# Patient Record
Sex: Female | Born: 1950
Health system: Southern US, Community
[De-identification: ages and names within clinical notes are randomized; demographics above are authoritative.]

## PROBLEM LIST (undated history)

## (undated) DIAGNOSIS — G43909 Migraine, unspecified, not intractable, without status migrainosus: Secondary | ICD-10-CM

## (undated) DIAGNOSIS — M51369 Other intervertebral disc degeneration, lumbar region without mention of lumbar back pain or lower extremity pain: Secondary | ICD-10-CM

## (undated) DIAGNOSIS — M5136 Other intervertebral disc degeneration, lumbar region: Secondary | ICD-10-CM

## (undated) DIAGNOSIS — K219 Gastro-esophageal reflux disease without esophagitis: Secondary | ICD-10-CM

## (undated) DIAGNOSIS — E7800 Pure hypercholesterolemia, unspecified: Secondary | ICD-10-CM

## (undated) DIAGNOSIS — Z98811 Dental restoration status: Secondary | ICD-10-CM

## (undated) DIAGNOSIS — M1990 Unspecified osteoarthritis, unspecified site: Secondary | ICD-10-CM

## (undated) DIAGNOSIS — C50511 Malignant neoplasm of lower-outer quadrant of right female breast: Principal | ICD-10-CM

## (undated) DIAGNOSIS — R29898 Other symptoms and signs involving the musculoskeletal system: Secondary | ICD-10-CM

## (undated) DIAGNOSIS — I480 Paroxysmal atrial fibrillation: Secondary | ICD-10-CM

## (undated) DIAGNOSIS — IMO0002 Reserved for concepts with insufficient information to code with codable children: Secondary | ICD-10-CM

## (undated) DIAGNOSIS — IMO0001 Reserved for inherently not codable concepts without codable children: Secondary | ICD-10-CM

## (undated) DIAGNOSIS — G4730 Sleep apnea, unspecified: Secondary | ICD-10-CM

## (undated) HISTORY — DX: Reserved for inherently not codable concepts without codable children: IMO0001

## (undated) HISTORY — DX: Malignant neoplasm of lower-outer quadrant of right female breast: C50.511

## (undated) HISTORY — PX: DILATION AND CURETTAGE OF UTERUS: SHX78

## (undated) HISTORY — DX: Reserved for concepts with insufficient information to code with codable children: IMO0002

---

## 1997-11-17 ENCOUNTER — Other Ambulatory Visit: Admission: RE | Admit: 1997-11-17 | Discharge: 1997-11-17 | Payer: Self-pay | Admitting: Gynecology

## 1998-12-08 ENCOUNTER — Other Ambulatory Visit: Admission: RE | Admit: 1998-12-08 | Discharge: 1998-12-08 | Payer: Self-pay | Admitting: Gynecology

## 1999-05-30 ENCOUNTER — Other Ambulatory Visit: Admission: RE | Admit: 1999-05-30 | Discharge: 1999-05-30 | Payer: Self-pay | Admitting: Gynecology

## 2000-06-13 ENCOUNTER — Other Ambulatory Visit: Admission: RE | Admit: 2000-06-13 | Discharge: 2000-06-13 | Payer: Self-pay | Admitting: Gynecology

## 2000-07-05 ENCOUNTER — Other Ambulatory Visit: Admission: RE | Admit: 2000-07-05 | Discharge: 2000-07-05 | Payer: Self-pay | Admitting: Gynecology

## 2001-06-17 ENCOUNTER — Other Ambulatory Visit: Admission: RE | Admit: 2001-06-17 | Discharge: 2001-06-17 | Payer: Self-pay | Admitting: Gynecology

## 2001-07-03 ENCOUNTER — Encounter: Admission: RE | Admit: 2001-07-03 | Discharge: 2001-10-01 | Payer: Self-pay | Admitting: Gynecology

## 2002-08-06 ENCOUNTER — Other Ambulatory Visit: Admission: RE | Admit: 2002-08-06 | Discharge: 2002-08-06 | Payer: Self-pay | Admitting: *Deleted

## 2003-04-06 ENCOUNTER — Ambulatory Visit (HOSPITAL_COMMUNITY): Admission: RE | Admit: 2003-04-06 | Discharge: 2003-04-06 | Payer: Self-pay | Admitting: Orthopedic Surgery

## 2003-04-06 ENCOUNTER — Ambulatory Visit (HOSPITAL_BASED_OUTPATIENT_CLINIC_OR_DEPARTMENT_OTHER): Admission: RE | Admit: 2003-04-06 | Discharge: 2003-04-06 | Payer: Self-pay | Admitting: Orthopedic Surgery

## 2003-04-06 HISTORY — PX: KNEE ARTHROSCOPY: SHX127

## 2003-04-21 ENCOUNTER — Ambulatory Visit (HOSPITAL_COMMUNITY): Admission: RE | Admit: 2003-04-21 | Discharge: 2003-04-21 | Payer: Self-pay | Admitting: Orthopedic Surgery

## 2003-05-13 ENCOUNTER — Encounter (INDEPENDENT_AMBULATORY_CARE_PROVIDER_SITE_OTHER): Payer: Self-pay | Admitting: Specialist

## 2003-05-13 ENCOUNTER — Ambulatory Visit (HOSPITAL_COMMUNITY): Admission: RE | Admit: 2003-05-13 | Discharge: 2003-05-13 | Payer: Self-pay | Admitting: Orthopedic Surgery

## 2003-05-13 ENCOUNTER — Ambulatory Visit (HOSPITAL_BASED_OUTPATIENT_CLINIC_OR_DEPARTMENT_OTHER): Admission: RE | Admit: 2003-05-13 | Discharge: 2003-05-13 | Payer: Self-pay | Admitting: Orthopedic Surgery

## 2003-05-13 HISTORY — PX: CARPAL TUNNEL RELEASE: SHX101

## 2003-09-03 ENCOUNTER — Other Ambulatory Visit: Admission: RE | Admit: 2003-09-03 | Discharge: 2003-09-03 | Payer: Self-pay | Admitting: Gynecology

## 2003-11-30 ENCOUNTER — Ambulatory Visit (HOSPITAL_COMMUNITY): Admission: RE | Admit: 2003-11-30 | Discharge: 2003-11-30 | Payer: Self-pay | Admitting: Orthopedic Surgery

## 2003-11-30 ENCOUNTER — Ambulatory Visit (HOSPITAL_BASED_OUTPATIENT_CLINIC_OR_DEPARTMENT_OTHER): Admission: RE | Admit: 2003-11-30 | Discharge: 2003-11-30 | Payer: Self-pay | Admitting: Orthopedic Surgery

## 2003-11-30 HISTORY — PX: KNEE ARTHROSCOPY: SHX127

## 2004-07-08 ENCOUNTER — Ambulatory Visit (HOSPITAL_COMMUNITY): Admission: RE | Admit: 2004-07-08 | Discharge: 2004-07-08 | Payer: Self-pay | Admitting: Gastroenterology

## 2004-09-08 ENCOUNTER — Other Ambulatory Visit: Admission: RE | Admit: 2004-09-08 | Discharge: 2004-09-08 | Payer: Self-pay | Admitting: Gynecology

## 2005-09-11 ENCOUNTER — Other Ambulatory Visit: Admission: RE | Admit: 2005-09-11 | Discharge: 2005-09-11 | Payer: Self-pay | Admitting: Gynecology

## 2006-09-14 ENCOUNTER — Other Ambulatory Visit: Admission: RE | Admit: 2006-09-14 | Discharge: 2006-09-14 | Payer: Self-pay | Admitting: Gynecology

## 2007-10-28 ENCOUNTER — Other Ambulatory Visit: Admission: RE | Admit: 2007-10-28 | Discharge: 2007-10-28 | Payer: Self-pay | Admitting: Gynecology

## 2008-10-26 ENCOUNTER — Other Ambulatory Visit: Admission: RE | Admit: 2008-10-26 | Discharge: 2008-10-26 | Payer: Self-pay | Admitting: Family Medicine

## 2010-09-30 NOTE — Op Note (Signed)
NAME:  Brandi Rodgers, Brandi Rodgers                      ACCOUNT NO.:  [id number]   MEDICAL RECORD NO.:  [id number]                   PATIENT TYPE:  AMB   LOCATION:  DSC                                  FACILITY:  [facility]   PHYSICIAN:  John L. Rendall III, M.D.           DATE OF BIRTH:  [birth year]   DATE OF PROCEDURE:  05/13/2003  DATE OF DISCHARGE:                                 OPERATIVE REPORT   PREOPERATIVE DIAGNOSIS:  Severe right carpal tunnel syndrome.   POSTOPERATIVE DIAGNOSIS:  Severe right carpal tunnel syndrome plus chronic  thickened tenosynovium.   SURGICAL PROCEDURE:  Carpal tunnel release with flexor tenosynovectomy.   SURGEON:  John L. Rendall, M.D.   ANESTHESIA:  Bier block at forearm level.   FINDINGS:  Flattened, erythematous median nerve with thick tenosynovium  around flexor tendons, filling the carpal tunnel.   PROCEDURE:  Under Bier block anesthesia, the right hand and wrist are  prepared with Duraprep.  After 10 minutes of block, a thenar crease incision  is made, curving ulnarwards at the wrist flexion crease and proximally about  5 mm towards the palmaris longus.  Dissection is done with 3.5-power  magnification.  Dissection is carried through skin and subcutaneous tissue  to the flexor retinaculum.  This is split longitudinally, parallel to the  palmaris longus.  A Freer elevator is inserted.  With this in place beneath  the volar carpal ligament, dissection is carried from proximally distally,  splitting subcutaneous tissue and volar carpal ligament.  Once the volar  carpal ligament is split, the median nerve is immediately identified.  It is  flattened and erythematous, encased in a thick tenosynovium.  It is gently  dissected and retracted to the radial side.  A tenosynovectomy is then  carried out to debulk the carpal tunnel space.  Once this is done, the depth  of the carpal tunnel is manually palpated to make sure there are no  obstructing lesions such as a  ganglion.  Significant blockage is felt at the  wrist flexion crease by the proximal flexor retinaculum.  This is then split  subcutaneously an additional 1 inch, with care taken to protect the median  nerve.  Once this is completed, multiple small vessels are cauterized.  The  wrist flexion crease is reapproximated with 3-0 Vicryl.  Skin is then closed  with mattress sutures of 4-0 nylon.  A sterile dressing and volar plaster  splint are approximated after first injecting with Marcaine 0.5% with  morphine.  The patient tolerated the procedure well and returned to recovery  in good condition.                                               John L. Dorothyann Gibbs, M.D.    Renato Gails  D:  05/13/2003  T:  05/13/2003  Job:  [id number]

## 2010-09-30 NOTE — Op Note (Signed)
NAME:  Brandi Rodgers, Brandi Rodgers                      ACCOUNT NO.:  [id number]   MEDICAL RECORD NO.:  [id number]                   PATIENT TYPE:  AMB   LOCATION:  DSC                                  FACILITY:  [facility]   PHYSICIAN:  John L. Rendall III, M.D.           DATE OF BIRTH:  1950/06/15   DATE OF PROCEDURE:  04/06/2003  DATE OF DISCHARGE:                                 OPERATIVE REPORT   PREOPERATIVE DIAGNOSIS:  Torn right medial meniscus.   POSTOPERATIVE DIAGNOSIS:  Complex tear lateral  meniscus and peeling hilum  cartilage from medial femoral condyle.   PROCEDURE:  Arthroscopic medial chondroplasty and lateral  meniscectomy.   SURGEON:  John L. Rendall, M.D.   ANESTHESIA:  MAC.   INDICATIONS FOR PROCEDURE:  Chronic medial right knee pain, crepitation,  grinding, swelling, worse with weightbearing. Justification for outpatient  setting, inpatient not required.   DESCRIPTION OF PROCEDURE:  Under MAC anesthesia the right knee was prepared  with Betadine and draped in a sterile field to the leg holder. Standard  arthroscopic portals were made with findings of over  1 x 1.5 cm patch of peeling hilum cartilage in the central weightbearing  area of the medial femoral condyle.   Using a curved Merlin shaver, this was resected and smoothed back to stable  meniscal cartilage. The meniscal rim underneath was intact. In the lateral  compartment, however, there was a complex posterior  horn tear of the  lateral  meniscus. Using a combination of basket forceps and shaver through  both medial and lateral portals, this was resected back to stable meniscal  rim. The debris was removed with a shaver and irrigation system.   Once this was completed, the knee was copiously irrigated with saline and  infiltrated with Marcaine with morphine with epinephrine and a sterile  compression bandage was applied. The patient returned to recovery in good  condition.                John L. Dorothyann Gibbs, M.D.    Renato Gails  D:  04/06/2003  T:  04/06/2003  Job:  [id number]

## 2010-09-30 NOTE — Op Note (Signed)
NAME:  Brandi Rodgers, Brandi Rodgers                      ACCOUNT NO.:  [id number]   MEDICAL RECORD NO.:  [id number]                   PATIENT TYPE:  AMB   LOCATION:  DSC                                  FACILITY:  [facility]   PHYSICIAN:  John L. Rendall III, M.D.           DATE OF BIRTH:  [birth year]   DATE OF PROCEDURE:  11/30/2003  DATE OF DISCHARGE:                                 OPERATIVE REPORT   INDICATIONS FOR PROCEDURE:  Left knee pain following a twisting injury to  the knee with a feeling of bone grinding off and on for now two months.  She  has medial and lateral joint line pain, crepitance deep in the knee.   PREOPERATIVE DIAGNOSES:  Internal derangement, left knee, with probable  complex tear of both menisci.   OPERATION:  Partial medial and lateral meniscectomy with patellofemoral  chondroplasty.   POSTOPERATIVE DIAGNOSES:  Degenerative tear medial and lateral menisci,  posterior horn, with patellofemoral arthritis.   SURGEON:  John L. Rendall, M.D.   ANESTHESIA:  MAC.   DESCRIPTION OF PROCEDURE:  Under MAC anesthesia, the left knee is prepared  with Betadine and draped as a sterile field with the leg holder.  Standard  arthroscopic portals are made, and the findings of a posterior horn flap  tear of the medial meniscus was found, posterior horn degenerative tear of  the medial meniscus, feeling hyaline cartilage on the medial femoral  condyle, and feeling hyaline cartilage in the femoral trochlea and inferior  and lateral portion of the patella.  Starting laterally, a straight basket  is used and a lateral meniscectomy is carried out in the posterior horn.  An  intra-articular shaver is used to remove debris and to clean out the  compartment.  On the medial compartment, peeling hyaline cartilage is taken  down with the curved Merlin shaver, and the posterior horn is resected back  to stable meniscal rim.  Long fibrillating crabgrass in the base of the  femoral trochlea is  debrided with a especially-curved Merlin shaver.  The  undersurface of the patella is also shaved through both medial and lateral  portals.  Once this is all completed, the punctures are closed with #4-0  nylon.  The knee is infiltrated with Marcaine with morphine with  epinephrine.  A sterile compression bandage is applied.  The patient returned to the recovery room in good condition.                                               John L. Dorothyann Gibbs, M.D.    Renato Gails  D:  11/30/2003  T:  11/30/2003  Job:  [id number]

## 2011-03-17 ENCOUNTER — Encounter: Payer: Self-pay | Admitting: Gynecology

## 2012-01-12 ENCOUNTER — Other Ambulatory Visit: Payer: Self-pay | Admitting: Family Medicine

## 2012-01-12 ENCOUNTER — Other Ambulatory Visit (HOSPITAL_COMMUNITY)
Admission: RE | Admit: 2012-01-12 | Discharge: 2012-01-12 | Disposition: A | Discharge status: Home / Self Care | Payer: BC Managed Care – PPO | Source: Ambulatory Visit | Attending: Family Medicine | Admitting: Family Medicine

## 2012-01-12 DIAGNOSIS — Z124 Encounter for screening for malignant neoplasm of cervix: Secondary | ICD-10-CM | POA: Insufficient documentation

## 2012-01-12 DIAGNOSIS — Z1151 Encounter for screening for human papillomavirus (HPV): Secondary | ICD-10-CM | POA: Insufficient documentation

## 2012-06-11 ENCOUNTER — Other Ambulatory Visit: Payer: Self-pay | Admitting: Orthopaedic Surgery

## 2012-06-11 DIAGNOSIS — M545 Low back pain, unspecified: Secondary | ICD-10-CM

## 2012-06-18 ENCOUNTER — Ambulatory Visit
Admission: RE | Admit: 2012-06-18 | Discharge: 2012-06-18 | Disposition: A | Discharge status: Home / Self Care | Payer: BC Managed Care – PPO | Source: Ambulatory Visit | Attending: Orthopaedic Surgery | Admitting: Orthopaedic Surgery

## 2012-06-18 DIAGNOSIS — M545 Low back pain, unspecified: Secondary | ICD-10-CM

## 2012-10-21 ENCOUNTER — Other Ambulatory Visit: Payer: Self-pay | Admitting: Dermatology

## 2014-01-29 ENCOUNTER — Emergency Department (HOSPITAL_COMMUNITY)
Admission: EM | Admit: 2014-01-29 | Discharge: 2014-01-29 | Disposition: A | Discharge status: Home / Self Care | Payer: BC Managed Care – PPO | Attending: Emergency Medicine | Admitting: Emergency Medicine

## 2014-01-29 ENCOUNTER — Emergency Department (HOSPITAL_COMMUNITY): Payer: BC Managed Care – PPO

## 2014-01-29 ENCOUNTER — Encounter (HOSPITAL_COMMUNITY): Payer: Self-pay | Admitting: Emergency Medicine

## 2014-01-29 DIAGNOSIS — Z8679 Personal history of other diseases of the circulatory system: Secondary | ICD-10-CM | POA: Diagnosis not present

## 2014-01-29 DIAGNOSIS — M7989 Other specified soft tissue disorders: Secondary | ICD-10-CM | POA: Insufficient documentation

## 2014-01-29 DIAGNOSIS — R002 Palpitations: Secondary | ICD-10-CM

## 2014-01-29 DIAGNOSIS — Z9889 Other specified postprocedural states: Secondary | ICD-10-CM | POA: Diagnosis not present

## 2014-01-29 LAB — CBC
HCT: 39.5 % (ref 36.0–46.0)
HEMOGLOBIN: 13.4 g/dL (ref 12.0–15.0)
MCH: 29.3 pg (ref 26.0–34.0)
MCHC: 33.9 g/dL (ref 30.0–36.0)
MCV: 86.4 fL (ref 78.0–100.0)
Platelets: 208 10*3/uL (ref 150–400)
RBC: 4.57 MIL/uL (ref 3.87–5.11)
RDW: 14 % (ref 11.5–15.5)
WBC: 8.4 10*3/uL (ref 4.0–10.5)

## 2014-01-29 LAB — BASIC METABOLIC PANEL
ANION GAP: 10 (ref 5–15)
BUN: 12 mg/dL (ref 6–23)
CALCIUM: 10 mg/dL (ref 8.4–10.5)
CHLORIDE: 107 meq/L (ref 96–112)
CO2: 30 meq/L (ref 19–32)
Creatinine, Ser: 0.7 mg/dL (ref 0.50–1.10)
GFR calc Af Amer: >90 mL/min (ref >90)
GFR calc non Af Amer: >90 mL/min (ref >90)
GLUCOSE: 113 mg/dL — AB (ref 70–99)
Potassium: 4.4 mEq/L (ref 3.7–5.3)
SODIUM: 147 meq/L (ref 137–147)

## 2014-01-29 LAB — TROPONIN I

## 2014-01-29 NOTE — ED Notes (Signed)
Pt. Given a box lunch and diet coke

## 2014-01-29 NOTE — ED Provider Notes (Signed)
63 year old female has been treated for vertigo and was in seeing her doctor today when she noted that her heart was beating rapidly. Physician did an ECG which showed atrial fibrillation and she was referred to the ED. Rapid heartbeat stopped before she left the office. She states that these episodes will happen a couple of times a year and her last a few minutes at a time. She has been evaluated in the past with workup being negative. Also, she recently returned from a 4 hour car trip to the beach. She has chronic edema in her legs and the left leg is always somewhat discolored. On exam, lungs are clear and heart has regular rate and rhythm. She has 1-2+ pitting edema to her legs but the left leg is mildly erythematous and warm. There is perhaps a 1 cm difference in calf circumference with side being larger than the right. She will be sent for venous Doppler to rule out DVT. No treatment is needed for her atrial fibrillation since that spontaneously converted. Recommended that she have a followup appointment with her cardiologist.  I saw and evaluated the patient, reviewed the resident's note and I agree with the findings and plan.   EKG Interpretation   Date/Time:  Thursday January 29 2014 13:07:51 EDT Ventricular Rate:  60 PR Interval:  152 QRS Duration: 82 QT Interval:  360 QTC Calculation: 360 R Axis:   -4 Text Interpretation:  Normal sinus rhythm Minimal voltage criteria for  LVH, may be normal variant Borderline ECG When compared with ECG of  04/02/2003, No significant change was found Confirmed by [facility]  MD, Keyle Doby  ([id number]) on 01/29/2014 5:33:14 PM        Delora Fuel, MD 01/60/10 [id number]

## 2014-01-29 NOTE — ED Provider Notes (Signed)
CSN: [id number]     Arrival date & time 01/29/14  1249 History   First MD Initiated Contact with Patient 01/29/14 1550     Chief Complaint  Patient presents with  . Palpitations    Patient is a 63 y.o. female presenting with palpitations. The history is provided by the patient.  Palpitations Palpitations quality:  Fast Onset quality:  Sudden Timing:  Intermittent Progression:  Resolved Chronicity:  Recurrent Context: caffeine (drank alot mroe sweet tea recently which has caused her to do this previously)   Context: not anxiety, not appetite suppressants, not illicit drugs, not nicotine and not stimulant use   Associated symptoms: lower extremity edema (had long car ride recently got back yesterday, and elft leg is swollen which is not new but did get worse since trip. No calf pain.)   Associated symptoms: no chest pain, no chest pressure, no cough, no diaphoresis, no hemoptysis, no nausea, no near-syncope, no numbness, no shortness of breath, no syncope and no vomiting   Risk factors: no hx of DVT and no hx of PE    Hx of a fib  Saw PCP for vertigo is better, turns to left and room spins which started 4 days ago.  Meclizine prescribed today but has not taken.  Told PCP her heart was racing at 0800.  EKG was a fib 101 rate.  Stopped at [facility].  Has history of very similar episodes.  Had testing for this 9 years ago, unknown results. Had ECHO presumed normal per patient, unsure?    Past Medical History  Diagnosis Date  . Paroxysmal a-fib    Past Surgical History  Procedure Laterality Date  . Knee surgery    . Hand surgery     No family history on file. History  Substance Use Topics  . Smoking status: Never Smoker   . Smokeless tobacco: Not on file  . Alcohol Use: Yes     Comment: occ   OB History   Grav Para Term Preterm Abortions TAB SAB Ect Mult Living                 Review of Systems  Constitutional: Negative for fever, diaphoresis, activity change and appetite change.   Respiratory: Negative for cough, hemoptysis, chest tightness and shortness of breath.   Cardiovascular: Positive for palpitations and leg swelling. Negative for chest pain, syncope and near-syncope.  Gastrointestinal: Negative for nausea, vomiting, abdominal pain and diarrhea.  Genitourinary: Negative for dysuria.  Skin: Negative for color change and wound.  Neurological: Negative for syncope, speech difficulty, weakness, light-headedness, numbness and headaches.  All other systems reviewed and are negative.   Allergies  Review of patient's allergies indicates no known allergies.  Home Medications   Prior to Admission medications   Not on File   BP 144/73  Pulse 61  Temp(Src) 98.3 F (36.8 C) (Oral)  Resp 16  Ht 5\' 8"  (1.727 m)  Wt 289 lb (131.09 kg)  BMI 43.95 kg/m2  SpO2 96% Physical Exam  Nursing note and vitals reviewed. Constitutional: She is oriented to person, place, and time. She appears well-developed and well-nourished. No distress.  HENT:  Head: Normocephalic and atraumatic.  Nose: Nose normal.  Eyes: Conjunctivae are normal. Pupils are equal, round, and reactive to light.  Neck: Normal range of motion. Neck supple. No JVD present. No tracheal deviation present.  Cardiovascular: Normal rate, regular rhythm, normal heart sounds and intact distal pulses.   No murmur heard. Pulmonary/Chest: Effort normal and breath  sounds normal. No respiratory distress. She has no wheezes. She has no rales. She exhibits no tenderness.  Abdominal: Soft. Bowel sounds are normal. She exhibits no distension and no mass. There is no tenderness.  Musculoskeletal: Normal range of motion. She exhibits no edema.  Bil lower extremity edema with equal 1+ pitting edema No calf tenderness, warmth, erythema or palpable cords  Neurological: She is alert and oriented to person, place, and time.  Skin: Skin is warm and dry. No rash noted. She is not diaphoretic.  Psychiatric: She has a normal  mood and affect.    ED Course  Procedures (including critical care time) Labs Review Labs Reviewed  BASIC METABOLIC PANEL - Abnormal; Notable for the following:    Glucose, Bld 113 (*)    All other components within normal limits  CBC  TROPONIN I    Imaging Review Dg Chest 2 View  01/29/2014   CLINICAL DATA:  Palpitations  EXAM: CHEST  2 VIEW  COMPARISON:  None.  FINDINGS: Cardiac contours upper limits of normal. Tortuosity of the thoracic aorta. Bilateral perihilar interstitial pulmonary opacities. Right hilar fullness. No large consolidative pulmonary opacity. No pleural effusion or pneumothorax. Regional skeleton is unremarkable.  IMPRESSION: Bilateral perihilar interstitial opacities may represent mild interstitial pulmonary edema.  Right hilar fullness, potentially secondary to engorgement of the right pulmonary vasculature however underlying mass or adenopathy are not excluded. Consider short-term followup radiograph upon resolution of the acute symptomatology. If this persists, chest CT would be warranted.   Electronically Signed   By: Lovey Newcomer M.D.   On: 01/29/2014 17:38     EKG Interpretation   Date/Time:  Thursday January 29 2014 13:07:51 EDT Ventricular Rate:  60 PR Interval:  152 QRS Duration: 82 QT Interval:  360 QTC Calculation: 360 R Axis:   -4 Text Interpretation:  Normal sinus rhythm Minimal voltage criteria for  LVH, may be normal variant Borderline ECG When compared with ECG of  04/02/2003, No significant change was found Confirmed by [facility]  MD, DAVID  ([id number]) on 01/29/2014 5:33:14 PM      MDM   Final diagnoses:  Palpitations   Palpitations without EKG changes. Rate controlled, NSR, no ischemia.  No electrolyte derangement.  A fib may be caused by recent diet change.  No other risk factors elicited on history. Have low suspicion for DVT in LE as pt says lower extremity edema is chronic and not new. No calf tenderness or warmth. No PE signs, no  tachypnea, no tachycardia, no hypoxia, no hemoptysis.   VSS. Well appearing. Currently asymptomatic. Pt will see PCP tomorrow.     Tammy Sours, MD 01/30/14 [phone]

## 2014-01-29 NOTE — Discharge Instructions (Signed)
Atrial Fibrillation  Atrial fibrillation is a condition that causes your heart to beat irregularly. It may also cause your heart to beat faster than normal. Atrial fibrillation can prevent your heart from pumping blood normally. It increases your risk of stroke and heart problems.  HOME CARE  · Take medications as told by your doctor.  · Only take medications that your doctor says are safe. Some medications can make the condition worse or happen again.  · If blood thinners were prescribed by your doctor, take them exactly as told. Too much can cause bleeding. Too little and you will not have the needed protection against stroke and other problems.  · Perform blood tests at home if told by your doctor.  · Perform blood tests exactly as told by your doctor.  · Do not drink alcohol.  · Do not drink beverages with caffeine such as coffee, soda, and some teas.  · Maintain a healthy weight.  · Do not use diet pills unless your doctor says they are safe. They may make heart problems worse.  · Follow diet instructions as told by your doctor.  · Exercise regularly as told by your doctor.  · Keep all follow-up appointments.  GET HELP IF:  · You notice a change in the speed, rhythm, or strength of your heartbeat.  · You suddenly begin peeing (urinating) more often.  · You get tired more easily when moving or exercising.  GET HELP RIGHT AWAY IF:   · You have chest or belly (abdominal) pain.  · You feel sick to your stomach (nauseous).  · You are short of breath.  · You suddenly have swollen feet and ankles.  · You feel dizzy.  · You face, arms, or legs feel numb or weak.  · There is a change in your vision or speech.  MAKE SURE YOU:   · Understand these instructions.  · Will watch your condition.  · Will get help right away if you are not doing well or get worse.  Document Released: 02/08/2008 Document Revised: 09/15/2013 Document Reviewed: 06/11/2012  [employer]® Patient Information ©2015 [employer]. This information is not  intended to replace advice given to you by your health care provider. Make sure you discuss any questions you have with your health care provider.

## 2014-02-26 ENCOUNTER — Other Ambulatory Visit: Payer: Self-pay | Admitting: Orthopedic Surgery

## 2014-02-26 NOTE — Progress Notes (Signed)
Preoperative surgical orders have been place into the Epic hospital system for Brandi Rodgers on 02/26/2014, 1:38 PM  by Mickel Crow for surgery on 03/23/2014.  Preop Total Knee orders including Experal, IV Tylenol, and IV Decadron as long as there are no contraindications to the above medications. [name], PA-C

## 2014-03-06 ENCOUNTER — Ambulatory Visit (INDEPENDENT_AMBULATORY_CARE_PROVIDER_SITE_OTHER): Payer: BC Managed Care – PPO | Admitting: Cardiology

## 2014-03-06 VITALS — BP 148/92 | HR 59 | Ht 68.0 in | Wt 291.0 lb

## 2014-03-06 DIAGNOSIS — I10 Essential (primary) hypertension: Secondary | ICD-10-CM | POA: Insufficient documentation

## 2014-03-06 DIAGNOSIS — M8949 Other hypertrophic osteoarthropathy, multiple sites: Secondary | ICD-10-CM

## 2014-03-06 DIAGNOSIS — M1990 Unspecified osteoarthritis, unspecified site: Secondary | ICD-10-CM | POA: Insufficient documentation

## 2014-03-06 DIAGNOSIS — M150 Primary generalized (osteo)arthritis: Secondary | ICD-10-CM

## 2014-03-06 DIAGNOSIS — G43909 Migraine, unspecified, not intractable, without status migrainosus: Secondary | ICD-10-CM | POA: Insufficient documentation

## 2014-03-06 DIAGNOSIS — I480 Paroxysmal atrial fibrillation: Secondary | ICD-10-CM

## 2014-03-06 DIAGNOSIS — E7800 Pure hypercholesterolemia, unspecified: Secondary | ICD-10-CM | POA: Insufficient documentation

## 2014-03-06 DIAGNOSIS — G43009 Migraine without aura, not intractable, without status migrainosus: Secondary | ICD-10-CM

## 2014-03-06 DIAGNOSIS — M159 Polyosteoarthritis, unspecified: Secondary | ICD-10-CM

## 2014-03-06 MED ORDER — APIXABAN 5 MG PO TABS: 5.0000 mg | ORAL_TABLET | Freq: Two times a day (BID) | ORAL | Status: DC

## 2014-03-06 NOTE — Patient Instructions (Addendum)
START ELIQUIS 5 MG TWICE A DAY  Your physician has requested that you have an echocardiogram. Echocardiography is a painless test that uses sound waves to create images of your heart. It provides your doctor with information about the size and shape of your heart and how well your heart's chambers and valves are working. This procedure takes approximately one hour. There are no restrictions for this procedure.  AVOID CAFFEINE  Your physician recommends that you schedule a follow-up appointment in: 2 MONTH OV/EKG   Atrial Fibrillation Atrial fibrillation is a type of irregular heart rhythm (arrhythmia). During atrial fibrillation, the upper chambers of the heart (atria) quiver continuously in a chaotic pattern. This causes an irregular and often rapid heart rate.  Atrial fibrillation is the result of the heart becoming overloaded with disorganized signals that tell it to beat. These signals are normally released one at a time by a part of the right atrium called the sinoatrial node. They then travel from the atria to the lower chambers of the heart (ventricles), causing the atria and ventricles to contract and pump blood as they pass. In atrial fibrillation, parts of the atria outside of the sinoatrial node also release these signals. This results in two problems. First, the atria receive so many signals that they do not have time to fully contract. Second, the ventricles, which can only receive one signal at a time, beat irregularly and out of rhythm with the atria.  There are three types of atrial fibrillation:   Paroxysmal. Paroxysmal atrial fibrillation starts suddenly and stops on its own within a week.  Persistent. Persistent atrial fibrillation lasts for more than a week. It may stop on its own or with treatment.  Permanent. Permanent atrial fibrillation does not go away. Episodes of atrial fibrillation may lead to permanent atrial fibrillation. Atrial fibrillation can prevent your heart from  pumping blood normally. It increases your risk of stroke and can lead to heart failure.  CAUSES   Heart conditions, including a heart attack, heart failure, coronary artery disease, and heart valve conditions.   Inflammation of the sac that surrounds the heart (pericarditis).  Blockage of an artery in the lungs (pulmonary embolism).  Pneumonia or other infections.  Chronic lung disease.  Thyroid problems, especially if the thyroid is overactive (hyperthyroidism).  Caffeine, excessive alcohol use, and use of some illegal drugs.   Use of some medicines, including certain decongestants and diet pills.  Heart surgery.   Birth defects.  Sometimes, no cause can be found. When this happens, the atrial fibrillation is called lone atrial fibrillation. The risk of complications from atrial fibrillation increases if you have lone atrial fibrillation and you are age 63 years or older. RISK FACTORS  Heart failure.  Coronary artery disease.  Diabetes mellitus.   High blood pressure (hypertension).   Obesity.   Other arrhythmias.   Increased age. SIGNS AND SYMPTOMS   A feeling that your heart is beating rapidly or irregularly.   A feeling of discomfort or pain in your chest.   Shortness of breath.   Sudden light-headedness or weakness.   Getting tired easily when exercising.   Urinating more often than normal (mainly when atrial fibrillation first begins).  In paroxysmal atrial fibrillation, symptoms may start and suddenly stop. DIAGNOSIS  Your health care provider may be able to detect atrial fibrillation when taking your pulse. Your health care provider may have you take a test called an ambulatory electrocardiogram (ECG). An ECG records your heartbeat patterns over  a 24-hour period. You may also have other tests, such as:  Transthoracic echocardiogram (TTE). During echocardiography, sound waves are used to evaluate how blood flows through your  heart.  Transesophageal echocardiogram (TEE).  Stress test. There is more than one type of stress test. If a stress test is needed, ask your health care provider about which type is best for you.  Chest X-ray exam.  Blood tests.  Computed tomography (CT). TREATMENT  Treatment may include:  Treating any underlying conditions. For example, if you have an overactive thyroid, treating the condition may correct atrial fibrillation.  Taking medicine. Medicines may be given to control a rapid heart rate or to prevent blood clots, heart failure, or a stroke.  Having a procedure to correct the rhythm of the heart:  Electrical cardioversion. During electrical cardioversion, a controlled, low-energy shock is delivered to the heart through your skin. If you have chest pain, very low blood pressure, or sudden heart failure, this procedure may need to be done as an emergency.  Catheter ablation. During this procedure, heart tissues that send the signals that cause atrial fibrillation are destroyed.  Surgical ablation. During this surgery, thin lines of heart tissue that carry the abnormal signals are destroyed. This procedure can either be an open-heart surgery or a minimally invasive surgery. With the minimally invasive surgery, small cuts are made to access the heart instead of a large opening.  Pulmonary venous isolation. During this surgery, tissue around the veins that carry blood from the lungs (pulmonary veins) is destroyed. This tissue is thought to carry the abnormal signals. HOME CARE INSTRUCTIONS   Take medicines only as directed by your health care provider. Some medicines can make atrial fibrillation worse or recur.  If blood thinners were prescribed by your health care provider, take them exactly as directed. Too much blood-thinning medicine can cause bleeding. If you take too little, you will not have the needed protection against stroke and other problems.  Perform blood tests at  home if directed by your health care provider. Perform blood tests exactly as directed.  Quit smoking if you smoke.  Do not drink alcohol.  Do not drink caffeinated beverages such as coffee, soda, and some teas. You may drink decaffeinated coffee, soda, or tea.   Maintain a healthy weight.Do not use diet pills unless your health care provider approves. They may make heart problems worse.   Follow diet instructions as directed by your health care provider.  Exercise regularly as directed by your health care provider.  Keep all follow-up visits as directed by your health care provider. This is important. PREVENTION  The following substances can cause atrial fibrillation to recur:   Caffeinated beverages.  Alcohol.  Certain medicines, especially those used for breathing problems.  Certain herbs and herbal medicines, such as those containing ephedra or ginseng.  Illegal drugs, such as cocaine and amphetamines. Sometimes medicines are given to prevent atrial fibrillation from recurring. Proper treatment of any underlying condition is also important in helping prevent recurrence.  SEEK MEDICAL CARE IF:  You notice a change in the rate, rhythm, or strength of your heartbeat.  You suddenly begin urinating more frequently.  You tire more easily when exerting yourself or exercising. SEEK IMMEDIATE MEDICAL CARE IF:   You have chest pain, abdominal pain, sweating, or weakness.  You feel nauseous.  You have shortness of breath.  You suddenly have swollen feet and ankles.  You feel dizzy.  Your face or limbs feel numb or weak.  You have a change in your vision or speech. MAKE SURE YOU:   Understand these instructions.  Will watch your condition.  Will get help right away if you are not doing well or get worse. Document Released: 05/01/2005 Document Revised: 09/15/2013 Document Reviewed: 06/11/2012 [facility] Patient Information 2015 [place], [place]. This information is  not intended to replace advice given to you by your health care provider. Make sure you discuss any questions you have with your health care provider.

## 2014-03-06 NOTE — Progress Notes (Signed)
Brandi Rodgers Date of Birth:  1950-05-31 [name], [name]  [address] [phone]        Fax   [phone]   History of Present Illness: This pleasant 63 year old woman is seen by me for the first time today.  She is seen at the request of Dr. Jonathon Jordan for evaluation of paroxysmal atrial fibrillation.  The patient has a history of intermittent episodes of rapid irregular heart rate dating back 10 years.  The episodes occur infrequently.  On 01/29/14 she was seen at her PCP office and had documented atrial fibrillation with a ventricular rate of 111.  I reviewed  the electrocardiogram and concur with the interpretation.  That was her last episode of atrial fibrillation.  Normally she has about 1 episode or less per month.  Each episode typically lasts several hours.  She normally will take an aspirin and will rest. Her most recent episode of atrial fibrillation on 01/29/14 occurred after a trip to the beach with some friends.  She drank a lot of caffeinated drinks and chocolate prior to the onset of the atrial fibrillation on medication. The patient does not have any history of ischemic heart disease.  She does not have any history of exertional chest pain.  She has had a history of recent mild peripheral edema and her dermatologist recently gave her some Lasix to have on hand. The patient has a history of migraine headaches and is followed at the headache clinic.  She is on gabapentin for prevention of headaches. The patient has a history of osteoarthritis of the knees and is scheduled for a left total knee replacement by Dr. Wynelle Link on March 23, 2014. Her family history reveals that both parents are deceased.  Her father died at 12 of COPD and her mother died at 26 of COPD but also had atrial fibrillation.  Current Outpatient Prescriptions  Medication Sig Dispense Refill  . acetaminophen-codeine (TYLENOL #3) 300-30 MG per tablet         . atenolol (TENORMIN) 50 MG tablet Take 1 tablet by mouth daily.      [name] BIOTIN 5000 PO Take 1 tablet by mouth daily.      . Calcium-Vitamin D-Vitamin K (VIACTIV PO) Take 1 tablet by mouth daily.      . furosemide (LASIX) 20 MG tablet Take 20 mg by mouth as needed.      . gabapentin (NEURONTIN) 600 MG tablet Take 600 mg by mouth 3 (three) times daily.      . Multiple Vitamin (MULTIVITAMIN) tablet Take 1 tablet by mouth daily.      . Polyvinyl Alcohol-Povidone (REFRESH OP) Apply 1 drop to eye 2 (two) times daily as needed.      . rizatriptan (MAXALT-MLT) 10 MG disintegrating tablet Take 10 mg by mouth as needed for migraine. May repeat in 2 hours if needed      . apixaban (ELIQUIS) 5 MG TABS tablet Take 1 tablet (5 mg total) by mouth 2 (two) times daily.  60 tablet  5   No current facility-administered medications for this visit.    No Known Allergies  Patient Active Problem List   Diagnosis Date Noted  . Paroxysmal atrial fibrillation 03/06/2014  . Essential hypertension 03/06/2014  . Hypercholesterolemia 03/06/2014  . Osteoarthritis 03/06/2014  . Migraine headache 03/06/2014    History  Smoking status  . Never Smoker   Smokeless tobacco  . Not on file  History  Alcohol Use  . Yes    Comment: occ      Review of Systems: Constitutional: no fever chills diaphoresis or fatigue or change in weight.  Head and neck: no hearing loss, no epistaxis, no photophobia or visual disturbance. Respiratory: No cough, shortness of breath or wheezing. Cardiovascular: No chest pain peripheral edema, positive for intermittent palpitations Gastrointestinal: No abdominal distention, no abdominal pain, no change in bowel habits hematochezia or melena. Genitourinary: No dysuria, no frequency, no urgency, no nocturia. Musculoskeletal:No arthralgias, no back pain, no gait disturbance or myalgias. Neurological: No dizziness, no headaches, no numbness, no seizures, no syncope, no weakness, no  tremors. Hematologic: No lymphadenopathy, no easy bruising. Psychiatric: No confusion, no hallucinations, no sleep disturbance.    Physical Exam: Filed Vitals:   03/06/14 0947  BP: 148/92  Pulse: 59  The patient appears to be in no distress.  Head and neck exam reveals that the pupils are equal and reactive.  The extraocular movements are full.  There is no scleral icterus.  Mouth and pharynx are benign.  No lymphadenopathy.  No carotid bruits.  The jugular venous pressure is normal.  Thyroid is not enlarged or tender.  Chest is clear to percussion and auscultation.  No rales or rhonchi.  Expansion of the chest is symmetrical.  Heart reveals no abnormal lift or heave.  First and second heart sounds are normal.  There is no murmur gallop rub or click.  The pulse is regular  The abdomen is soft and nontender.  Bowel sounds are normoactive.  There is no hepatosplenomegaly or mass.  There are no abdominal bruits.  Extremities reveal no phlebitis.  There is mild bilateral peripheral edema. Pedal pulses are good.  There is no cyanosis or clubbing.  Neurologic exam is normal strength and no lateralizing weakness.  No sensory deficits.  Integument reveals no rash  EKG today shows sinus bradycardia at 59 per minute and is within normal limits otherwise.  Assessment / Plan: 1. paroxysmal atrial fibrillation documented by EKG on 01/29/14 at PCP office. Chadssvasc score of at least 3. 2. osteoarthritis of the knee, scheduled for left total knee replacement November 01/2014 3. Hyperlipidemia 4. Obesity 5. essential hypertension 6. peripheral edema  Disposition: We will start her on Apixaban 5 mg twice a day.  She has normal renal function.  We will obtain a two-dimensional echocardiogram.  The patient was advised to avoid caffeine which seems to be a trigger for her atrial fibrillation.  She will continue atenolol and as needed Lasix. We will await the results of her echocardiogram but at this  point I do not see any contraindication to proceeding with plans for left total knee replacement. I have asked her to let me check her back in about 2 months for followup office visit and EKG. Many thanks for the opportunity to see this pleasant woman with you.

## 2014-03-10 ENCOUNTER — Telehealth: Payer: Self-pay | Admitting: *Deleted

## 2014-03-10 ENCOUNTER — Ambulatory Visit
Admission: RE | Admit: 2014-03-10 | Discharge: 2014-03-10 | Disposition: A | Discharge status: Home / Self Care | Payer: BC Managed Care – PPO | Source: Ambulatory Visit | Attending: Family Medicine | Admitting: Family Medicine

## 2014-03-10 ENCOUNTER — Other Ambulatory Visit: Payer: Self-pay | Admitting: Family Medicine

## 2014-03-10 ENCOUNTER — Ambulatory Visit (HOSPITAL_COMMUNITY): Payer: BC Managed Care – PPO | Attending: Cardiology

## 2014-03-10 DIAGNOSIS — I480 Paroxysmal atrial fibrillation: Secondary | ICD-10-CM | POA: Diagnosis not present

## 2014-03-10 DIAGNOSIS — I10 Essential (primary) hypertension: Secondary | ICD-10-CM | POA: Diagnosis not present

## 2014-03-10 DIAGNOSIS — R9389 Abnormal findings on diagnostic imaging of other specified body structures: Secondary | ICD-10-CM

## 2014-03-10 DIAGNOSIS — E785 Hyperlipidemia, unspecified: Secondary | ICD-10-CM | POA: Diagnosis not present

## 2014-03-10 NOTE — Telephone Encounter (Signed)
Advised of results  [place] for surgery on 03/23/14 and last dose of Eliquis on evening of 03/20/14, per  Dr. Mare Ferrari  Advised patient

## 2014-03-10 NOTE — Progress Notes (Signed)
2D Echo completed. 03/10/2014

## 2014-03-10 NOTE — Telephone Encounter (Signed)
Message copied by Earvin Hansen on Tue Mar 10, 2014  5:58 PM ------      Message from: Darlin Coco      Created: Tue Mar 10, 2014 12:35 PM       Please report.  The echocardiogram was normal. ------

## 2014-03-11 ENCOUNTER — Telehealth: Payer: Self-pay | Admitting: Cardiology

## 2014-03-11 NOTE — Telephone Encounter (Signed)
Received request from Nurse fax box, documents faxed for surgical clearance. To: [employer] Fax number: [phone] Attention: [phone]/km

## 2014-03-12 ENCOUNTER — Encounter (HOSPITAL_COMMUNITY): Payer: Self-pay | Admitting: Pharmacy Technician

## 2014-03-13 ENCOUNTER — Other Ambulatory Visit (HOSPITAL_COMMUNITY): Payer: Self-pay | Admitting: *Deleted

## 2014-03-13 NOTE — Patient Instructions (Addendum)
[place]  03/13/2014   Your procedure is scheduled on: Monday NOVEMBER 9TH, 2015  Report to [facility]  Entrance and follow signs to  [place] at 600 AM.  Call this number if you have problems the morning of surgery [phone]   Remember: BRING MOUTHGUARD  Do not eat food or drink liquids :After Midnight.     Take these medicines the morning of surgery with A SIP OF WATER: Gabapentin: Refresh eye drops if needed (bring with you day of surgery);Atenolol (Tenormin)                               You may not have any metal on your body including hair pins and piercings  Do not wear jewelry, make-up, lotions, powders, or deodorant.  May shave up to 2 days prior to surgery.  Do not bring valuables to the hospital. [place].  Contacts, dentures or bridgework may not be worn into surgery.  Leave suitcase in the car. After surgery it may be brought to your room.  For patients admitted to the hospital, checkout time is 11:00 AM the day of discharge.    Special Instructions: MRSA Instructions;Blood Transfusion Facts Sheet;Incentive Spirometer ________________________________________________________________________  [facility] - Preparing for Surgery Before surgery, you can play an important role.  Because skin is not sterile, your skin needs to be as free of germs as possible.  You can reduce the number of germs on your skin by washing with CHG (chlorahexidine gluconate) soap before surgery.  CHG is an antiseptic cleaner which kills germs and bonds with the skin to continue killing germs even after washing. Please DO NOT use if you have an allergy to CHG or antibacterial soaps.  If your skin becomes reddened/irritated stop using the CHG and inform your nurse when you arrive at Short Stay. Do not shave (including legs and underarms) for at least 48 hours prior to the first CHG shower.  You may shave your face/neck. Please follow  these instructions carefully:  1.  Shower with CHG Soap the night before surgery and the  morning of Surgery.  2.  If you choose to wash your hair, wash your hair first as usual with your  normal  shampoo.  3.  After you shampoo, rinse your hair and body thoroughly to remove the  shampoo.                           4.  Use CHG as you would any other liquid soap.  You can apply chg directly  to the skin and wash                       Gently with a scrungie or clean washcloth.  5.  Apply the CHG Soap to your body ONLY FROM THE NECK DOWN.   Do not use on face/ open                           Wound or open sores. Avoid contact with eyes, ears mouth and genitals (private parts).                       Wash face,  Genitals (private parts) with your normal soap.  6.  Wash thoroughly, paying special attention to the area where your surgery  will be performed.  7.  Thoroughly rinse your body with warm water from the neck down.  8.  DO NOT shower/wash with your normal soap after using and rinsing off  the CHG Soap.                9.  Pat yourself dry with a clean towel.            10.  Wear clean pajamas.            11.  Place clean sheets on your bed the night of your first shower and do not  sleep with pets. Day of Surgery : Do not apply any lotions/deodorants the morning of surgery.  Please wear clean clothes to the hospital/surgery center.  FAILURE TO FOLLOW THESE INSTRUCTIONS MAY RESULT IN THE CANCELLATION OF YOUR SURGERY PATIENT SIGNATURE_________________________________  NURSE SIGNATURE__________________________________  ________________________________________________________________________   Adam Phenix  An incentive spirometer is a tool that can help keep your lungs clear and active. This tool measures how well you are filling your lungs with each breath. Taking long deep breaths may help reverse or decrease the chance of developing breathing (pulmonary) problems  (especially infection) following:  A long period of time when you are unable to move or be active. BEFORE THE PROCEDURE   If the spirometer includes an indicator to show your best effort, your nurse or respiratory therapist will set it to a desired goal.  If possible, sit up straight or lean slightly forward. Try not to slouch.  Hold the incentive spirometer in an upright position. INSTRUCTIONS FOR USE  1. Sit on the edge of your bed if possible, or sit up as far as you can in bed or on a chair. 2. Hold the incentive spirometer in an upright position. 3. Breathe out normally. 4. Place the mouthpiece in your mouth and seal your lips tightly around it. 5. Breathe in slowly and as deeply as possible, raising the piston or the ball toward the top of the column. 6. Hold your breath for 3-5 seconds or for as long as possible. Allow the piston or ball to fall to the bottom of the column. 7. Remove the mouthpiece from your mouth and breathe out normally. 8. Rest for a few seconds and repeat Steps 1 through 7 at least 10 times every 1-2 hours when you are awake. Take your time and take a few normal breaths between deep breaths. 9. The spirometer may include an indicator to show your best effort. Use the indicator as a goal to work toward during each repetition. 10. After each set of 10 deep breaths, practice coughing to be sure your lungs are clear. If you have an incision (the cut made at the time of surgery), support your incision when coughing by placing a pillow or rolled up towels firmly against it. Once you are able to get out of bed, walk around indoors and cough well. You may stop using the incentive spirometer when instructed by your caregiver.  RISKS AND COMPLICATIONS  Take your time so you do not get dizzy or light-headed.  If you are in pain, you may need to take or ask for pain medication before doing incentive spirometry. It is harder to take a deep breath if you are having  pain. AFTER USE  Rest and breathe slowly and easily.  It can be helpful to keep track of a log of  your progress. Your caregiver can provide you with a simple table to help with this. If you are using the spirometer at home, follow these instructions: [place] IF:   You are having difficultly using the spirometer.  You have trouble using the spirometer as often as instructed.  Your pain medication is not giving enough relief while using the spirometer.  You develop fever of 100.5 F (38.1 C) or higher. SEEK IMMEDIATE MEDICAL CARE IF:   You cough up bloody sputum that had not been present before.  You develop fever of 102 F (38.9 C) or greater.  You develop worsening pain at or near the incision site. MAKE SURE YOU:   Understand these instructions.  Will watch your condition.  Will get help right away if you are not doing well or get worse. Document Released: 09/11/2006 Document Revised: 07/24/2011 Document Reviewed: 11/12/2006 ExitCare Patient Information 2014 Exit[employer], [place].   ________________________________________________________________________  WHAT IS A BLOOD TRANSFUSION? Blood Transfusion Information  A transfusion is the replacement of blood or some of its parts. Blood is made up of multiple cells which provide different functions.  Red blood cells carry oxygen and are used for blood loss replacement.  White blood cells fight against infection.  Platelets control bleeding.  Plasma helps clot blood.  Other blood products are available for specialized needs, such as hemophilia or other clotting disorders. BEFORE THE TRANSFUSION  Who gives blood for transfusions?   Healthy volunteers who are fully evaluated to make sure their blood is safe. This is blood bank blood. Transfusion therapy is the safest it has ever been in the practice of medicine. Before blood is taken from a donor, a complete history is taken to make sure that person has no history  of diseases nor engages in risky social behavior (examples are intravenous drug use or sexual activity with multiple partners). The donor's travel history is screened to minimize risk of transmitting infections, such as malaria. The donated blood is tested for signs of infectious diseases, such as HIV and hepatitis. The blood is then tested to be sure it is compatible with you in order to minimize the chance of a transfusion reaction. If you or a relative donates blood, this is often done in anticipation of surgery and is not appropriate for emergency situations. It takes many days to process the donated blood. RISKS AND COMPLICATIONS Although transfusion therapy is very safe and saves many lives, the main dangers of transfusion include:   Getting an infectious disease.  Developing a transfusion reaction. This is an allergic reaction to something in the blood you were given. Every precaution is taken to prevent this. The decision to have a blood transfusion has been considered carefully by your caregiver before blood is given. Blood is not given unless the benefits outweigh the risks. AFTER THE TRANSFUSION  Right after receiving a blood transfusion, you will usually feel much better and more energetic. This is especially true if your red blood cells have gotten low (anemic). The transfusion raises the level of the red blood cells which carry oxygen, and this usually causes an energy increase.  The nurse administering the transfusion will monitor you carefully for complications. HOME CARE INSTRUCTIONS  No special instructions are needed after a transfusion. You may find your energy is better. Speak with your caregiver about any limitations on activity for underlying diseases you may have. SEEK MEDICAL CARE IF:   Your condition is not improving after your transfusion.  You develop redness or  irritation at the intravenous (IV) site. SEEK IMMEDIATE MEDICAL CARE IF:  Any of the following symptoms  occur over the next 12 hours:  Shaking chills.  You have a temperature by mouth above 102 F (38.9 C), not controlled by medicine.  Chest, back, or muscle pain.  People around you feel you are not acting correctly or are confused.  Shortness of breath or difficulty breathing.  Dizziness and fainting.  You get a rash or develop hives.  You have a decrease in urine output.  Your urine turns a dark color or changes to pink, red, or brown. Any of the following symptoms occur over the next 10 days:  You have a temperature by mouth above 102 F (38.9 C), not controlled by medicine.  Shortness of breath.  Weakness after normal activity.  The white part of the eye turns yellow (jaundice).  You have a decrease in the amount of urine or are urinating less often.  Your urine turns a dark color or changes to pink, red, or brown. Document Released: 04/28/2000 Document Revised: 07/24/2011 Document Reviewed: 12/16/2007 [facility] Patient Information 2014 [place], [place].  _______________________________________________________________________

## 2014-03-16 ENCOUNTER — Encounter (HOSPITAL_COMMUNITY)
Admission: RE | Admit: 2014-03-16 | Discharge: 2014-03-16 | Disposition: A | Discharge status: Home / Self Care | Payer: BC Managed Care – PPO | Source: Ambulatory Visit | Attending: Orthopedic Surgery | Admitting: Orthopedic Surgery

## 2014-03-16 ENCOUNTER — Encounter (HOSPITAL_COMMUNITY): Payer: Self-pay

## 2014-03-16 ENCOUNTER — Encounter (INDEPENDENT_AMBULATORY_CARE_PROVIDER_SITE_OTHER): Payer: Self-pay

## 2014-03-16 DIAGNOSIS — Z01812 Encounter for preprocedural laboratory examination: Secondary | ICD-10-CM | POA: Diagnosis not present

## 2014-03-16 HISTORY — DX: Other intervertebral disc degeneration, lumbar region without mention of lumbar back pain or lower extremity pain: M51.369

## 2014-03-16 HISTORY — DX: Pure hypercholesterolemia, unspecified: E78.00

## 2014-03-16 HISTORY — DX: Gastro-esophageal reflux disease without esophagitis: K21.9

## 2014-03-16 HISTORY — DX: Other intervertebral disc degeneration, lumbar region: M51.36

## 2014-03-16 HISTORY — DX: Sleep apnea, unspecified: G47.30

## 2014-03-16 HISTORY — DX: Unspecified osteoarthritis, unspecified site: M19.90

## 2014-03-16 LAB — CBC
HEMATOCRIT: 40.8 % (ref 36.0–46.0)
Hemoglobin: 13.8 g/dL (ref 12.0–15.0)
MCH: 29.4 pg (ref 26.0–34.0)
MCHC: 33.8 g/dL (ref 30.0–36.0)
MCV: 86.8 fL (ref 78.0–100.0)
PLATELETS: 238 10*3/uL (ref 150–400)
RBC: 4.7 MIL/uL (ref 3.87–5.11)
RDW: 13.5 % (ref 11.5–15.5)
WBC: 7.4 10*3/uL (ref 4.0–10.5)

## 2014-03-16 LAB — URINALYSIS, ROUTINE W REFLEX MICROSCOPIC
BILIRUBIN URINE: NEGATIVE
Glucose, UA: NEGATIVE mg/dL
HGB URINE DIPSTICK: NEGATIVE
KETONES UR: NEGATIVE mg/dL
Nitrite: NEGATIVE
PROTEIN: NEGATIVE mg/dL
Specific Gravity, Urine: 1.008 (ref 1.005–1.030)
UROBILINOGEN UA: 0.2 mg/dL (ref 0.0–1.0)
pH: 6.5 (ref 5.0–8.0)

## 2014-03-16 LAB — URINE MICROSCOPIC-ADD ON

## 2014-03-16 LAB — COMPREHENSIVE METABOLIC PANEL
ALBUMIN: 3.8 g/dL (ref 3.5–5.2)
ALT: 26 U/L (ref 0–35)
ANION GAP: 11 (ref 5–15)
AST: 26 U/L (ref 0–37)
Alkaline Phosphatase: 95 U/L (ref 39–117)
BUN: 9 mg/dL (ref 6–23)
CHLORIDE: 104 meq/L (ref 96–112)
CO2: 30 mEq/L (ref 19–32)
Calcium: 9.6 mg/dL (ref 8.4–10.5)
Creatinine, Ser: 0.71 mg/dL (ref 0.50–1.10)
GFR calc Af Amer: >90 mL/min (ref >90)
GFR calc non Af Amer: 90 mL/min — ABNORMAL LOW (ref >90)
Glucose, Bld: 92 mg/dL (ref 70–99)
Potassium: 4.4 mEq/L (ref 3.7–5.3)
Sodium: 145 mEq/L (ref 137–147)
TOTAL PROTEIN: 7.4 g/dL (ref 6.0–8.3)
Total Bilirubin: 0.6 mg/dL (ref 0.3–1.2)

## 2014-03-16 LAB — PROTIME-INR
INR: 1.16 (ref 0.00–1.49)
PROTHROMBIN TIME: 15 s (ref 11.6–15.2)

## 2014-03-16 LAB — APTT: aPTT: 37 seconds (ref 24–37)

## 2014-03-16 LAB — SURGICAL PCR SCREEN
MRSA, PCR: NEGATIVE
Staphylococcus aureus: NEGATIVE

## 2014-03-16 LAB — ABO/RH: ABO/RH(D): O POS

## 2014-03-16 NOTE — Progress Notes (Signed)
CXR 03/10/2014 in epic ECHO 03/10/2014 in epic  EKG in epic 03/06/2014

## 2014-03-16 NOTE — Progress Notes (Signed)
Spoke with patient, pt aware to bring mouthguard am of surgery

## 2014-03-16 NOTE — Progress Notes (Signed)
Dr Stephanie Acre note per chart

## 2014-03-16 NOTE — Progress Notes (Signed)
Pt has sleep apnea uses mouth guard  Clearance note Dr Mare Ferrari in epic 03/06/2014

## 2014-03-17 ENCOUNTER — Ambulatory Visit: Payer: Self-pay | Admitting: Orthopedic Surgery

## 2014-03-17 NOTE — Progress Notes (Signed)
Please use the NEW UPDATED Preoperative surgical consent which has been place into the Epic hospital system for Brandi Rodgers on 03/17/2014, 5:23 PM  by Mickel Crow for surgery on 03-23-2014. The original consent was for the Left TKA and the new consent has added a cortisone injection into the right knee at the same time. [name], PA-C

## 2014-03-22 ENCOUNTER — Ambulatory Visit: Payer: Self-pay | Admitting: Orthopedic Surgery

## 2014-03-22 ENCOUNTER — Encounter (HOSPITAL_COMMUNITY): Payer: Self-pay | Admitting: Anesthesiology

## 2014-03-22 NOTE — Anesthesia Preprocedure Evaluation (Addendum)
Anesthesia Evaluation  Patient identified by MRN, date of birth, ID band Patient awake    Reviewed: Allergy & Precautions, H&P , NPO status , Patient's Chart, lab work & pertinent test results  History of Anesthesia Complications (+) Family history of anesthesia reaction  Airway Mallampati: II  TM Distance: >3 FB Neck ROM: Full    Dental no notable dental hx.    Pulmonary sleep apnea , pneumonia -,  breath sounds clear to auscultation  Pulmonary exam normal       Cardiovascular hypertension, Pt. on medications and Pt. on home beta blockers + dysrhythmias Atrial Fibrillation Rhythm:Regular Rate:Normal     Neuro/Psych  Headaches, negative psych ROS   GI/Hepatic Neg liver ROS, GERD-  Medicated,  Endo/Other  Morbid obesity  Renal/GU negative Renal ROS  negative genitourinary   Musculoskeletal  (+) Arthritis -,   Abdominal (+) + obese,   Peds negative pediatric ROS (+)  Hematology negative hematology ROS (+)   Anesthesia Other Findings   Reproductive/Obstetrics negative OB ROS                             Anesthesia Physical Anesthesia Plan  ASA: III  Anesthesia Plan: General   Post-op Pain Management:    Induction: Intravenous  Airway Management Planned: Oral ETT  Additional Equipment:   Intra-op Plan:   Post-operative Plan: Extubation in OR  Informed Consent: I have reviewed the patients History and Physical, chart, labs and discussed the procedure including the risks, benefits and alternatives for the proposed anesthesia with the patient or authorized representative who has indicated his/her understanding and acceptance.   Dental advisory given  Plan Discussed with: CRNA  Anesthesia Plan Comments: (Discussed spinal and general. Off Eliquis since 03-20-14 (about 60 hours). GFR borderline. Plan general. Patient consents.)       Anesthesia Quick Evaluation

## 2014-03-22 NOTE — H&P (Signed)
Brandi Rodgers. Margulies DOB: 1951-02-02 Married / Language: English / Race: White Female Date of Admission:  03-23-2014 Chief Complaint:  Left Knee Pain History of Present Illness  The patient is a 63 year old female who comes in today for a preoperative History and Physical. The patient is scheduled for a left total knee arthroplasty to be performed by Dr. Dione Plover. Aluisio, MD at [facility] on 03-23-2014. The patient is a 63 year old female who presents  for follow up of their knee. The patient is being followed for their left knee pain and osteoarthritis. They are now several months out from Euflexxa series. Symptoms reported today include: pain, swelling, aching, stiffness, locking, catching, grinding and instability. The patient feels that they are doing poorly and report their pain level to be moderate. The following medication has been used for pain control: antiinflammatory medication (Aleve, prn). The patient has not gotten any relief of their symptoms with viscosupplementation. She reports that the Euflexxa may have helped slightly, but was not near as effective as previous series. Unfortunately, the knee is getting progressively worse. She does not have a tremendous amount of benefit from the Euflexxa. She is having increased functional issues. She is ready to proceed with surgery at this time. They have been treated conservatively in the past for the above stated problem and despite conservative measures, they continue to have progressive pain and severe functional limitations and dysfunction. They have failed non-operative management including home exercise, medications, and injections. It is felt that they would benefit from undergoing total joint replacement. Risks and benefits of the procedure have been discussed with the patient and they elect to proceed with surgery. There are no active contraindications to surgery such as ongoing infection or rapidly progressive neurological  disease.  Problem List/Past Medical  Degenerative lumbar disc (M51.36) Primary osteoarthritis of left knee (M17.12) Lumbar facet arthropathy (M47.816) Chronic low back pain (M54.5) Osteoarthritis of left knee (M17.9) Sleep Apnea Mouth Guard Skin Cancer Chronic Cystitis Migraine Headache Gastroesophageal Reflux Disease Shingles Bronchitis Past History Pneumonia Past History Hypercholesterolemia Varicose veins Urinary Tract Infection Past History Measles Mumps Menopause Atrial Fibrillation Paroxsymal  Allergies  No Known Drug Allergies  Family History Osteoarthritis Maternal Grandmother, Mother. Hypertension Mother. Cancer Mother. Diabetes Mellitus Maternal Grandmother. Heart Disease Maternal Grandmother, Mother. Congestive Heart Failure Mother. Chronic Obstructive Lung Disease Father, Mother. Atrial Fibrillation Mother, Maternal Grandmother.  Social History Number of flights of stairs before winded 2-3 Not under pain contract Tobacco use Never smoker. 07/31/2013 Tobacco / smoke exposure 07/31/2013: no No history of drug/alcohol rehab Exercise Exercises rarely; does other Current work status retired Marital status married Living situation live with spouse Current drinker 07/31/2013: Currently drinks wine only occasionally per week Children 1  Medication History Biotin (10MG  Tablet, Oral) Active. Atenolol (50MG  Tablet, Oral) Active. Gabapentin (600MG  Tablet, Oral) Active. Rizatriptan Benzoate (10MG  Tablet Disperse, Oral) Active. Aleve (220MG  Capsule, 1 (one) Oral) Active. (prn) Viactiv (500-100-40 Tablet Chewable, Oral) Active. Multiple Vitamins/Womens (1 (one) Oral) Active.   Past Surgical History Carpal Tunnel Repair right Dilation and Curettage of Uterus - Multiple Arthroscopy of Knee bilateral Leg Circulation Surgery bilateral   Review of Systems  General Not Present- Chills, Fatigue, Fever, Memory  Loss, Night Sweats, Weight Gain and Weight Loss. Skin Not Present- Eczema, Hives, Itching, Lesions and Rash. HEENT Present- Headache (History of Migraines). Not Present- Dentures, Double Vision, Hearing Loss, Tinnitus and Visual Loss. Respiratory Not Present- Allergies, Chronic Cough, Coughing up blood, Shortness of breath at  rest and Shortness of breath with exertion. Cardiovascular Present- Palpitations (Undergoing workup at this time (October 2015)) and Swelling. Not Present- Chest Pain, Difficulty Breathing Lying Down, Murmur and Racing/skipping heartbeats. Gastrointestinal Present- Difficulty Swallowing (intermittent depending upon type of food) and Heartburn. Not Present- Abdominal Pain, Bloody Stool, Constipation, Diarrhea, Jaundice, Loss of appetitie, Nausea and Vomiting. Female Genitourinary Present- Urinary frequency and Urinating at Night. Not Present- Blood in Urine, Discharge, Flank Pain, Incontinence, Painful Urination, Urgency, Urinary Retention and Weak urinary stream. Musculoskeletal Present- Back Pain, Joint Pain, Joint Swelling, Morning Stiffness, Muscle Pain, Muscle Weakness and Spasms. Neurological Not Present- Blackout spells, Difficulty with balance, Dizziness, Paralysis, Tremor and Weakness. Psychiatric Not Present- Insomnia  Vitals Pulse: 54 (Regular)  Resp.: 12 (Unlabored)  BP: 148/88 (Sitting, Right Arm, Standard)   Physical Exam General Mental Status -Alert, cooperative and good historian. General Appearance-pleasant, Not in acute distress. Orientation-Oriented X3. Build & Nutrition-Well nourished and Well developed.  Head and Neck Head-normocephalic, atraumatic . Neck Global Assessment - supple, no bruit auscultated on the right, no bruit auscultated on the left.  Eye Pupil - Bilateral-Regular and Round. Motion - Bilateral-EOMI.  Chest and Lung Exam Auscultation Breath sounds - clear at anterior chest wall and clear at posterior chest  wall. Adventitious sounds - No Adventitious sounds.  Cardiovascular Auscultation Rhythm - Regular and Bradycardic. Heart Sounds - S1 WNL and S2 WNL. Murmurs & Other Heart Sounds - Auscultation of the heart reveals - No Murmurs.  Abdomen Inspection Contour - Generalized mild distention. Palpation/Percussion Tenderness - Abdomen is non-tender to palpation. Rigidity (guarding) - Abdomen is soft. Auscultation Auscultation of the abdomen reveals - Bowel sounds normal.  Female Genitourinary Note: Not done, not pertinent to present illness   Musculoskeletal Note: On exam, she is alert and oriented in no apparent distress. Her hips show normal motion with no discomfort. Her right knee exam is normal. Left knee no effusion. Range of motion is about 5 to 125. Moderate crepitus on range of motion. Tenderness medial greater than lateral with no instability.  RADIOGRAPHS: X-rays. She is bone on bone in the medial and patellofemoral compartments of that knee.   Assessment & Plan  Osteoarthritis of left knee (M17.9) Note:Plan is for a Left Total Knee Replacement by Dr. Wynelle Link.  Plan is to go to SNF versus Home.  PCP - Dr. Jonathon Jordan - At time of the H&P, the patient was referred over for cardiac evaluation. Cardiology - Dr. Mare Ferrari - Patient has been seen preoperatively and an ECHO was pending at the time of this H&P.  The patient does not have any contraindications and will receive TXA (tranexamic acid) prior to surgery.  Signed electronically by Joelene Millin, III PA-C

## 2014-03-23 ENCOUNTER — Inpatient Hospital Stay (HOSPITAL_COMMUNITY): Payer: BC Managed Care – PPO | Admitting: Anesthesiology

## 2014-03-23 ENCOUNTER — Encounter (HOSPITAL_COMMUNITY): Payer: Self-pay | Admitting: *Deleted

## 2014-03-23 ENCOUNTER — Inpatient Hospital Stay (HOSPITAL_COMMUNITY)
Admission: RE | Admit: 2014-03-23 | Discharge: 2014-03-25 | DRG: 470 | Disposition: A | Discharge status: Home Health | Payer: BC Managed Care – PPO | Source: Ambulatory Visit | Attending: Orthopedic Surgery | Admitting: Orthopedic Surgery

## 2014-03-23 ENCOUNTER — Encounter (HOSPITAL_COMMUNITY)
Admission: RE | Disposition: A | Discharge status: Home Health | Payer: Self-pay | Source: Ambulatory Visit | Attending: Orthopedic Surgery

## 2014-03-23 DIAGNOSIS — M170 Bilateral primary osteoarthritis of knee: Principal | ICD-10-CM | POA: Diagnosis present

## 2014-03-23 DIAGNOSIS — G4730 Sleep apnea, unspecified: Secondary | ICD-10-CM | POA: Diagnosis present

## 2014-03-23 DIAGNOSIS — Z01812 Encounter for preprocedural laboratory examination: Secondary | ICD-10-CM | POA: Diagnosis not present

## 2014-03-23 DIAGNOSIS — M1710 Unilateral primary osteoarthritis, unspecified knee: Secondary | ICD-10-CM | POA: Diagnosis present

## 2014-03-23 DIAGNOSIS — M25562 Pain in left knee: Secondary | ICD-10-CM | POA: Diagnosis present

## 2014-03-23 DIAGNOSIS — I10 Essential (primary) hypertension: Secondary | ICD-10-CM | POA: Diagnosis present

## 2014-03-23 DIAGNOSIS — K219 Gastro-esophageal reflux disease without esophagitis: Secondary | ICD-10-CM | POA: Diagnosis present

## 2014-03-23 DIAGNOSIS — M1712 Unilateral primary osteoarthritis, left knee: Secondary | ICD-10-CM

## 2014-03-23 DIAGNOSIS — Z6841 Body Mass Index (BMI) 40.0 and over, adult: Secondary | ICD-10-CM

## 2014-03-23 DIAGNOSIS — M179 Osteoarthritis of knee, unspecified: Secondary | ICD-10-CM | POA: Diagnosis present

## 2014-03-23 HISTORY — PX: TOTAL KNEE ARTHROPLASTY: SHX125

## 2014-03-23 LAB — TYPE AND SCREEN
ABO/RH(D): O POS
ABO/RH(D): O POS
ANTIBODY SCREEN: NEGATIVE
Antibody Screen: NEGATIVE

## 2014-03-23 SURGERY — ARTHROPLASTY, KNEE, TOTAL
Anesthesia: General | Site: Knee | Laterality: Left

## 2014-03-23 MED ORDER — SODIUM CHLORIDE 0.9 % IJ SOLN
INTRAMUSCULAR | Status: AC
  Filled 2014-03-23: qty 50

## 2014-03-23 MED ORDER — GLYCOPYRROLATE 0.2 MG/ML IJ SOLN
INTRAMUSCULAR | Status: DC | PRN
  Administered 2014-03-23: .4 mg via INTRAVENOUS

## 2014-03-23 MED ORDER — LACTATED RINGERS IV SOLN
INTRAVENOUS | Status: DC
  Administered 2014-03-23: 1000 mL via INTRAVENOUS

## 2014-03-23 MED ORDER — KETAMINE HCL 10 MG/ML IJ SOLN
INTRAMUSCULAR | Status: DC | PRN
  Administered 2014-03-23: 30 mg via INTRAVENOUS

## 2014-03-23 MED ORDER — LIDOCAINE HCL (CARDIAC) 20 MG/ML IV SOLN
INTRAVENOUS | Status: AC
  Filled 2014-03-23: qty 5

## 2014-03-23 MED ORDER — ACETAMINOPHEN 10 MG/ML IV SOLN
1000.0000 mg | Freq: Once | INTRAVENOUS | Status: AC
  Administered 2014-03-23: 1000 mg via INTRAVENOUS
  Filled 2014-03-23: qty 100

## 2014-03-23 MED ORDER — METHYLPREDNISOLONE ACETATE 40 MG/ML IJ SUSP
INTRAMUSCULAR | Status: AC
  Filled 2014-03-23: qty 2

## 2014-03-23 MED ORDER — HYDROMORPHONE HCL 2 MG/ML IJ SOLN
INTRAMUSCULAR | Status: AC
  Filled 2014-03-23: qty 1

## 2014-03-23 MED ORDER — HYDROMORPHONE HCL 1 MG/ML IJ SOLN
INTRAMUSCULAR | Status: AC
  Filled 2014-03-23: qty 1

## 2014-03-23 MED ORDER — ACETAMINOPHEN 650 MG RE SUPP: 650.0000 mg | Freq: Four times a day (QID) | RECTAL | Status: DC | PRN

## 2014-03-23 MED ORDER — FLEET ENEMA 7-19 GM/118ML RE ENEM: 1.0000 | ENEMA | Freq: Once | RECTAL | Status: AC | PRN

## 2014-03-23 MED ORDER — BUPIVACAINE LIPOSOME 1.3 % IJ SUSP
20.0000 mL | Freq: Once | INTRAMUSCULAR | Status: DC
  Filled 2014-03-23: qty 20

## 2014-03-23 MED ORDER — METHOCARBAMOL 500 MG PO TABS
500.0000 mg | ORAL_TABLET | Freq: Four times a day (QID) | ORAL | Status: DC | PRN
  Administered 2014-03-23 – 2014-03-25 (×3): 500 mg via ORAL
  Filled 2014-03-23 (×4): qty 1

## 2014-03-23 MED ORDER — CHLORHEXIDINE GLUCONATE 4 % EX LIQD: 60.0000 mL | Freq: Once | CUTANEOUS | Status: DC

## 2014-03-23 MED ORDER — METOCLOPRAMIDE HCL 5 MG/ML IJ SOLN: 5.0000 mg | Freq: Three times a day (TID) | INTRAMUSCULAR | Status: DC | PRN

## 2014-03-23 MED ORDER — KETOROLAC TROMETHAMINE 15 MG/ML IJ SOLN
INTRAMUSCULAR | Status: AC
  Filled 2014-03-23: qty 1

## 2014-03-23 MED ORDER — HYDROMORPHONE HCL 1 MG/ML IJ SOLN
0.2500 mg | INTRAMUSCULAR | Status: DC | PRN
  Administered 2014-03-23: 0.25 mg via INTRAVENOUS

## 2014-03-23 MED ORDER — BUPIVACAINE LIPOSOME 1.3 % IJ SUSP
INTRAMUSCULAR | Status: DC | PRN
  Administered 2014-03-23: 20 mL

## 2014-03-23 MED ORDER — PROMETHAZINE HCL 25 MG/ML IJ SOLN: 6.2500 mg | INTRAMUSCULAR | Status: DC | PRN

## 2014-03-23 MED ORDER — DEXAMETHASONE SODIUM PHOSPHATE 10 MG/ML IJ SOLN
INTRAMUSCULAR | Status: AC
  Filled 2014-03-23: qty 1

## 2014-03-23 MED ORDER — MENTHOL 3 MG MT LOZG
1.0000 | LOZENGE | OROMUCOSAL | Status: DC | PRN
  Filled 2014-03-23: qty 9

## 2014-03-23 MED ORDER — FUROSEMIDE 20 MG PO TABS
20.0000 mg | ORAL_TABLET | Freq: Every day | ORAL | Status: DC | PRN
  Filled 2014-03-23: qty 1

## 2014-03-23 MED ORDER — 0.9 % SODIUM CHLORIDE (POUR BTL) OPTIME
TOPICAL | Status: DC | PRN
  Administered 2014-03-23: 1000 mL

## 2014-03-23 MED ORDER — DEXAMETHASONE SODIUM PHOSPHATE 10 MG/ML IJ SOLN
10.0000 mg | Freq: Once | INTRAMUSCULAR | Status: AC
  Administered 2014-03-24: 10 mg via INTRAVENOUS
  Filled 2014-03-23: qty 1

## 2014-03-23 MED ORDER — ONDANSETRON HCL 4 MG/2ML IJ SOLN
INTRAMUSCULAR | Status: AC
  Filled 2014-03-23: qty 2

## 2014-03-23 MED ORDER — HYDROMORPHONE HCL 1 MG/ML IJ SOLN
INTRAMUSCULAR | Status: DC | PRN
  Administered 2014-03-23: 1 mg via INTRAVENOUS

## 2014-03-23 MED ORDER — BISACODYL 10 MG RE SUPP: 10.0000 mg | Freq: Every day | RECTAL | Status: DC | PRN

## 2014-03-23 MED ORDER — PROPOFOL 10 MG/ML IV BOLUS
INTRAVENOUS | Status: AC
  Filled 2014-03-23: qty 20

## 2014-03-23 MED ORDER — FENTANYL CITRATE 0.05 MG/ML IJ SOLN
INTRAMUSCULAR | Status: AC
  Filled 2014-03-23: qty 5

## 2014-03-23 MED ORDER — CEFAZOLIN SODIUM-DEXTROSE 2-3 GM-% IV SOLR
2.0000 g | Freq: Four times a day (QID) | INTRAVENOUS | Status: AC
  Administered 2014-03-23 (×2): 2 g via INTRAVENOUS
  Filled 2014-03-23 (×2): qty 50

## 2014-03-23 MED ORDER — MIDAZOLAM HCL 5 MG/5ML IJ SOLN
INTRAMUSCULAR | Status: DC | PRN
  Administered 2014-03-23: 2 mg via INTRAVENOUS

## 2014-03-23 MED ORDER — PROPOFOL 10 MG/ML IV BOLUS
INTRAVENOUS | Status: DC | PRN
  Administered 2014-03-23: 180 mg via INTRAVENOUS

## 2014-03-23 MED ORDER — SUMATRIPTAN SUCCINATE 100 MG PO TABS
100.0000 mg | ORAL_TABLET | ORAL | Status: DC | PRN
  Filled 2014-03-23: qty 1

## 2014-03-23 MED ORDER — TRANEXAMIC ACID 100 MG/ML IV SOLN
1000.0000 mg | INTRAVENOUS | Status: AC
  Administered 2014-03-23: 1000 mg via INTRAVENOUS
  Filled 2014-03-23: qty 10

## 2014-03-23 MED ORDER — NEOSTIGMINE METHYLSULFATE 10 MG/10ML IV SOLN
INTRAVENOUS | Status: DC | PRN
  Administered 2014-03-23: 3 mg via INTRAVENOUS

## 2014-03-23 MED ORDER — PHENOL 1.4 % MT LIQD
1.0000 | OROMUCOSAL | Status: DC | PRN
  Filled 2014-03-23: qty 177

## 2014-03-23 MED ORDER — KETAMINE HCL 10 MG/ML IJ SOLN
INTRAMUSCULAR | Status: AC
  Filled 2014-03-23: qty 1

## 2014-03-23 MED ORDER — BUPIVACAINE HCL 0.25 % IJ SOLN: INTRAMUSCULAR | Status: DC | PRN

## 2014-03-23 MED ORDER — MORPHINE SULFATE 2 MG/ML IJ SOLN
1.0000 mg | INTRAMUSCULAR | Status: DC | PRN
  Administered 2014-03-23: 1 mg via INTRAVENOUS
  Filled 2014-03-23: qty 1

## 2014-03-23 MED ORDER — SODIUM CHLORIDE 0.9 % IJ SOLN
INTRAMUSCULAR | Status: DC | PRN
  Administered 2014-03-23: 30 mL via INTRAVENOUS

## 2014-03-23 MED ORDER — ACETAMINOPHEN 325 MG PO TABS: 650.0000 mg | ORAL_TABLET | Freq: Four times a day (QID) | ORAL | Status: DC | PRN

## 2014-03-23 MED ORDER — NEOSTIGMINE METHYLSULFATE 10 MG/10ML IV SOLN
INTRAVENOUS | Status: AC
  Filled 2014-03-23: qty 1

## 2014-03-23 MED ORDER — DEXTROSE-NACL 5-0.45 % IV SOLN
INTRAVENOUS | Status: DC
  Administered 2014-03-23 – 2014-03-24 (×2): via INTRAVENOUS

## 2014-03-23 MED ORDER — DEXAMETHASONE SODIUM PHOSPHATE 10 MG/ML IJ SOLN
10.0000 mg | Freq: Once | INTRAMUSCULAR | Status: AC
  Administered 2014-03-23: 10 mg via INTRAVENOUS

## 2014-03-23 MED ORDER — GABAPENTIN 300 MG PO CAPS
600.0000 mg | ORAL_CAPSULE | Freq: Three times a day (TID) | ORAL | Status: DC
  Administered 2014-03-23 – 2014-03-25 (×4): 600 mg via ORAL
  Filled 2014-03-23 (×8): qty 2

## 2014-03-23 MED ORDER — DEXTROSE 5 % IV SOLN
500.0000 mg | Freq: Four times a day (QID) | INTRAVENOUS | Status: DC | PRN
  Administered 2014-03-23: 500 mg via INTRAVENOUS
  Filled 2014-03-23 (×2): qty 5

## 2014-03-23 MED ORDER — ATENOLOL 50 MG PO TABS
50.0000 mg | ORAL_TABLET | Freq: Every morning | ORAL | Status: DC
  Administered 2014-03-24 – 2014-03-25 (×2): 50 mg via ORAL
  Filled 2014-03-23 (×2): qty 1

## 2014-03-23 MED ORDER — FENTANYL CITRATE 0.05 MG/ML IJ SOLN
INTRAMUSCULAR | Status: DC | PRN
  Administered 2014-03-23 (×5): 50 ug via INTRAVENOUS

## 2014-03-23 MED ORDER — RIVAROXABAN 10 MG PO TABS
10.0000 mg | ORAL_TABLET | Freq: Every day | ORAL | Status: DC
  Administered 2014-03-24 – 2014-03-25 (×2): 10 mg via ORAL
  Filled 2014-03-23 (×3): qty 1

## 2014-03-23 MED ORDER — METOCLOPRAMIDE HCL 10 MG PO TABS: 5.0000 mg | ORAL_TABLET | Freq: Three times a day (TID) | ORAL | Status: DC | PRN

## 2014-03-23 MED ORDER — ACETAMINOPHEN 500 MG PO TABS
1000.0000 mg | ORAL_TABLET | Freq: Four times a day (QID) | ORAL | Status: AC
Start: 2014-03-23 — End: 2014-03-24
  Administered 2014-03-23 – 2014-03-24 (×4): 1000 mg via ORAL
  Filled 2014-03-23 (×4): qty 2

## 2014-03-23 MED ORDER — LACTATED RINGERS IV SOLN
INTRAVENOUS | Status: DC | PRN
  Administered 2014-03-23 (×2): via INTRAVENOUS

## 2014-03-23 MED ORDER — ONDANSETRON HCL 4 MG PO TABS: 4.0000 mg | ORAL_TABLET | Freq: Four times a day (QID) | ORAL | Status: DC | PRN

## 2014-03-23 MED ORDER — BUPIVACAINE HCL (PF) 0.25 % IJ SOLN
INTRAMUSCULAR | Status: AC
  Filled 2014-03-23: qty 30

## 2014-03-23 MED ORDER — MIDAZOLAM HCL 2 MG/2ML IJ SOLN
INTRAMUSCULAR | Status: AC
  Filled 2014-03-23: qty 2

## 2014-03-23 MED ORDER — METHYLPREDNISOLONE ACETATE 80 MG/ML IJ SUSP
INTRAMUSCULAR | Status: DC | PRN
  Administered 2014-03-23: 2 mL via INTRA_ARTICULAR

## 2014-03-23 MED ORDER — ROCURONIUM BROMIDE 100 MG/10ML IV SOLN
INTRAVENOUS | Status: DC | PRN
  Administered 2014-03-23: 50 mg via INTRAVENOUS

## 2014-03-23 MED ORDER — HYDROMORPHONE HCL 1 MG/ML IJ SOLN: 0.2500 mg | INTRAMUSCULAR | Status: DC | PRN

## 2014-03-23 MED ORDER — TRAMADOL HCL 50 MG PO TABS
50.0000 mg | ORAL_TABLET | Freq: Four times a day (QID) | ORAL | Status: DC | PRN
  Administered 2014-03-24: 100 mg via ORAL
  Filled 2014-03-23: qty 2

## 2014-03-23 MED ORDER — OXYCODONE HCL 5 MG PO TABS
5.0000 mg | ORAL_TABLET | ORAL | Status: DC | PRN
  Administered 2014-03-23 (×3): 10 mg via ORAL
  Administered 2014-03-24: 5 mg via ORAL
  Administered 2014-03-24: 10 mg via ORAL
  Administered 2014-03-24: 5 mg via ORAL
  Administered 2014-03-24 – 2014-03-25 (×3): 10 mg via ORAL
  Filled 2014-03-23 (×4): qty 2
  Filled 2014-03-23: qty 1
  Filled 2014-03-23 (×3): qty 2
  Filled 2014-03-23: qty 1
  Filled 2014-03-23: qty 2

## 2014-03-23 MED ORDER — GLYCOPYRROLATE 0.2 MG/ML IJ SOLN
INTRAMUSCULAR | Status: AC
  Filled 2014-03-23: qty 2

## 2014-03-23 MED ORDER — ONDANSETRON HCL 4 MG/2ML IJ SOLN: 4.0000 mg | Freq: Four times a day (QID) | INTRAMUSCULAR | Status: DC | PRN

## 2014-03-23 MED ORDER — DEXTROSE 5 % IV SOLN
3.0000 g | INTRAVENOUS | Status: AC
  Administered 2014-03-23: 3 g via INTRAVENOUS
  Filled 2014-03-23: qty 3000

## 2014-03-23 MED ORDER — KETOROLAC TROMETHAMINE 15 MG/ML IJ SOLN
7.5000 mg | Freq: Four times a day (QID) | INTRAMUSCULAR | Status: AC | PRN
  Administered 2014-03-23: 7.5 mg via INTRAVENOUS

## 2014-03-23 MED ORDER — SODIUM CHLORIDE 0.9 % IR SOLN
Status: DC | PRN
  Administered 2014-03-23: 1000 mL

## 2014-03-23 MED ORDER — GABAPENTIN 600 MG PO TABS
600.0000 mg | ORAL_TABLET | Freq: Three times a day (TID) | ORAL | Status: DC
  Filled 2014-03-23 (×2): qty 1

## 2014-03-23 MED ORDER — DOCUSATE SODIUM 100 MG PO CAPS
100.0000 mg | ORAL_CAPSULE | Freq: Two times a day (BID) | ORAL | Status: DC
  Administered 2014-03-23 – 2014-03-25 (×4): 100 mg via ORAL

## 2014-03-23 MED ORDER — RIZATRIPTAN BENZOATE 10 MG PO TBDP: 10.0000 mg | ORAL_TABLET | ORAL | Status: DC | PRN

## 2014-03-23 MED ORDER — DIPHENHYDRAMINE HCL 12.5 MG/5ML PO ELIX: 12.5000 mg | ORAL_SOLUTION | ORAL | Status: DC | PRN

## 2014-03-23 MED ORDER — LIDOCAINE HCL (CARDIAC) 20 MG/ML IV SOLN
INTRAVENOUS | Status: DC | PRN
  Administered 2014-03-23: 100 mg via INTRAVENOUS

## 2014-03-23 MED ORDER — BUPIVACAINE HCL 0.25 % IJ SOLN
INTRAMUSCULAR | Status: DC | PRN
  Administered 2014-03-23: 30 mL

## 2014-03-23 MED ORDER — EPHEDRINE SULFATE 50 MG/ML IJ SOLN
INTRAMUSCULAR | Status: DC | PRN
  Administered 2014-03-23: 5 mg via INTRAVENOUS

## 2014-03-23 MED ORDER — ONDANSETRON HCL 4 MG/2ML IJ SOLN
INTRAMUSCULAR | Status: DC | PRN
  Administered 2014-03-23: 4 mg via INTRAVENOUS

## 2014-03-23 MED ORDER — POLYETHYLENE GLYCOL 3350 17 G PO PACK: 17.0000 g | PACK | Freq: Every day | ORAL | Status: DC | PRN

## 2014-03-23 MED ORDER — SODIUM CHLORIDE 0.9 % IV SOLN: INTRAVENOUS | Status: DC

## 2014-03-23 SURGICAL SUPPLY — 58 items
BAG ZIPLOCK 12X15 (MISCELLANEOUS) ×2 IMPLANT
BANDAGE ELASTIC 6 VELCRO ST LF (GAUZE/BANDAGES/DRESSINGS) ×2 IMPLANT
BANDAGE ESMARK 6X9 LF (GAUZE/BANDAGES/DRESSINGS) ×1 IMPLANT
BLADE SAG 18X100X1.27 (BLADE) ×2 IMPLANT
BLADE SAW SGTL 11.0X1.19X90.0M (BLADE) ×2 IMPLANT
BNDG ESMARK 6X9 LF (GAUZE/BANDAGES/DRESSINGS) ×2
BOWL SMART MIX CTS (DISPOSABLE) ×2 IMPLANT
CAP KNEE ATTUNE RP ×2 IMPLANT
CEMENT HV SMART SET (Cement) ×4 IMPLANT
CUFF TOURN SGL QUICK 34 (TOURNIQUET CUFF) ×1
CUFF TRNQT CYL 34X4X40X1 (TOURNIQUET CUFF) ×1 IMPLANT
DECANTER SPIKE VIAL GLASS SM (MISCELLANEOUS) ×2 IMPLANT
DRAPE EXTREMITY TIBURON (DRAPES) ×2 IMPLANT
DRAPE POUCH INSTRU U-SHP 10X18 (DRAPES) ×2 IMPLANT
DRAPE U-SHAPE 47X51 STRL (DRAPES) ×2 IMPLANT
DRSG ADAPTIC 3X8 NADH LF (GAUZE/BANDAGES/DRESSINGS) ×2 IMPLANT
DRSG PAD ABDOMINAL 8X10 ST (GAUZE/BANDAGES/DRESSINGS) ×2 IMPLANT
DURAPREP 26ML APPLICATOR (WOUND CARE) ×2 IMPLANT
ELECT REM PT RETURN 9FT ADLT (ELECTROSURGICAL) ×2
ELECTRODE REM PT RTRN 9FT ADLT (ELECTROSURGICAL) ×1 IMPLANT
EVACUATOR 1/8 PVC DRAIN (DRAIN) ×2 IMPLANT
FACESHIELD WRAPAROUND (MASK) ×10 IMPLANT
GAUZE SPONGE 4X4 12PLY STRL (GAUZE/BANDAGES/DRESSINGS) ×2 IMPLANT
GLOVE BIO SURGEON STRL SZ7.5 (GLOVE) IMPLANT
GLOVE BIO SURGEON STRL SZ8 (GLOVE) ×2 IMPLANT
GLOVE BIOGEL PI IND STRL 6.5 (GLOVE) IMPLANT
GLOVE BIOGEL PI IND STRL 8 (GLOVE) ×1 IMPLANT
GLOVE BIOGEL PI INDICATOR 6.5 (GLOVE)
GLOVE BIOGEL PI INDICATOR 8 (GLOVE) ×1
GLOVE SURG SS PI 6.5 STRL IVOR (GLOVE) IMPLANT
GOWN STRL REUS W/TWL LRG LVL3 (GOWN DISPOSABLE) ×2 IMPLANT
GOWN STRL REUS W/TWL XL LVL3 (GOWN DISPOSABLE) IMPLANT
HANDPIECE INTERPULSE COAX TIP (DISPOSABLE) ×1
IMMOBILIZER KNEE 20 (SOFTGOODS) ×4 IMPLANT
IMMOBILIZER KNEE 20 THIGH 36 (SOFTGOODS) ×1 IMPLANT
KIT BASIN OR (CUSTOM PROCEDURE TRAY) ×2 IMPLANT
MANIFOLD NEPTUNE II (INSTRUMENTS) ×2 IMPLANT
NDL SAFETY ECLIPSE 18X1.5 (NEEDLE) ×3 IMPLANT
NEEDLE HYPO 18GX1.5 SHARP (NEEDLE) ×3
NS IRRIG 1000ML POUR BTL (IV SOLUTION) ×2 IMPLANT
PACK TOTAL JOINT (CUSTOM PROCEDURE TRAY) ×2 IMPLANT
PAD ABD 8X10 STRL (GAUZE/BANDAGES/DRESSINGS) ×2 IMPLANT
PADDING CAST COTTON 6X4 STRL (CAST SUPPLIES) ×2 IMPLANT
POSITIONER SURGICAL ARM (MISCELLANEOUS) ×2 IMPLANT
SET HNDPC FAN SPRY TIP SCT (DISPOSABLE) ×1 IMPLANT
STRIP CLOSURE SKIN 1/2X4 (GAUZE/BANDAGES/DRESSINGS) ×2 IMPLANT
SUCTION FRAZIER 12FR DISP (SUCTIONS) ×2 IMPLANT
SUT MNCRL AB 4-0 PS2 18 (SUTURE) ×2 IMPLANT
SUT VIC AB 2-0 CT1 27 (SUTURE) ×3
SUT VIC AB 2-0 CT1 TAPERPNT 27 (SUTURE) ×3 IMPLANT
SUT VLOC 180 0 24IN GS25 (SUTURE) ×2 IMPLANT
SYR 20CC LL (SYRINGE) ×2 IMPLANT
SYR 50ML LL SCALE MARK (SYRINGE) ×2 IMPLANT
TOWEL OR 17X26 10 PK STRL BLUE (TOWEL DISPOSABLE) ×2 IMPLANT
TOWEL OR NON WOVEN STRL DISP B (DISPOSABLE) IMPLANT
TRAY FOLEY CATH 14FRSI W/METER (CATHETERS) ×2 IMPLANT
WATER STERILE IRR 1500ML POUR (IV SOLUTION) ×2 IMPLANT
WRAP KNEE MAXI GEL POST OP (GAUZE/BANDAGES/DRESSINGS) ×2 IMPLANT

## 2014-03-23 NOTE — Anesthesia Postprocedure Evaluation (Signed)
  Anesthesia Post-op Note  Patient: Brandi Rodgers  Procedure(s) Performed: Procedure(s) (LRB): LEFT TOTAL KNEE ARTHROPLASTY  (Left)  Patient Location: PACU  Anesthesia Type: General  Level of Consciousness: awake and alert   Airway and Oxygen Therapy: Patient Spontanous Breathing  Post-op Pain: mild  Post-op Assessment: Post-op Vital signs reviewed, Patient's Cardiovascular Status Stable, Respiratory Function Stable, Patent Airway and No signs of Nausea or vomiting  Last Vitals:  Filed Vitals:   03/23/14 1216  BP: 143/81  Pulse: 61  Temp: 36.6 C  Resp: 14    Post-op Vital Signs: stable   Complications: No apparent anesthesia complications

## 2014-03-23 NOTE — Progress Notes (Signed)
Utilization review completed.  

## 2014-03-23 NOTE — Interval H&P Note (Signed)
History and Physical Interval Note:  03/23/2014 7:14 AM  Brandi Rodgers  has presented today for surgery, with the diagnosis of LEFT KNEE OA   The various methods of treatment have been discussed with the patient and family. After consideration of risks, benefits and other options for treatment, the patient has consented to  Procedure(s): LEFT TOTAL KNEE ARTHROPLASTY (Left) as a surgical intervention .  The patient's history has been reviewed, patient examined, no change in status, stable for surgery.  I have reviewed the patient's chart and labs.  Questions were answered to the patient's satisfaction.     [name]

## 2014-03-23 NOTE — H&P (View-Only) (Signed)
Brandi Rodgers. Barno DOB: 10-Dec-1950 Married / Language: English / Race: White Female Date of Admission:  03-23-2014 Chief Complaint:  Left Knee Pain History of Present Illness  The patient is a 63 year old female who comes in today for a preoperative History and Physical. The patient is scheduled for a left total knee arthroplasty to be performed by Dr. Dione Plover. Aluisio, MD at [facility] on 03-23-2014. The patient is a 63 year old female who presents  for follow up of their knee. The patient is being followed for their left knee pain and osteoarthritis. They are now several months out from Euflexxa series. Symptoms reported today include: pain, swelling, aching, stiffness, locking, catching, grinding and instability. The patient feels that they are doing poorly and report their pain level to be moderate. The following medication has been used for pain control: antiinflammatory medication (Aleve, prn). The patient has not gotten any relief of their symptoms with viscosupplementation. She reports that the Euflexxa may have helped slightly, but was not near as effective as previous series. Unfortunately, the knee is getting progressively worse. She does not have a tremendous amount of benefit from the Euflexxa. She is having increased functional issues. She is ready to proceed with surgery at this time. They have been treated conservatively in the past for the above stated problem and despite conservative measures, they continue to have progressive pain and severe functional limitations and dysfunction. They have failed non-operative management including home exercise, medications, and injections. It is felt that they would benefit from undergoing total joint replacement. Risks and benefits of the procedure have been discussed with the patient and they elect to proceed with surgery. There are no active contraindications to surgery such as ongoing infection or rapidly progressive neurological  disease.  Problem List/Past Medical  Degenerative lumbar disc (M51.36) Primary osteoarthritis of left knee (M17.12) Lumbar facet arthropathy (M47.816) Chronic low back pain (M54.5) Osteoarthritis of left knee (M17.9) Sleep Apnea Mouth Guard Skin Cancer Chronic Cystitis Migraine Headache Gastroesophageal Reflux Disease Shingles Bronchitis Past History Pneumonia Past History Hypercholesterolemia Varicose veins Urinary Tract Infection Past History Measles Mumps Menopause Atrial Fibrillation Paroxsymal  Allergies  No Known Drug Allergies  Family History Osteoarthritis Maternal Grandmother, Mother. Hypertension Mother. Cancer Mother. Diabetes Mellitus Maternal Grandmother. Heart Disease Maternal Grandmother, Mother. Congestive Heart Failure Mother. Chronic Obstructive Lung Disease Father, Mother. Atrial Fibrillation Mother, Maternal Grandmother.  Social History Number of flights of stairs before winded 2-3 Not under pain contract Tobacco use Never smoker. 07/31/2013 Tobacco / smoke exposure 07/31/2013: no No history of drug/alcohol rehab Exercise Exercises rarely; does other Current work status retired Marital status married Living situation live with spouse Current drinker 07/31/2013: Currently drinks wine only occasionally per week Children 1  Medication History Biotin (10MG  Tablet, Oral) Active. Atenolol (50MG  Tablet, Oral) Active. Gabapentin (600MG  Tablet, Oral) Active. Rizatriptan Benzoate (10MG  Tablet Disperse, Oral) Active. Aleve (220MG  Capsule, 1 (one) Oral) Active. (prn) Viactiv (500-100-40 Tablet Chewable, Oral) Active. Multiple Vitamins/Womens (1 (one) Oral) Active.   Past Surgical History Carpal Tunnel Repair right Dilation and Curettage of Uterus - Multiple Arthroscopy of Knee bilateral Leg Circulation Surgery bilateral   Review of Systems  General Not Present- Chills, Fatigue, Fever, Memory  Loss, Night Sweats, Weight Gain and Weight Loss. Skin Not Present- Eczema, Hives, Itching, Lesions and Rash. HEENT Present- Headache (History of Migraines). Not Present- Dentures, Double Vision, Hearing Loss, Tinnitus and Visual Loss. Respiratory Not Present- Allergies, Chronic Cough, Coughing up blood, Shortness of breath at  rest and Shortness of breath with exertion. Cardiovascular Present- Palpitations (Undergoing workup at this time (October 2015)) and Swelling. Not Present- Chest Pain, Difficulty Breathing Lying Down, Murmur and Racing/skipping heartbeats. Gastrointestinal Present- Difficulty Swallowing (intermittent depending upon type of food) and Heartburn. Not Present- Abdominal Pain, Bloody Stool, Constipation, Diarrhea, Jaundice, Loss of appetitie, Nausea and Vomiting. Female Genitourinary Present- Urinary frequency and Urinating at Night. Not Present- Blood in Urine, Discharge, Flank Pain, Incontinence, Painful Urination, Urgency, Urinary Retention and Weak urinary stream. Musculoskeletal Present- Back Pain, Joint Pain, Joint Swelling, Morning Stiffness, Muscle Pain, Muscle Weakness and Spasms. Neurological Not Present- Blackout spells, Difficulty with balance, Dizziness, Paralysis, Tremor and Weakness. Psychiatric Not Present- Insomnia  Vitals Pulse: 54 (Regular)  Resp.: 12 (Unlabored)  BP: 148/88 (Sitting, Right Arm, Standard)   Physical Exam General Mental Status -Alert, cooperative and good historian. General Appearance-pleasant, Not in acute distress. Orientation-Oriented X3. Build & Nutrition-Well nourished and Well developed.  Head and Neck Head-normocephalic, atraumatic . Neck Global Assessment - supple, no bruit auscultated on the right, no bruit auscultated on the left.  Eye Pupil - Bilateral-Regular and Round. Motion - Bilateral-EOMI.  Chest and Lung Exam Auscultation Breath sounds - clear at anterior chest wall and clear at posterior chest  wall. Adventitious sounds - No Adventitious sounds.  Cardiovascular Auscultation Rhythm - Regular and Bradycardic. Heart Sounds - S1 WNL and S2 WNL. Murmurs & Other Heart Sounds - Auscultation of the heart reveals - No Murmurs.  Abdomen Inspection Contour - Generalized mild distention. Palpation/Percussion Tenderness - Abdomen is non-tender to palpation. Rigidity (guarding) - Abdomen is soft. Auscultation Auscultation of the abdomen reveals - Bowel sounds normal.  Female Genitourinary Note: Not done, not pertinent to present illness   Musculoskeletal Note: On exam, she is alert and oriented in no apparent distress. Her hips show normal motion with no discomfort. Her right knee exam is normal. Left knee no effusion. Range of motion is about 5 to 125. Moderate crepitus on range of motion. Tenderness medial greater than lateral with no instability.  RADIOGRAPHS: X-rays. She is bone on bone in the medial and patellofemoral compartments of that knee.   Assessment & Plan  Osteoarthritis of left knee (M17.9) Note:Plan is for a Left Total Knee Replacement by Dr. Wynelle Link.  Plan is to go to SNF versus Home.  PCP - Dr. Jonathon Jordan - At time of the H&P, the patient was referred over for cardiac evaluation. Cardiology - Dr. Mare Ferrari - Patient has been seen preoperatively and an ECHO was pending at the time of this H&P.  The patient does not have any contraindications and will receive TXA (tranexamic acid) prior to surgery.  Signed electronically by Joelene Millin, III PA-C

## 2014-03-23 NOTE — Evaluation (Deleted)
Physical Therapy Evaluation Patient Details Nam Charges:     PT G Codes:       Brandi Rodgers 04-05-14, 4:00 PM

## 2014-03-23 NOTE — Transfer of Care (Signed)
Immediate Anesthesia Transfer of Care Note  Patient: Brandi Rodgers  Procedure(s) Performed: Procedure(s): LEFT TOTAL KNEE ARTHROPLASTY  (Left)  Patient Location: PACU  Anesthesia Type:General  Level of Consciousness: awake, alert  and oriented  Airway & Oxygen Therapy: Patient Spontanous Breathing and Patient connected to face mask oxygen  Post-op Assessment: Report given to PACU RN  Post vital signs: Reviewed and stable  Complications: No apparent anesthesia complications

## 2014-03-23 NOTE — Op Note (Signed)
Pre-operative diagnosis- Osteoarthritis  Bilateral knee(s)  Post-operative diagnosis- Osteoarthritis Bilateral knee(s)  Procedure-  Left  Total Knee Arthroplasty (Attune system)   Right knee cortisone injection  Surgeon- Dione Plover. Makhya Arave, MD  Assistant- [name], PA-C   Anesthesia-  General  EBL-* No blood loss amount entered *   Drains Hemovac  Tourniquet time-  Total Tourniquet Time Documented: Thigh (Left) - 40 minutes Total: Thigh (Left) - 40 minutes     Complications- None  Condition-PACU - hemodynamically stable.   Brief Clinical Note  Brandi Rodgers is a 63 y.o. year old female with end stage OA of her left knee with progressively worsening pain and dysfunction. She has constant pain, with activity and at rest and significant functional deficits with difficulties even with ADLs. She has had extensive non-op management including analgesics, injections of cortisone and viscosupplements, and home exercise program, but remains in significant pain with significant dysfunction. Radiographs show bone on bone arthritis all 3 compartments. She presents now for left Total Knee Arthroplasty.    Procedure in detail---   The patient is brought into the operating room and positioned supine on the operating table. After successful administration of  General,   a tourniquet is placed high on the  Left thigh(s) and the lower extremity is prepped and draped in the usual sterile fashion. Time out is performed by the operating team and then the  Left lower extremity is wrapped in Esmarch, knee flexed and the tourniquet inflated to 300 mmHg.       A midline incision is made with a ten blade through the subcutaneous tissue to the level of the extensor mechanism. A fresh blade is used to make a medial parapatellar arthrotomy. Soft tissue over the proximal medial tibia is subperiosteally elevated to the joint line with a knife and into the semimembranosus bursa with a Cobb elevator. Soft  tissue over the proximal lateral tibia is elevated with attention being paid to avoiding the patellar tendon on the tibial tubercle. The patella is everted, knee flexed 90 degrees and the ACL and PCL are removed. Findings are bone on bone all 3 compartments with massive global osteophytes.        The drill is used to create a starting hole in the distal femur and the canal is thoroughly irrigated with sterile saline to remove the fatty contents. The 5 degree Left  valgus alignment guide is placed into the femoral canal and the distal femoral cutting block is pinned to remove 10 mm off the distal femur. Resection is made with an oscillating saw.      The tibia is subluxed forward and the menisci are removed. The extramedullary alignment guide is placed referencing proximally at the medial aspect of the tibial tubercle and distally along the second metatarsal axis and tibial crest. The block is pinned to remove 86mm off the more deficient medial  side. Resection is made with an oscillating saw. Size 6is the most appropriate size for the tibia and the proximal tibia is prepared with the modular drill and keel punch for that size.      The femoral sizing guide is placed and size 6 is most appropriate. Rotation is marked off the epicondylar axis and confirmed by creating a rectangular flexion gap at 90 degrees. The size 6 cutting block is pinned in this rotation and the anterior, posterior and chamfer cuts are made with the oscillating saw. The intercondylar block is then placed and that cut is made.  Trial size 6 tibial component, trial size 6 posterior stabilized femur and a 10  mm posterior stabilized rotating platform insert trial is placed. Full extension is achieved with excellent varus/valgus and anterior/posterior balance throughout full range of motion. The patella is everted and thickness measured to be 24  mm. Free hand resection is taken to 14 mm, a 38 template is placed, lug holes are drilled, trial  patella is placed, and it tracks normally. Osteophytes are removed off the posterior femur with the trial in place. All trials are removed and the cut bone surfaces prepared with pulsatile lavage. Cement is mixed and once ready for implantation, the size 6 tibial implant, size  6 posterior stabilized femoral component, and the size 38 patella are cemented in place and the patella is held with the clamp. The trial insert is placed and the knee held in full extension. The Exparel (20 ml mixed with 30 ml saline) and .25% Bupivicaine, are injected into the extensor mechanism, posterior capsule, medial and lateral gutters and subcutaneous tissues.  All extruded cement is removed and once the cement is hard the permanent 10 mm posterior stabilized rotating platform insert is placed into the tibial tray.      The wound is copiously irrigated with saline solution and the extensor mechanism closed over a hemovac drain with #1 V-loc suture. The tourniquet is released for a total tourniquet time of 40  minutes. Flexion against gravity is 130 degrees and the patella tracks normally. Subcutaneous tissue is closed with 2.0 vicryl and subcuticular with running 4.0 Monocryl. The incision is cleaned and dried and steri-strips and a bulky sterile dressing are applied. The limb is placed into a knee immobilizer.      I then prepped the right knee with Betadine and injected with 4 ml .25% Marcaine and 80 mg Depomedrol without problems. The patient is then awakened and transported to recovery in stable condition.      Please note that a surgical assistant was a medical necessity for this procedure in order to perform it in a safe and expeditious manner. Surgical assistant was necessary to retract the ligaments and vital neurovascular structures to prevent injury to them and also necessary for proper positioning of the limb to allow for anatomic placement of the prosthesis.   Dione Plover Terita Hejl, MD    03/23/2014, 9:25 AM

## 2014-03-24 LAB — CBC
HCT: 34.7 % — ABNORMAL LOW (ref 36.0–46.0)
Hemoglobin: 11.6 g/dL — ABNORMAL LOW (ref 12.0–15.0)
MCH: 29.1 pg (ref 26.0–34.0)
MCHC: 33.4 g/dL (ref 30.0–36.0)
MCV: 87.2 fL (ref 78.0–100.0)
Platelets: 206 10*3/uL (ref 150–400)
RBC: 3.98 MIL/uL (ref 3.87–5.11)
RDW: 13.4 % (ref 11.5–15.5)
WBC: 16.4 10*3/uL — ABNORMAL HIGH (ref 4.0–10.5)

## 2014-03-24 LAB — BASIC METABOLIC PANEL
Anion gap: 9 (ref 5–15)
BUN: 10 mg/dL (ref 6–23)
CO2: 27 meq/L (ref 19–32)
CREATININE: 0.59 mg/dL (ref 0.50–1.10)
Calcium: 9.2 mg/dL (ref 8.4–10.5)
Chloride: 99 mEq/L (ref 96–112)
GFR calc Af Amer: >90 mL/min (ref >90)
Glucose, Bld: 162 mg/dL — ABNORMAL HIGH (ref 70–99)
Potassium: 4.2 mEq/L (ref 3.7–5.3)
Sodium: 135 mEq/L — ABNORMAL LOW (ref 137–147)

## 2014-03-24 MED ORDER — TRAMADOL HCL 50 MG PO TABS: 50.0000 mg | ORAL_TABLET | Freq: Four times a day (QID) | ORAL | Status: DC | PRN

## 2014-03-24 MED ORDER — OXYCODONE HCL 5 MG PO TABS: 5.0000 mg | ORAL_TABLET | ORAL | Status: DC | PRN

## 2014-03-24 MED ORDER — METHOCARBAMOL 500 MG PO TABS: 500.0000 mg | ORAL_TABLET | Freq: Four times a day (QID) | ORAL | Status: DC | PRN

## 2014-03-24 NOTE — Discharge Summary (Signed)
Physician Discharge Summary   Patient ID: Brandi Rodgers MRN: [id number] DOB/AGE: February 18, 1951 63 y.o.  Admit date: 03/23/2014 Discharge date: 03/25/2014  Primary Diagnosis:  Osteoarthritis Bilateral knee(s) Admission Diagnoses:  Past Medical History  Diagnosis Date  . Paroxysmal a-fib   . Family history of adverse reaction to anesthesia     son has had nausea and vomiting   . Dysrhythmia     paroxsymal A Fib   . Sleep apnea     uses mouth guard  . Pneumonia     hx of   . Bronchitis     hx of   . GERD (gastroesophageal reflux disease)   . Headache     hx of migraines  . Cancer     hx of skin cancers upper arms bilat   . Chronic cystitis   . Shingles     hx of   . Hypercholesterolemia   . Varicose veins     hx of   . Urinary tract infection     hx of   . Measles     hx of   . Mumps     hx of   . Degenerative lumbar disc     hx of   . Osteoarthritis     left knee  . Chronic low back pain   . Hemorrhoids     hx of    Discharge Diagnoses:   Principal Problem:   OA (osteoarthritis) of knee  Estimated body mass index is 43.5 kg/(m^2) as calculated from the following:   Height as of this encounter: _0  (1.727 m).   Weight as of this encounter: 129.729 kg (286 lb).  Procedure:  Procedure(s) (LRB): LEFT TOTAL KNEE ARTHROPLASTY  (Left)   Consults: None  HPI: Brandi Rodgers is a 63 y.o. year old female with end stage OA of her left knee with progressively worsening pain and dysfunction. She has constant pain, with activity and at rest and significant functional deficits with difficulties even with ADLs. She has had extensive non-op management including analgesics, injections of cortisone and viscosupplements, and home exercise program, but remains in significant pain with significant dysfunction. Radiographs show bone on bone arthritis all 3 compartments. She presents now for left Total Knee Arthroplasty.  Laboratory Data: Admission on 03/23/2014,  Discharged on 03/25/2014  Component Date Value Ref Range Status  . ABO/RH(D) 03/23/2014 O POS   Final  . Antibody Screen 03/23/2014 NEG   Final  . Sample Expiration 03/23/2014 03/26/2014   Final  . WBC 03/24/2014 16.4* 4.0 - 10.5 K/uL Final  . RBC 03/24/2014 3.98  3.87 - 5.11 MIL/uL Final  . Hemoglobin 03/24/2014 11.6* 12.0 - 15.0 g/dL Final  . HCT 03/24/2014 34.7* 36.0 - 46.0 % Final  . MCV 03/24/2014 87.2  78.0 - 100.0 fL Final  . MCH 03/24/2014 29.1  26.0 - 34.0 pg Final  . MCHC 03/24/2014 33.4  30.0 - 36.0 g/dL Final  . RDW 03/24/2014 13.4  11.5 - 15.5 % Final  . Platelets 03/24/2014 206  150 - 400 K/uL Final  . Sodium 03/24/2014 135* 137 - 147 mEq/L Final  . Potassium 03/24/2014 4.2  3.7 - 5.3 mEq/L Final  . Chloride 03/24/2014 99  96 - 112 mEq/L Final  . CO2 03/24/2014 27  19 - 32 mEq/L Final  . Glucose, Bld 03/24/2014 162* 70 - 99 mg/dL Final  . BUN 03/24/2014 10  6 - 23 mg/dL Final  . Creatinine, Ser 03/24/2014 0.59  0.50 - 1.10 mg/dL Final  . Calcium 03/24/2014 9.2  8.4 - 10.5 mg/dL Final  . GFR calc non Af Amer 03/24/2014 >90  >90 mL/min Final  . GFR calc Af Amer 03/24/2014 >90  >90 mL/min Final   Comment: (NOTE) The eGFR has been calculated using the CKD EPI equation. This calculation has not been validated in all clinical situations. eGFR's persistently <90 mL/min signify possible Chronic Kidney Disease.   . Anion gap 03/24/2014 9  5 - 15 Final  . WBC 03/25/2014 18.5* 4.0 - 10.5 K/uL Final  . RBC 03/25/2014 3.53* 3.87 - 5.11 MIL/uL Final  . Hemoglobin 03/25/2014 10.3* 12.0 - 15.0 g/dL Final  . HCT 03/25/2014 31.1* 36.0 - 46.0 % Final  . MCV 03/25/2014 88.1  78.0 - 100.0 fL Final  . MCH 03/25/2014 29.2  26.0 - 34.0 pg Final  . MCHC 03/25/2014 33.1  30.0 - 36.0 g/dL Final  . RDW 03/25/2014 13.6  11.5 - 15.5 % Final  . Platelets 03/25/2014 217  150 - 400 K/uL Final  . Sodium 03/25/2014 137  137 - 147 mEq/L Final  . Potassium 03/25/2014 4.3  3.7 - 5.3 mEq/L Final  .  Chloride 03/25/2014 100  96 - 112 mEq/L Final  . CO2 03/25/2014 29  19 - 32 mEq/L Final  . Glucose, Bld 03/25/2014 127* 70 - 99 mg/dL Final  . BUN 03/25/2014 15  6 - 23 mg/dL Final  . Creatinine, Ser 03/25/2014 0.63  0.50 - 1.10 mg/dL Final  . Calcium 03/25/2014 9.1  8.4 - 10.5 mg/dL Final  . GFR calc non Af Amer 03/25/2014 >90  >90 mL/min Final  . GFR calc Af Amer 03/25/2014 >90  >90 mL/min Final   Comment: (NOTE) The eGFR has been calculated using the CKD EPI equation. This calculation has not been validated in all clinical situations. eGFR's persistently <90 mL/min signify possible Chronic Kidney Disease.   Georgiann Hahn gap 03/25/2014 8  5 - 15 Final  Hospital Outpatient Visit on 03/16/2014  Component Date Value Ref Range Status  . aPTT 03/16/2014 37  24 - 37 seconds Final   Comment:        IF BASELINE aPTT IS ELEVATED, SUGGEST PATIENT RISK ASSESSMENT BE USED TO DETERMINE APPROPRIATE ANTICOAGULANT THERAPY.   . WBC 03/16/2014 7.4  4.0 - 10.5 K/uL Final  . RBC 03/16/2014 4.70  3.87 - 5.11 MIL/uL Final  . Hemoglobin 03/16/2014 13.8  12.0 - 15.0 g/dL Final  . HCT 03/16/2014 40.8  36.0 - 46.0 % Final  . MCV 03/16/2014 86.8  78.0 - 100.0 fL Final  . MCH 03/16/2014 29.4  26.0 - 34.0 pg Final  . MCHC 03/16/2014 33.8  30.0 - 36.0 g/dL Final  . RDW 03/16/2014 13.5  11.5 - 15.5 % Final  . Platelets 03/16/2014 238  150 - 400 K/uL Final  . Sodium 03/16/2014 145  137 - 147 mEq/L Final  . Potassium 03/16/2014 4.4  3.7 - 5.3 mEq/L Final  . Chloride 03/16/2014 104  96 - 112 mEq/L Final  . CO2 03/16/2014 30  19 - 32 mEq/L Final  . Glucose, Bld 03/16/2014 92  70 - 99 mg/dL Final  . BUN 03/16/2014 9  6 - 23 mg/dL Final  . Creatinine, Ser 03/16/2014 0.71  0.50 - 1.10 mg/dL Final  . Calcium 03/16/2014 9.6  8.4 - 10.5 mg/dL Final  . Total Protein 03/16/2014 7.4  6.0 - 8.3 g/dL Final  . Albumin 03/16/2014 3.8  3.5 -  5.2 g/dL Final  . AST 03/16/2014 26  0 - 37 U/L Final  . ALT 03/16/2014 26  0 - 35  U/L Final  . Alkaline Phosphatase 03/16/2014 95  39 - 117 U/L Final  . Total Bilirubin 03/16/2014 0.6  0.3 - 1.2 mg/dL Final  . GFR calc non Af Amer 03/16/2014 90* >90 mL/min Final  . GFR calc Af Amer 03/16/2014 >90  >90 mL/min Final   Comment: (NOTE) The eGFR has been calculated using the CKD EPI equation. This calculation has not been validated in all clinical situations. eGFR's persistently <90 mL/min signify possible Chronic Kidney Disease.   . Anion gap 03/16/2014 11  5 - 15 Final  . Prothrombin Time 03/16/2014 15.0  11.6 - 15.2 seconds Final  . INR 03/16/2014 1.16  0.00 - 1.49 Final  . ABO/RH(D) 03/16/2014 O POS   Final  . Antibody Screen 03/16/2014 NEG   Final  . Sample Expiration 03/16/2014 03/22/2014   Final  . Color, Urine 03/16/2014 YELLOW  YELLOW Final  . APPearance 03/16/2014 CLEAR  CLEAR Final  . Specific Gravity, Urine 03/16/2014 1.008  1.005 - 1.030 Final  . pH 03/16/2014 6.5  5.0 - 8.0 Final  . Glucose, UA 03/16/2014 NEGATIVE  NEGATIVE mg/dL Final  . Hgb urine dipstick 03/16/2014 NEGATIVE  NEGATIVE Final  . Bilirubin Urine 03/16/2014 NEGATIVE  NEGATIVE Final  . Ketones, ur 03/16/2014 NEGATIVE  NEGATIVE mg/dL Final  . Protein, ur 03/16/2014 NEGATIVE  NEGATIVE mg/dL Final  . Urobilinogen, UA 03/16/2014 0.2  0.0 - 1.0 mg/dL Final  . Nitrite 03/16/2014 NEGATIVE  NEGATIVE Final  . Leukocytes, UA 03/16/2014 TRACE* NEGATIVE Final  . MRSA, PCR 03/16/2014 NEGATIVE  NEGATIVE Final  . Staphylococcus aureus 03/16/2014 NEGATIVE  NEGATIVE Final   Comment:        The Xpert SA Assay (FDA approved for NASAL specimens in patients over 13 years of age), is one component of a comprehensive surveillance program.  Test performance has been validated by [employer] for patients greater than or equal to 75 year old. It is not intended to diagnose infection nor to guide or monitor treatment.   . Squamous Epithelial / LPF 03/16/2014 RARE  RARE Final  . WBC, UA 03/16/2014 0-2  <3  WBC/hpf Final  . RBC / HPF 03/16/2014 0-2  <3 RBC/hpf Final  . ABO/RH(D) 03/16/2014 O POS   Final  Admission on 01/29/2014, Discharged on 01/29/2014  Component Date Value Ref Range Status  . WBC 01/29/2014 8.4  4.0 - 10.5 K/uL Final  . RBC 01/29/2014 4.57  3.87 - 5.11 MIL/uL Final  . Hemoglobin 01/29/2014 13.4  12.0 - 15.0 g/dL Final  . HCT 01/29/2014 39.5  36.0 - 46.0 % Final  . MCV 01/29/2014 86.4  78.0 - 100.0 fL Final  . MCH 01/29/2014 29.3  26.0 - 34.0 pg Final  . MCHC 01/29/2014 33.9  30.0 - 36.0 g/dL Final  . RDW 01/29/2014 14.0  11.5 - 15.5 % Final  . Platelets 01/29/2014 208  150 - 400 K/uL Final  . Sodium 01/29/2014 147  137 - 147 mEq/L Final  . Potassium 01/29/2014 4.4  3.7 - 5.3 mEq/L Final  . Chloride 01/29/2014 107  96 - 112 mEq/L Final  . CO2 01/29/2014 30  19 - 32 mEq/L Final  . Glucose, Bld 01/29/2014 113* 70 - 99 mg/dL Final  . BUN 01/29/2014 12  6 - 23 mg/dL Final  . Creatinine, Ser 01/29/2014 0.70  0.50 - 1.10  mg/dL Final  . Calcium 01/29/2014 10.0  8.4 - 10.5 mg/dL Final  . GFR calc non Af Amer 01/29/2014 >90  >90 mL/min Final  . GFR calc Af Amer 01/29/2014 >90  >90 mL/min Final   Comment: (NOTE)                          The eGFR has been calculated using the CKD EPI equation.                          This calculation has not been validated in all clinical situations.                          eGFR's persistently <90 mL/min signify possible Chronic Kidney                          Disease.  . Anion gap 01/29/2014 10  5 - 15 Final  . Troponin I 01/29/2014 <0.30  <0.30 ng/mL Final   Comment:                                 Due to the release kinetics of cTnI,                          a negative result within the first hours                          of the onset of symptoms does not rule out                          myocardial infarction with certainty.                          If myocardial infarction is still suspected,                          repeat the test  at appropriate intervals.     X-Rays:Dg Chest 2 View  03/10/2014   CLINICAL DATA:  Recent diagnosis of atrial fibrillation. Right hilar fullness on the recent prior chest radiograph.  EXAM: CHEST  2 VIEW  COMPARISON:  01/29/2014.  FINDINGS: Cardiac silhouette is normal in size. Aorta is mildly uncoiled. No mediastinal or hilar masses. No evidence of adenopathy. Hilar fullness noted on the prior study was likely due to technique and patient rotation to the right accentuating normal hilar vasculature.  Clear lungs.  No pleural effusion or pneumothorax.  Bony thorax is intact.  IMPRESSION: No active cardiopulmonary disease.   Electronically Signed   By: Lajean Manes M.D.   On: 03/10/2014 10:12    EKG: Orders placed or performed in visit on 03/17/14  . EKG     Hospital Course: Brandi Rodgers is a 63 y.o. who was admitted to [facility]. They were brought to the operating room on 03/23/2014 and underwent Procedure(s): LEFT TOTAL KNEE ARTHROPLASTY .  Patient tolerated the procedure well and was later transferred to the recovery room and then to the orthopaedic floor for postoperative care.  They were given PO and IV analgesics for pain control following their surgery.  They were given 24 hours of postoperative antibiotics of  Anti-infectives    Start     Dose/Rate Route Frequency Ordered Stop   03/23/14 1430  ceFAZolin (ANCEF) IVPB 2 g/50 mL premix     2 g100 mL/hr over 30 Minutes Intravenous Every 6 hours 03/23/14 1128 03/23/14 2026   03/23/14 0640  ceFAZolin (ANCEF) 3 g in dextrose 5 % 50 mL IVPB     3 g160 mL/hr over 30 Minutes Intravenous On call to O.R. 03/23/14 4431 03/23/14 0818     and started on DVT prophylaxis in the form of Xarelto.   PT and OT were ordered for total joint protocol.  Discharge planning consulted to help with postop disposition and equipment needs.  Patient had a decent night on the evening of surgery.  They were able to get some rest.  They started to get up  OOB with therapy on day one. Hemovac drain was pulled without difficulty.  Continued to work with therapy into day two.  Dressing was changed on day two and the incision was healing well.   Patient was seen in rounds and was ready to go home.  Discharge home with home health Diet - Cardiac diet Follow up - in 2 weeks Activity - WBAT Disposition - Home Condition Upon Discharge - Good D/C Meds - See DC Summary DVT Prophylaxis - Xarelto last dose her today, Will resume Eliquis at home.       Discharge Instructions    Call MD / Call 911    Complete by:  As directed   If you experience chest pain or shortness of breath, CALL 911 and be transported to the hospital emergency room.  If you develope a fever above 101 F, pus (white drainage) or increased drainage or redness at the wound, or calf pain, call your surgeon's office.     Change dressing    Complete by:  As directed   Change dressing daily with sterile 4 x 4 inch gauze dressing and apply TED hose. Do not submerge the incision under water.     Constipation Prevention    Complete by:  As directed   Drink plenty of fluids.  Prune juice may be helpful.  You may use a stool softener, such as Colace (over the counter) 100 mg twice a day.  Use MiraLax (over the counter) for constipation as needed.     Diet - low sodium heart healthy    Complete by:  As directed      Discharge instructions    Complete by:  As directed   Pick up stool softner and laxative for home. Do not submerge incision under water. May shower. Continue to use ice for pain and swelling from surgery.  Resume Eliquis upon returning home starting 03/26/2014.     Do not put a pillow under the knee. Place it under the heel.    Complete by:  As directed      Do not sit on low chairs, stoools or toilet seats, as it may be difficult to get up from low surfaces    Complete by:  As directed      Driving restrictions    Complete by:  As directed   No driving until released by  the physician.     Increase activity slowly as tolerated    Complete by:  As directed      Lifting restrictions    Complete by:  As directed   No lifting until released  by the physician.     Patient may shower    Complete by:  As directed   You may shower without a dressing once there is no drainage.  Do not wash over the wound.  If drainage remains, do not shower until drainage stops.     TED hose    Complete by:  As directed   Use stockings (TED hose) for 3 weeks on both leg(s).  You may remove them at night for sleeping.     Weight bearing as tolerated    Complete by:  As directed   Laterality:  left  Extremity:  Lower            Medication List    STOP taking these medications        acetaminophen-codeine 300-30 MG per tablet  Commonly known as:  TYLENOL #3     BIOTIN 5000 PO     naproxen sodium 220 MG tablet  Commonly known as:  ANAPROX      TAKE these medications        apixaban 5 MG Tabs tablet  Commonly known as:  ELIQUIS  Take 1 tablet (5 mg total) by mouth 2 (two) times daily.     atenolol 50 MG tablet  Commonly known as:  TENORMIN  Take 1 tablet by mouth every morning.     furosemide 20 MG tablet  Commonly known as:  LASIX  Take 20 mg by mouth daily as needed for fluid.     gabapentin 600 MG tablet  Commonly known as:  NEURONTIN  Take 600 mg by mouth 3 (three) times daily.     methocarbamol 500 MG tablet  Commonly known as:  ROBAXIN  Take 1 tablet (500 mg total) by mouth every 6 (six) hours as needed for muscle spasms.     multivitamin tablet  Take 1 tablet by mouth daily.     oxyCODONE 5 MG immediate release tablet  Commonly known as:  Oxy IR/ROXICODONE  Take 1-2 tablets (5-10 mg total) by mouth every 3 (three) hours as needed for moderate pain, severe pain or breakthrough pain.     REFRESH OP  Apply 1 drop to eye 2 (two) times daily as needed (for dry eyes).     rizatriptan 10 MG disintegrating tablet  Commonly known as:  MAXALT-MLT    Take 10 mg by mouth as needed for migraine. May repeat in 2 hours if needed     traMADol 50 MG tablet  Commonly known as:  ULTRAM  Take 1-2 tablets (50-100 mg total) by mouth every 6 (six) hours as needed (mild pain).     VIACTIV PO  Take 1 tablet by mouth daily.       Follow-up Information    Follow up with [facility].   Why:  home health physical therapy   Contact information:   [address] [address] [phone]       Follow up with [name], MD. Schedule an appointment as soon as possible for a visit on 04/07/2014.   Specialty:  Orthopedic Surgery   Why:  Call office at [phone] for follow up appointment on Tuesday 04/07/2014   Contact information:   [address] [place] [address] [phone]       Signed: [name], PA-C Orthopaedic Surgery 04/02/2014, 9:31 AM

## 2014-03-24 NOTE — Evaluation (Signed)
Physical Therapy Evaluation Patient Details Name: Brandi Rodgers MRN: [id number] DOB: 04/12/1951 Today's Date: 03/24/2014   History of Present Illness  L TKA; cortisone injection R knee  Clinical Impression  Pt will benefit from PT to address deficits below; plan is for home with HHPT, has RW    Follow Up Recommendations Home health PT    Equipment Recommendations  None recommended by PT    Recommendations for Other Services       Precautions / Restrictions Precautions Precautions: Knee Required Braces or Orthoses: Knee Immobilizer - Left Knee Immobilizer - Right: Discontinue once straight leg raise with < 10 degree lag Restrictions Weight Bearing Restrictions: No Other Position/Activity Restrictions: WBAT      Mobility  Bed Mobility Overal bed mobility: Needs Assistance Bed Mobility: Supine to Sit     Supine to sit: Min assist     General bed mobility comments: assist with RLE, cues for technique, self assist  Transfers Overall transfer level: Needs assistance Equipment used: Rolling walker (2 wheeled) Transfers: Sit to/from Stand Sit to Stand: Min assist         General transfer comment: verbal cues for hand placement,  control of descent, and LLE position  Ambulation/Gait Ambulation/Gait assistance: Min guard Ambulation Distance (Feet): 65 Feet Assistive device: Rolling walker (2 wheeled) Gait Pattern/deviations: Step-to pattern;Antalgic;Decreased weight shift to left     General Gait Details: cues for sequence, RW distance from self  Stairs            Wheelchair Mobility    Modified Rankin (Stroke Patients Only)       Balance Overall balance assessment: No apparent balance deficits (not formally assessed)                                           Pertinent Vitals/Pain Pain Assessment: 0-10 Pain Score: 2  Pain Location: L knee     Home Living Family/patient expects to be discharged to:: Private  residence Living Arrangements: Spouse/significant other Available Help at Discharge: Family Type of Home: House Home Access: Stairs to enter Entrance Stairs-Rails: Right Entrance Stairs-Number of Steps: 3 Home Layout: One level Home Equipment: Environmental consultant - 2 wheels;Walker - standard;Other (comment);Toilet riser;Walker - 4 wheels      Prior Function Level of Independence: Independent               Hand Dominance        Extremity/Trunk Assessment   Upper Extremity Assessment: Defer to OT evaluation             RLE Deficits / Details: able to do I SLR, 3/5 hip flexion and knee extension; ankle WFL       Communication   Communication: No difficulties  Cognition Arousal/Alertness: Awake/alert Behavior During Therapy: WFL for tasks assessed/performed Overall Cognitive Status: Within Functional Limits for tasks assessed                      General Comments      Exercises Total Joint Exercises Ankle Circles/Pumps: AROM;Both;10 reps Quad Sets: AROM;Both;10 reps      Assessment/Plan    PT Assessment Patient needs continued PT services  PT Diagnosis Difficulty walking   PT Problem List Decreased strength;Decreased range of motion;Decreased activity tolerance;Decreased balance;Decreased mobility;Decreased knowledge of use of DME  PT Treatment Interventions DME instruction;Gait training;Stair training;Functional mobility training;Therapeutic activities;Therapeutic exercise;Patient/family education  PT Goals (Current goals can be found in the Care Plan section) Acute Rehab PT Goals Patient Stated Goal: return to I PT Goal Formulation: With patient Time For Goal Achievement: 03/30/14 Potential to Achieve Goals: Good    Frequency 7X/week   Barriers to discharge        Co-evaluation               End of Session Equipment Utilized During Treatment: Gait belt Activity Tolerance: Patient tolerated treatment well Patient left: in chair;with call  bell/phone within reach Nurse Communication: Mobility status         Time: 7505-1833 PT Time Calculation (min) (ACUTE ONLY): 28 min   Charges:   PT Evaluation $Initial PT Evaluation Tier I: 1 Procedure PT Treatments $Gait Training: 23-37 mins   PT G Codes:          Brandi Rodgers 03/28/14, 10:21 AM

## 2014-03-24 NOTE — Discharge Instructions (Addendum)
° °Dr. Frank Aluisio °Total Joint Specialist °[facility] °[address] °[place], [place] [address] °[phone] ° °TOTAL KNEE REPLACEMENT POSTOPERATIVE DIRECTIONS ° ° ° °Knee Rehabilitation, Guidelines Following Surgery  °Results after knee surgery are often greatly improved when you follow the exercise, range of motion and muscle strengthening exercises prescribed by your doctor. Safety measures are also important to protect the knee from further injury. Any time any of these exercises cause you to have increased pain or swelling in your knee joint, decrease the amount until you are comfortable again and slowly increase them. If you have problems or questions, call your caregiver or physical therapist for advice.  ° °HOME CARE INSTRUCTIONS  °Remove items at home which could result in a fall. This includes throw rugs or furniture in walking pathways.  °Continue medications as instructed at time of discharge. °You may have some home medications which will be placed on hold until you complete the course of blood thinner medication.  °You may start showering once you are discharged home but do not submerge the incision under water. Just pat the incision dry and apply a dry gauze dressing on daily. °Walk with walker as instructed.  °You may resume a sexual relationship in one month or when given the OK by  your doctor.  °· Use walker as long as suggested by your caregivers. °· Avoid periods of inactivity such as sitting longer than an hour when not asleep. This helps prevent blood clots.  °You may put full weight on your legs and walk as much as is comfortable.  °You may return to work once you are cleared by your doctor.  °Do not drive a car for 6 weeks or until released by you surgeon.  °· Do not drive while taking narcotics.  °Wear the elastic stockings for three weeks following surgery during the day but you may remove then at night. °Make sure you keep all of your appointments after your  operation with all of your doctors and caregivers. You should call the office at the above phone number and make an appointment for approximately two weeks after the date of your surgery. °Change the dressing daily and reapply a dry dressing each time. °Please pick up a stool softener and laxative for home use as long as you are requiring pain medications. °· Continue to use ice on the knee for pain and swelling from surgery. You may notice swelling that will progress down to the foot and ankle.  This is normal after surgery.  Elevate the leg when you are not up walking on it.   °It is important for you to complete the blood thinner medication as prescribed by your doctor. °· Continue to use the breathing machine which will help keep your temperature down.  It is common for your temperature to cycle up and down following surgery, especially at night when you are not up moving around and exerting yourself.  The breathing machine keeps your lungs expanded and your temperature down. ° °RANGE OF MOTION AND STRENGTHENING EXERCISES  °Rehabilitation of the knee is important following a knee injury or an operation. After just a few days of immobilization, the muscles of the thigh which control the knee become weakened and shrink (atrophy). Knee exercises are designed to build up the tone and strength of the thigh muscles and to improve knee motion. Often times heat used for twenty to thirty minutes before working out will loosen up your tissues and help with improving the   range of motion but do not use heat for the first two weeks following surgery. These exercises can be done on a training (exercise) mat, on the floor, on a table or on a bed. Use what ever works the best and is most comfortable for you Knee exercises include:  Leg Lifts - While your knee is still immobilized in a splint or cast, you can do straight leg raises. Lift the leg to 60 degrees, hold for 3 sec, and slowly lower the leg. Repeat 10-20 times 2-3  times daily. Perform this exercise against resistance later as your knee gets better.  Quad and Hamstring Sets - Tighten up the muscle on the front of the thigh (Quad) and hold for 5-10 sec. Repeat this 10-20 times hourly. Hamstring sets are done by pushing the foot backward against an object and holding for 5-10 sec. Repeat as with quad sets.  A rehabilitation program following serious knee injuries can speed recovery and prevent re-injury in the future due to weakened muscles. Contact your doctor or a physical therapist for more information on knee rehabilitation.   SKILLED REHAB INSTRUCTIONS: If the patient is transferred to a skilled rehab facility following release from the hospital, a list of the current medications will be sent to the facility for the patient to continue.  When discharged from the skilled rehab facility, please have the facility set up the patient's [place] prior to being released. Also, the skilled facility will be responsible for providing the patient with their medications at time of release from the facility to include their pain medication, the muscle relaxants, and their blood thinner medication. If the patient is still at the rehab facility at time of the two week follow up appointment, the skilled rehab facility will also need to assist the patient in arranging follow up appointment in our office and any transportation needs.  MAKE SURE YOU:  Understand these instructions.  Will watch your condition.  Will get help right away if you are not doing well or get worse.    Pick up stool softner and laxative for home. Do not submerge incision under water. May shower. Continue to use ice for pain and swelling from surgery.  Postoperative Constipation Protocol  Constipation - defined medically as fewer than three stools per week and severe constipation as less than one stool per week.  One of the most common issues patients have following surgery is  constipation.  Even if you have a regular bowel pattern at home, your normal regimen is likely to be disrupted due to multiple reasons following surgery.  Combination of anesthesia, postoperative narcotics, change in appetite and fluid intake all can affect your bowels.  In order to avoid complications following surgery, here are some recommendations in order to help you during your recovery period.  Colace (docusate) - Pick up an over-the-counter form of Colace or another stool softener and take twice a day as long as you are requiring postoperative pain medications.  Take with a full glass of water daily.  If you experience loose stools or diarrhea, hold the colace until you stool forms back up.  If your symptoms do not get better within 1 week or if they get worse, check with your doctor.  Dulcolax (bisacodyl) - Pick up over-the-counter and take as directed by the product packaging as needed to assist with the movement of your bowels.  Take with a full glass of water.  Use this product as needed if not relieved  by Colace only.   MiraLax (polyethylene glycol) - Pick up over-the-counter to have on hand.  MiraLax is a solution that will increase the amount of water in your bowels to assist with bowel movements.  Take as directed and can mix with a glass of water, juice, soda, coffee, or tea.  Take if you go more than two days without a movement. Do not use MiraLax more than once per day. Call your doctor if you are still constipated or irregular after using this medication for 7 days in a row.  If you continue to have problems with postoperative constipation, please contact the office for further assistance and recommendations.  If you experience "the worst abdominal pain ever" or develop nausea or vomiting, please contact the office immediatly for further recommendations for treatment.  Information on my medicine - XARELTO (Rivaroxaban)  This medication education was reviewed with me or my healthcare  representative as part of my discharge preparation.  The pharmacist that spoke with me during my hospital stay was:  Minda Ditto, [facility]  Why was Xarelto prescribed for you? Xarelto was prescribed for you to reduce the risk of blood clots forming after orthopedic surgery. The medical term for these abnormal blood clots is venous thromboembolism (VTE).  What do you need to know about xarelto ? Take your Xarelto ONCE DAILY at the same time every day. You may take it either with or without food.  If you have difficulty swallowing the tablet whole, you may crush it and mix in applesauce just prior to taking your dose.  Take Xarelto exactly as prescribed by your doctor and DO NOT stop taking Xarelto without talking to the doctor who prescribed the medication.  Stopping without other VTE prevention medication to take the place of Xarelto may increase your risk of developing a clot.  After discharge, you should have regular check-up appointments with your healthcare provider that is prescribing your Xarelto.    What do you do if you miss a dose? If you miss a dose, take it as soon as you remember on the same day then continue your regularly scheduled once daily regimen the next day. Do not take two doses of Xarelto on the same day.   Important Safety Information A possible side effect of Xarelto is bleeding. You should call your healthcare provider right away if you experience any of the following: ? Bleeding from an injury or your nose that does not stop. ? Unusual colored urine (red or dark brown) or unusual colored stools (red or black). ? Unusual bruising for unknown reasons. ? A serious fall or if you hit your head (even if there is no bleeding).  Some medicines may interact with Xarelto and might increase your risk of bleeding while on Xarelto. To help avoid this, consult your healthcare provider or pharmacist prior to using any new prescription or non-prescription medications,  including herbals, vitamins, non-steroidal anti-inflammatory drugs (NSAIDs) and supplements.  This website has more information on Xarelto: [email or web address]  Discussed MD plan to resume Eliquis after discharge per ortho progress notes.   Patient is to resume Eliquis upon returning home on 03/26/2014.  She will receive her last dose of Xarelto on 03/25/2014 at the hospital prior to discharge and resume the Eliquis the following day at home.

## 2014-03-24 NOTE — Progress Notes (Addendum)
   Subjective: 1 Day Post-Op Procedure(s) (LRB): LEFT TOTAL KNEE ARTHROPLASTY  (Left) Patient reports pain as mild.   Patient seen in rounds with Dr. Wynelle Link.  Able to get some rest last night. Patient is well, but has had some minor complaints of pain in the knee, requiring pain medications We will start therapy today.  Plan is to go Home after hospital stay.  Objective: Vital signs in last 24 hours: Temp:  [97.5 F (36.4 C)-99.1 F (37.3 C)] 98.8 F (37.1 C) (11/10 0541) Pulse Rate:  [52-76] 53 (11/10 0541) Resp:  [14-18] 18 (11/10 0734) BP: (112-161)/(54-96) 112/55 mmHg (11/10 0541) SpO2:  [94 %-100 %] 94 % (11/10 0734)  Intake/Output from previous day:  Intake/Output Summary (Last 24 hours) at 03/24/14 0749 Last data filed at 03/24/14 0554  Gross per 24 hour  Intake 3272.5 ml  Output   4215 ml  Net -942.5 ml    Labs:  Recent Labs  03/24/14 0425  HGB 11.6*    Recent Labs  03/24/14 0425  WBC 16.4*  RBC 3.98  HCT 34.7*  PLT 206    Recent Labs  03/24/14 0425  NA 135*  K 4.2  CL 99  CO2 27  BUN 10  CREATININE 0.59  GLUCOSE 162*  CALCIUM 9.2   No results for input(s): LABPT, INR in the last 72 hours.  EXAM General - Patient is Alert, Appropriate and Oriented Extremity - Neurovascular intact Sensation intact distally Dorsiflexion/Plantar flexion intact Dressing - dressing C/D/I Motor Function - intact, moving foot and toes well on exam.  Hemovac pulled without difficulty.  Past Medical History  Diagnosis Date  . Paroxysmal a-fib   . Family history of adverse reaction to anesthesia     son has had nausea and vomiting   . Dysrhythmia     paroxsymal A Fib   . Sleep apnea     uses mouth guard  . Pneumonia     hx of   . Bronchitis     hx of   . GERD (gastroesophageal reflux disease)   . Headache     hx of migraines  . Cancer     hx of skin cancers upper arms bilat   . Chronic cystitis   . Shingles     hx of   . Hypercholesterolemia     . Varicose veins     hx of   . Urinary tract infection     hx of   . Measles     hx of   . Mumps     hx of   . Degenerative lumbar disc     hx of   . Osteoarthritis     left knee  . Chronic low back pain   . Hemorrhoids     hx of     Assessment/Plan: 1 Day Post-Op Procedure(s) (LRB): LEFT TOTAL KNEE ARTHROPLASTY  (Left) Principal Problem:   OA (osteoarthritis) of knee  Estimated body mass index is 43.5 kg/(m^2) as calculated from the following:   Height as of this encounter: 5\' 8"  (1.727 m).   Weight as of this encounter: 129.729 kg (286 lb). Advance diet Up with therapy Plan for discharge tomorrow Discharge home with home health  DVT Prophylaxis - Xarelto for now.  Will resume Eliquis after discharge. Weight-Bearing as tolerated to left leg D/C O2 and Pulse OX and try on Room Air  [name], PA-C Orthopaedic Surgery 03/24/2014, 7:49 AM

## 2014-03-24 NOTE — Progress Notes (Signed)
Physical Therapy Treatment Patient Details Name: Brandi Rodgers MRN: [id number] DOB: Jan 10, 1951 Today's Date: 03/24/2014    History of Present Illness L TKA; cortisone injection R knee    PT Comments    Progressing, likely D/C in am  Follow Up Recommendations  Home health PT     Equipment Recommendations  None recommended by PT    Recommendations for Other Services       Precautions / Restrictions Precautions Precautions: Knee Precaution Comments: pt able to do I SLR Required Braces or Orthoses: Knee Immobilizer - Left Knee Immobilizer - Right: Discontinue once straight leg raise with < 10 degree lag Restrictions Weight Bearing Restrictions: No Other Position/Activity Restrictions: WBAT    Mobility  Bed Mobility Overal bed mobility: Needs Assistance Bed Mobility: Sit to Supine     Supine to sit: Min assist Sit to supine: Min assist   General bed mobility comments: assist with RLE, cues for technique, self assist  Transfers Overall transfer level: Needs assistance Equipment used: Rolling walker (2 wheeled) Transfers: Sit to/from Stand Sit to Stand: Supervision         General transfer comment: verbal cues for hand placement,  control of descent, and LLE position  Ambulation/Gait Ambulation/Gait assistance: Supervision;Min guard Ambulation Distance (Feet): 120 Feet Assistive device: Rolling walker (2 wheeled) Gait Pattern/deviations: Step-to pattern;Antalgic     General Gait Details: cues for sequence, RW distance from self   Stairs            Wheelchair Mobility    Modified Rankin (Stroke Patients Only)       Balance Overall balance assessment: No apparent balance deficits (not formally assessed)                                  Cognition Arousal/Alertness: Awake/alert Behavior During Therapy: WFL for tasks assessed/performed Overall Cognitive Status: Within Functional Limits for tasks assessed                       Exercises Total Joint Exercises Ankle Circles/Pumps: AROM;Both;10 reps Quad Sets: AROM;Both;10 reps Heel Slides: AAROM;Left;10 reps Hip ABduction/ADduction: AAROM;Left;10 reps Straight Leg Raises: AROM;Left;10 reps    General Comments        Pertinent Vitals/Pain Pain Assessment: 0-10 Pain Score: 2  Pain Location: left knee Pain Descriptors / Indicators: Aching Pain Intervention(s): Repositioned;Premedicated before session    Home Living Family/patient expects to be discharged to:: Private residence Living Arrangements: Spouse/significant other Available Help at Discharge: Family Type of Home: House Home Access: Stairs to enter Entrance Stairs-Rails: Right Home Layout: One level Home Equipment: Environmental consultant - 2 wheels;Walker - standard;Walker - 4 wheels;Cane - single point;Wheelchair - manual;Toilet riser Additional Comments: may need riser for second toilet    Prior Function Level of Independence: Independent          PT Goals (current goals can now be found in the care plan section) Acute Rehab PT Goals Patient Stated Goal: return to I PT Goal Formulation: With patient Time For Goal Achievement: 03/30/14 Potential to Achieve Goals: Good Progress towards PT goals: Progressing toward goals    Frequency  7X/week    PT Plan Current plan remains appropriate    Co-evaluation             End of Session Equipment Utilized During Treatment: Gait belt Activity Tolerance: Patient tolerated treatment well Patient left: in bed;with call bell/phone within reach  Time: 1330-1400 PT Time Calculation (min) (ACUTE ONLY): 30 min  Charges:  $Gait Training: 8-22 mins $Therapeutic Exercise: 8-22 mins                    G Codes:      Ferguson Gertner 03/28/2014, 2:10 PM

## 2014-03-24 NOTE — Care Management Note (Signed)
    Page 1 of 2   03/24/2014     12:03:45 PM CARE MANAGEMENT NOTE 03/24/2014  Patient:  Brandi Rodgers, Brandi Rodgers   Account Number:  [id number]  Date Initiated:  03/24/2014  Documentation initiated by:  [facility]  Subjective/Objective Assessment:   adm: LEFT TOTAL KNEE ARTHROPLASTY  (Left)     Action/Plan:   discharge planning   Anticipated DC Date:  03/25/2014   Anticipated DC Plan:  [place]  CM consult      [facility] Choice  HOME HEALTH   Choice offered to / List presented to:  C-1 Patient   DME arranged  NA      DME agency  NA     [place] arranged  HH-2 PT      [place]   Status of service:  Completed, signed off Medicare Important Message given?   (If response is "NO", the following Medicare IM given date fields will be blank) Date Medicare IM given:   Medicare IM given by:   Date Additional Medicare IM given:   Additional Medicare IM given by:    Discharge Disposition:  [place]  Per UR Regulation:    If discussed at Long Length of Stay Meetings, dates discussed:    Comments:  03/24/14 11:45 CM met with pt in room to offer choice of home health agency.  Pt chooses [facility] to render HHPT.  Pt states she has a rolling walker and elevated toilet seats with handles at home.  Address and contact information verified with pt. Referral called to [facility].  No other CM needs were communicated.  Mariane Masters, BSN, CM [phone].

## 2014-03-24 NOTE — Evaluation (Signed)
Occupational Therapy Evaluation and Discharge Summary Patient Details Name: Brandi Rodgers MRN: [id number] DOB: 07/04/1950 Today's Date: 03/24/2014    History of Present Illness L TKA; cortisone injection R knee   Clinical Impression   Pt admitted with the above diagnosis and overall is doing fairly well with adls.  All adl education complete.  Pt aware of where to purchase adaptive equipment for LE adls and has husband available to assist for the first week.  Do not feel that pt has acute OT needs at this time.     Follow Up Recommendations  No OT follow up    Equipment Recommendations  Toilet riser;Other (comment) (for second toilet)    Recommendations for Other Services       Precautions / Restrictions Precautions Precautions: Knee Required Braces or Orthoses: Knee Immobilizer - Left Knee Immobilizer - Right: Discontinue once straight leg raise with < 10 degree lag Restrictions Weight Bearing Restrictions: No Other Position/Activity Restrictions: WBAT      Mobility Bed Mobility Overal bed mobility:  (pt in chair on arriva.) Bed Mobility: Supine to Sit     Supine to sit: Min assist     General bed mobility comments: assist with RLE, cues for technique, self assist  Transfers Overall transfer level: Needs assistance Equipment used: Rolling walker (2 wheeled) Transfers: Sit to/from Stand Sit to Stand: Supervision         General transfer comment: verbal cues for hand placement,  control of descent, and LLE position    Balance Overall balance assessment: No apparent balance deficits (not formally assessed)                                          ADL Overall ADL's : Needs assistance/impaired Eating/Feeding: Independent   Grooming: Wash/dry hands;Oral care;Supervision/safety;Standing Grooming Details (indicate cue type and reason): standing at sink for 5 min S Upper Body Bathing: Set up;Sitting   Lower Body Bathing: Minimal  assistance;Sit to/from stand Lower Body Bathing Details (indicate cue type and reason): min assist to reach L foot and to stand and wash peri area. Upper Body Dressing : Set up;Sitting   Lower Body Dressing: Minimal assistance;Sit to/from stand Lower Body Dressing Details (indicate cue type and reason): min assist for L sock and shoe.  S to stand to pull up pants and maintain balance. Rec pt keep walker in front of her at all times in case she needs to reach for something to keep her balance. Toilet Transfer: Supervision/safety;Cueing for Designer, industrial/product Details (indicate cue type and reason): 3:1 over commode.  Cues to push up from toilet not walker when standing. Toileting- Clothing Manipulation and Hygiene: Supervision/safety;Sit to/from stand   Tub/ Shower Transfer: Walk-in shower;Minimal assistance   Functional mobility during ADLs: Supervision/safety;Rolling walker General ADL Comments: Pt did very well with adls.  Husband present.  Pt will need assist at first with L sock and shoe. Pt demonstrated independence with sock aid during session.  Husband instructed where to purchase if they decided they needed one.  Therapist feels it would be helpful to pt.     Vision                     Perception     Praxis      Pertinent Vitals/Pain Pain Assessment: 0-10 Pain Score: 3  Pain Location: L knee Pain Descriptors / Indicators: Aching  Pain Intervention(s): Repositioned;Monitored during session;Ice applied     Hand Dominance Right   Extremity/Trunk Assessment Upper Extremity Assessment Upper Extremity Assessment: Overall WFL for tasks assessed   Lower Extremity Assessment Lower Extremity Assessment: Defer to PT evaluation RLE Deficits / Details: able to do I SLR, 3/5 hip flexion and knee extension; ankle WFL   Cervical / Trunk Assessment Cervical / Trunk Assessment: Normal   Communication Communication Communication: No difficulties   Cognition  Arousal/Alertness: Awake/alert Behavior During Therapy: WFL for tasks assessed/performed Overall Cognitive Status: Within Functional Limits for tasks assessed                     General Comments       Exercises       Shoulder Instructions      Home Living Family/patient expects to be discharged to:: Private residence Living Arrangements: Spouse/significant other Available Help at Discharge: Family Type of Home: House Home Access: Stairs to enter Technical brewer of Steps: 3 Entrance Stairs-Rails: Right Home Layout: One level     Bathroom Shower/Tub: Walk-in shower;Door   [employer] Toilet: Standard Bathroom Accessibility: Yes How Accessible: Accessible via walker Home Equipment: Walker - 2 wheels;Walker - standard;Walker - 4 wheels;Cane - single point;Wheelchair - manual;Toilet riser   Additional Comments: may need riser for second toilet      Prior Functioning/Environment Level of Independence: Independent             OT Diagnosis:     OT Problem List:     OT Treatment/Interventions:      OT Goals(Current goals can be found in the care plan section) Acute Rehab OT Goals Patient Stated Goal: return to I  OT Frequency:     Barriers to D/C:            Co-evaluation              End of Session Equipment Utilized During Treatment: Rolling walker Nurse Communication: Mobility status  Activity Tolerance: Patient tolerated treatment well Patient left: in chair;with call bell/phone within reach;with family/visitor present   Time: 6286-3817 OT Time Calculation (min): 39 min Charges:  OT General Charges $OT Visit: 1 Procedure OT Evaluation $Initial OT Evaluation Tier I: 1 Procedure OT Treatments $Self Care/Home Management : 23-37 mins G-Codes:    Glenford Peers 04-12-2014, 11:40 AM  [phone]

## 2014-03-24 NOTE — Plan of Care (Signed)
Problem: Phase I Progression Outcomes Goal: CMS/Neurovascular status WDL Outcome: Completed/Met Date Met:  03/24/14 Goal: Pain controlled with appropriate interventions Outcome: Completed/Met Date Met:  03/24/14 Goal: Dangle or out of bed evening of surgery Outcome: Completed/Met Date Met:  03/24/14 Goal: Initial discharge plan identified Outcome: Completed/Met Date Met:  03/24/14 Goal: Hemodynamically stable Outcome: Completed/Met Date Met:  03/24/14 Goal: Other Phase I Outcomes/Goals Outcome: Not Applicable Date Met:  42/32/00  Problem: Phase II Progression Outcomes Goal: Tolerating diet Outcome: Completed/Met Date Met:  03/24/14 Goal: Discharge plan established Outcome: Completed/Met Date Met:  03/24/14 Goal: Other Phase II Outcomes/Goals Outcome: Not Applicable Date Met:  94/17/91

## 2014-03-25 LAB — CBC
HEMATOCRIT: 31.1 % — AB (ref 36.0–46.0)
Hemoglobin: 10.3 g/dL — ABNORMAL LOW (ref 12.0–15.0)
MCH: 29.2 pg (ref 26.0–34.0)
MCHC: 33.1 g/dL (ref 30.0–36.0)
MCV: 88.1 fL (ref 78.0–100.0)
PLATELETS: 217 10*3/uL (ref 150–400)
RBC: 3.53 MIL/uL — ABNORMAL LOW (ref 3.87–5.11)
RDW: 13.6 % (ref 11.5–15.5)
WBC: 18.5 10*3/uL — ABNORMAL HIGH (ref 4.0–10.5)

## 2014-03-25 LAB — BASIC METABOLIC PANEL
ANION GAP: 8 (ref 5–15)
BUN: 15 mg/dL (ref 6–23)
CO2: 29 mEq/L (ref 19–32)
CREATININE: 0.63 mg/dL (ref 0.50–1.10)
Calcium: 9.1 mg/dL (ref 8.4–10.5)
Chloride: 100 mEq/L (ref 96–112)
Glucose, Bld: 127 mg/dL — ABNORMAL HIGH (ref 70–99)
Potassium: 4.3 mEq/L (ref 3.7–5.3)
Sodium: 137 mEq/L (ref 137–147)

## 2014-03-25 NOTE — Progress Notes (Signed)
Physical Therapy Treatment Patient Details Name: Brandi Rodgers MRN: [id number] DOB: 03/07/1951 Today's Date: 2014/03/29    History of Present Illness L TKA; cortisone injection R knee    PT Comments    Pt tolerating therex well.  Ambulation/stairs deferred to after lunch at pt request.  Follow Up Recommendations  Home health PT     Equipment Recommendations  None recommended by PT    Recommendations for Other Services       Precautions / Restrictions Precautions Precautions: Knee Precaution Comments: pt able to do I SLR Required Braces or Orthoses: Knee Immobilizer - Left Knee Immobilizer - Right: Discontinue once straight leg raise with < 10 degree lag Restrictions Weight Bearing Restrictions: No Other Position/Activity Restrictions: WBAT    Mobility  Bed Mobility                  Transfers                    Ambulation/Gait                 Stairs            Wheelchair Mobility    Modified Rankin (Stroke Patients Only)       Balance                                    Cognition Arousal/Alertness: Awake/alert Behavior During Therapy: WFL for tasks assessed/performed Overall Cognitive Status: Within Functional Limits for tasks assessed                      Exercises Total Joint Exercises Ankle Circles/Pumps: AROM;Both;10 reps;15 reps;Supine Quad Sets: AROM;Both;20 reps;Supine Heel Slides: AAROM;Left;Supine;20 reps Straight Leg Raises: Left;AAROM;AROM;20 reps;Supine    General Comments        Pertinent Vitals/Pain Pain Assessment: Faces Pain Score: 3  Pain Location: L knee Pain Descriptors / Indicators: Aching Pain Intervention(s): Limited activity within patient's tolerance;Monitored during session;Premedicated before session;Ice applied    Home Living                      Prior Function            PT Goals (current goals can now be found in the care plan section) Acute  Rehab PT Goals Patient Stated Goal: return to I PT Goal Formulation: With patient Time For Goal Achievement: 03/30/14 Potential to Achieve Goals: Good Progress towards PT goals: Progressing toward goals    Frequency  7X/week    PT Plan Current plan remains appropriate    Co-evaluation             End of Session Equipment Utilized During Treatment: Gait belt Activity Tolerance: Patient tolerated treatment well Patient left: in chair;with call bell/phone within reach;with family/visitor present     Time: 4098-1191 PT Time Calculation (min) (ACUTE ONLY): 18 min  Charges:  $Therapeutic Exercise: 8-22 mins                    G Codes:      Brandi Rodgers March 29, 2014, 12:43 PM

## 2014-03-25 NOTE — Plan of Care (Signed)
Problem: Phase II Progression Outcomes Goal: Ambulates Outcome: Completed/Met Date Met:  03/25/14

## 2014-03-25 NOTE — Progress Notes (Signed)
Physical Therapy Treatment Patient Details Name: Brandi Rodgers MRN: [id number] DOB: Nov 03, 1950 Today's Date: 03/25/2014    History of Present Illness L TKA; cortisone injection R knee    PT Comments    Pt progressing well and eager for return home  Follow Up Recommendations  Home health PT     Equipment Recommendations  None recommended by PT    Recommendations for Other Services       Precautions / Restrictions Precautions Precautions: Knee Precaution Comments: pt able to do I SLR Required Braces or Orthoses: Knee Immobilizer - Left Knee Immobilizer - Right: Discontinue once straight leg raise with < 10 degree lag Restrictions Weight Bearing Restrictions: No Other Position/Activity Restrictions: WBAT    Mobility  Bed Mobility                  Transfers Overall transfer level: Needs assistance Equipment used: Rolling walker (2 wheeled) Transfers: Sit to/from Stand Sit to Stand: Supervision         General transfer comment: verbal cues for hand placement,  control of descent, and LLE position  Ambulation/Gait Ambulation/Gait assistance: Min guard;Supervision Ambulation Distance (Feet): 123 Feet Assistive device: Rolling walker (2 wheeled) Gait Pattern/deviations: Step-to pattern;Decreased step length - right;Decreased step length - left;Shuffle;Trunk flexed     General Gait Details: cues for sequence, RW distance from self   Stairs Stairs: Yes Stairs assistance: Min assist Stair Management: One rail Left;Step to pattern;Forwards;With cane Number of Stairs: 5 General stair comments: cues for sequence and foot/cane placement.  Spouse assisting   Wheelchair Mobility    Modified Rankin (Stroke Patients Only)       Balance                                    Cognition Arousal/Alertness: Awake/alert Behavior During Therapy: WFL for tasks assessed/performed Overall Cognitive Status: Within Functional Limits for tasks  assessed                      Exercises Total Joint Exercises Ankle Circles/Pumps: AROM;Both;10 reps;15 reps;Supine Quad Sets: AROM;Both;20 reps;Supine Heel Slides: AAROM;Left;Supine;20 reps Straight Leg Raises: Left;AAROM;AROM;20 reps;Supine    General Comments        Pertinent Vitals/Pain Pain Assessment: 0-10 Pain Score: 2  Pain Location: L knee Pain Descriptors / Indicators: Aching Pain Intervention(s): Limited activity within patient's tolerance;Monitored during session;Premedicated before session;Ice applied    Home Living                      Prior Function            PT Goals (current goals can now be found in the care plan section) Acute Rehab PT Goals Patient Stated Goal: return to I PT Goal Formulation: With patient Time For Goal Achievement: 03/30/14 Potential to Achieve Goals: Good Progress towards PT goals: Progressing toward goals    Frequency  7X/week    PT Plan Current plan remains appropriate    Co-evaluation             End of Session Equipment Utilized During Treatment: Gait belt Activity Tolerance: Patient tolerated treatment well Patient left: in chair;with call bell/phone within reach;with family/visitor present     Time: 9357-0177 PT Time Calculation (min) (ACUTE ONLY): 17 min  Charges:  $Gait Training: 8-22 mins $Therapeutic Exercise: 8-22 mins  G Codes:      Rivers Hamrick 2014-04-01, 12:47 PM

## 2014-03-25 NOTE — Progress Notes (Signed)
Subjective: 2 Days Post-Op Procedure(s) (LRB): LEFT TOTAL KNEE ARTHROPLASTY  (Left) Patient reports pain as mild.   Patient seen in rounds with Dr. Wynelle Link. Patient is well, and has had no acute complaints or problems Patient is ready to go home  Objective: Vital signs in last 24 hours: Temp:  [97.8 F (36.6 C)-99 F (37.2 C)] 98.8 F (37.1 C) (11/11 0523) Pulse Rate:  [57-79] 79 (11/11 0523) Resp:  [15-18] 16 (11/11 0523) BP: (129-144)/(55-67) 141/67 mmHg (11/11 0523) SpO2:  [92 %-98 %] 92 % (11/11 0523)  Intake/Output from previous day:  Intake/Output Summary (Last 24 hours) at 03/25/14 0803 Last data filed at 03/25/14 0656  Gross per 24 hour  Intake 741.75 ml  Output   3750 ml  Net -3008.25 ml    Labs:  Recent Labs  03/24/14 0425 03/25/14 0453  HGB 11.6* 10.3*    Recent Labs  03/24/14 0425 03/25/14 0453  WBC 16.4* 18.5*  RBC 3.98 3.53*  HCT 34.7* 31.1*  PLT 206 217    Recent Labs  03/24/14 0425 03/25/14 0453  NA 135* 137  K 4.2 4.3  CL 99 100  CO2 27 29  BUN 10 15  CREATININE 0.59 0.63  GLUCOSE 162* 127*  CALCIUM 9.2 9.1   No results for input(s): LABPT, INR in the last 72 hours.  EXAM: General - Patient is Alert, Appropriate and Oriented Extremity - Neurovascular intact Sensation intact distally Dorsiflexion/Plantar flexion intact Incision - clean, dry, no drainage, healing Motor Function - intact, moving foot and toes well on exam.   Assessment/Plan: 2 Days Post-Op Procedure(s) (LRB): LEFT TOTAL KNEE ARTHROPLASTY  (Left) Procedure(s) (LRB): LEFT TOTAL KNEE ARTHROPLASTY  (Left) Past Medical History  Diagnosis Date  . Paroxysmal a-fib   . Family history of adverse reaction to anesthesia     son has had nausea and vomiting   . Dysrhythmia     paroxsymal A Fib   . Sleep apnea     uses mouth guard  . Pneumonia     hx of   . Bronchitis     hx of   . GERD (gastroesophageal reflux disease)   . Headache     hx of migraines  .  Cancer     hx of skin cancers upper arms bilat   . Chronic cystitis   . Shingles     hx of   . Hypercholesterolemia   . Varicose veins     hx of   . Urinary tract infection     hx of   . Measles     hx of   . Mumps     hx of   . Degenerative lumbar disc     hx of   . Osteoarthritis     left knee  . Chronic low back pain   . Hemorrhoids     hx of    Principal Problem:   OA (osteoarthritis) of knee  Estimated body mass index is 43.5 kg/(m^2) as calculated from the following:   Height as of this encounter: 5\' 8"  (1.727 m).   Weight as of this encounter: 129.729 kg (286 lb). Up with therapy Discharge home with home health Diet - Cardiac diet Follow up - in 2 weeks Activity - WBAT Disposition - Home Condition Upon Discharge - Good D/C Meds - See DC Summary DVT Prophylaxis - Xarelto last dose her today, Will resume Eliquis at home starting tomorrow.  [name], PA-C Orthopaedic Surgery 03/25/2014, 8:03  AM    

## 2014-05-05 ENCOUNTER — Telehealth: Payer: Self-pay | Admitting: Cardiology

## 2014-05-05 NOTE — Telephone Encounter (Signed)
Received request from Nurse fax box, documents faxed for surgical clearance. To: [facility] Fax number: [phone] Attention: 12.22.15/km

## 2014-05-20 ENCOUNTER — Ambulatory Visit (INDEPENDENT_AMBULATORY_CARE_PROVIDER_SITE_OTHER): Payer: BC Managed Care – PPO | Admitting: Cardiology

## 2014-05-20 ENCOUNTER — Other Ambulatory Visit: Payer: Self-pay | Admitting: *Deleted

## 2014-05-20 ENCOUNTER — Encounter: Payer: Self-pay | Admitting: Cardiology

## 2014-05-20 VITALS — BP 122/88 | HR 61 | Ht 68.0 in | Wt 273.0 lb

## 2014-05-20 DIAGNOSIS — M8949 Other hypertrophic osteoarthropathy, multiple sites: Secondary | ICD-10-CM

## 2014-05-20 DIAGNOSIS — M150 Primary generalized (osteo)arthritis: Secondary | ICD-10-CM

## 2014-05-20 DIAGNOSIS — I480 Paroxysmal atrial fibrillation: Secondary | ICD-10-CM

## 2014-05-20 DIAGNOSIS — M159 Polyosteoarthritis, unspecified: Secondary | ICD-10-CM

## 2014-05-20 DIAGNOSIS — I10 Essential (primary) hypertension: Secondary | ICD-10-CM

## 2014-05-20 NOTE — Patient Instructions (Signed)
Your physician recommends that you continue on your current medications as directed. Please refer to the Current Medication list given to you today.  Your physician wants you to follow-up in: 6 month ov You will receive a reminder letter in the mail two months in advance. If you don't receive a letter, please call our office to schedule the follow-up appointment.  

## 2014-05-20 NOTE — Progress Notes (Signed)
Brandi Rodgers Date of Birth:  06-03-1950 [place] [address] [place], [place]  [address] [phone]        Fax   [phone]   History of Present Illness: This pleasant 64 year old woman is seen for a follow-up office visit.[name]  She was initially seen several months ago at the request of Dr. Jonathon Jordan for evaluation of paroxysmal atrial fibrillation.  The patient has a history of intermittent episodes of rapid irregular heart rate dating back 10 years.  The episodes occur infrequently.  On 01/29/14 she was seen at her PCP office and had documented atrial fibrillation with a ventricular rate of 111.  I reviewed  the electrocardiogram and concur with the interpretation.  That was her last episode of atrial fibrillation.  Normally she has about 1 episode or less per month.  Each episode typically lasts several hours.  She normally will take an aspirin and will rest. Her most recent episode of atrial fibrillation on 01/29/14 occurred after a trip to the beach with some friends.  She drank a lot of caffeinated drinks and chocolate prior to the onset of the atrial fibrillation on medication. The patient does not have any history of ischemic heart disease.  She does not have any history of exertional chest pain.  She has had a history of recent mild peripheral edema and her dermatologist recently gave her some Lasix to have on hand. The patient has a history of migraine headaches and is followed at the headache clinic.  She is on gabapentin for prevention of headaches. The patient has a history of osteoarthritis of the knees and underwent a left total knee replacement by Dr. Wynelle Link on March 23, 2014.  She is planning to have the right knee done in April .Her family history reveals that both parents are deceased.  Her father died at 49 of COPD and her mother died at 5 of COPD but also had atrial fibrillation. She has a history of migraine headaches which have improved  since last visit. She has a history of mild hypercholesterolemia.  She is not presently on any statins.  She is due for a complete physical with lab work from her PCP in February.  Current Outpatient Prescriptions  Medication Sig Dispense Refill  . apixaban (ELIQUIS) 5 MG TABS tablet Take 1 tablet (5 mg total) by mouth 2 (two) times daily. 60 tablet 5  . atenolol (TENORMIN) 50 MG tablet Take 1 tablet by mouth every morning.     . Calcium-Vitamin D-Vitamin K (VIACTIV PO) Take 1 tablet by mouth daily.    . furosemide (LASIX) 20 MG tablet Take 20 mg by mouth daily as needed for fluid.     [name] gabapentin (NEURONTIN) 600 MG tablet Take 600 mg by mouth 3 (three) times daily.    . Multiple Vitamin (MULTIVITAMIN) tablet Take 1 tablet by mouth daily.    . Polyvinyl Alcohol-Povidone (REFRESH OP) Apply 1 drop to eye 2 (two) times daily as needed (for dry eyes).     . rizatriptan (MAXALT-MLT) 10 MG disintegrating tablet Take 10 mg by mouth as needed for migraine. May repeat in 2 hours if needed    . traMADol (ULTRAM) 50 MG tablet Take 1-2 tablets (50-100 mg total) by mouth every 6 (six) hours as needed (mild pain). 60 tablet 1   No current facility-administered medications for this visit.    Allergies  Allergen Reactions  . Methocarbamol Hives and Other (See Comments)  Blisters  . Tape     Patient Active Problem List   Diagnosis Date Noted  . OA (osteoarthritis) of knee 03/23/2014  . Paroxysmal atrial fibrillation 03/06/2014  . Essential hypertension 03/06/2014  . Hypercholesterolemia 03/06/2014  . Osteoarthritis 03/06/2014  . Migraine headache 03/06/2014    History  Smoking status  . Never Smoker   Smokeless tobacco  . Never Used    History  Alcohol Use  . Yes    Comment: occ      Review of Systems: Constitutional: no fever chills diaphoresis or fatigue or change in weight.  Head and neck: no hearing loss, no epistaxis, no photophobia or visual disturbance. Respiratory: No  cough, shortness of breath or wheezing. Cardiovascular: No chest pain peripheral edema, positive for intermittent palpitations Gastrointestinal: No abdominal distention, no abdominal pain, no change in bowel habits hematochezia or melena. Genitourinary: No dysuria, no frequency, no urgency, no nocturia. Musculoskeletal:No arthralgias, no back pain, no gait disturbance or myalgias. Neurological: No dizziness, no headaches, no numbness, no seizures, no syncope, no weakness, no tremors. Hematologic: No lymphadenopathy, no easy bruising. Psychiatric: No confusion, no hallucinations, no sleep disturbance.    Physical Exam: Filed Vitals:   05/20/14 0856  BP: 122/88  Pulse: 61  The patient appears to be in no distress.  Head and neck exam reveals that the pupils are equal and reactive.  The extraocular movements are full.  There is no scleral icterus.  Mouth and pharynx are benign.  No lymphadenopathy.  No carotid bruits.  The jugular venous pressure is normal.  Thyroid is not enlarged or tender.  Chest is clear to percussion and auscultation.  No rales or rhonchi.  Expansion of the chest is symmetrical.  Heart reveals no abnormal lift or heave.  First and second heart sounds are normal.  There is no murmur gallop rub or click.  The pulse is regular  The abdomen is soft and nontender.  Bowel sounds are normoactive.  There is no hepatosplenomegaly or mass.  There are no abdominal bruits.  Extremities reveal no phlebitis.  There is mild bilateral peripheral edema. Pedal pulses are good.  There is no cyanosis or clubbing.  Neurologic exam is normal strength and no lateralizing weakness.  No sensory deficits.  Integument reveals no rash  EKG today shows sinus bradycardia at 61 per minute and is within normal limits otherwise.  Since last tracing of 03/06/14, no significant change  Assessment / Plan: 1. paroxysmal atrial fibrillation documented by EKG on 01/29/14 at PCP office. Chadssvasc score  of at least 3.  She had some brief postoperative palpitations lasting 10-15 minutes on the first day after returning home from surgery.  Otherwise no recurrent atrial fib. 2. osteoarthritis of the knee, status post successful left total knee replacement in November 2015 and anticipating right total knee replacement next April 2016 3. Hyperlipidemia controlled with diet alone at this point 4. Obesity.  I commended her on her 18 pound weight loss since last visit 5. essential hypertension.  Controlled. 6. peripheral edema, improved.  She had an echocardiogram 03/10/14 which showed normal left ventricular systolic function with ejection fraction 55-60% and no significant valve abnormalities and a pulmonary artery pressure of 41   Disposition: Continue Apixaban 5 mg twice a day.  Continue current medication.  Recheck in 6 months for office visit.

## 2014-07-02 ENCOUNTER — Telehealth: Payer: Self-pay | Admitting: Cardiology

## 2014-07-02 NOTE — Telephone Encounter (Signed)
The patient should leave off her Eliquis for 48 hours prior to the procedure.

## 2014-07-02 NOTE — Telephone Encounter (Signed)
Advised Deanna and will fax over

## 2014-07-02 NOTE — Telephone Encounter (Signed)
Patient takes Eliquis 5 mg twice a day  Will forward to  Dr. Mare Ferrari for review

## 2014-07-02 NOTE — Telephone Encounter (Signed)
Request for surgical clearance:  1. What type of surgery is being performed? Facet back injection    2.   When is this surgery scheduled? 07/21/2014  3.   Are there any medications that need to be held prior to surgery and how long  4.   Name of physician performing surgery? Dr. Suella Broad  5.   What is your office phone and fax number? Fax # [phone]

## 2014-07-15 ENCOUNTER — Telehealth: Payer: Self-pay | Admitting: *Deleted

## 2014-07-15 NOTE — Telephone Encounter (Signed)
Will forward to  Dr. Brackbill for review 

## 2014-07-15 NOTE — Telephone Encounter (Signed)
-----   Message from [place] sent at 07/15/2014 12:25 PM EST ----- Nickey Kloepfer,   [facility] called asking if patient could come off Eliquis x3 days before Injection.  Injection is scheduled for March 8th  Please call back at [phone]  Or fax [phone].  Thanks, Maudie Mercury

## 2014-07-15 NOTE — Telephone Encounter (Signed)
Yes, okay to hold Eliquis 3 days before procedure

## 2014-07-16 NOTE — Telephone Encounter (Signed)
Faxed yesterday, received confirmation Left message on Deanna's VM if did not receive to call back

## 2014-07-30 ENCOUNTER — Ambulatory Visit: Payer: Self-pay | Admitting: Orthopedic Surgery

## 2014-07-30 NOTE — Progress Notes (Signed)
Preoperative surgical orders have been place into the Epic hospital system for Brandi Rodgers on 07/30/2014, 2:14 PM  by Mickel Crow for surgery on 08-21-2014.  Preop Total Knee orders including Experal, IV Tylenol, and IV Decadron as long as there are no contraindications to the above medications. [name], PA-C

## 2014-08-12 ENCOUNTER — Other Ambulatory Visit (HOSPITAL_COMMUNITY): Payer: Self-pay | Admitting: *Deleted

## 2014-08-12 NOTE — Progress Notes (Signed)
03/06/2014-Pre-operative medical clearance from Dr. Mare Ferrari on chart. 08/04/2014-LOV and pre-operative medical clearance on chart from Dr. Cheron Schaumann.

## 2014-08-12 NOTE — Patient Instructions (Signed)
CADANCE RAUS  08/12/2014   Your procedure is scheduled on: Friday 08/21/2014  Report to [facility] Main  Entrance and follow signs to               [place] at 115 PM.  Call this number if you have problems the morning of surgery [phone]   Remember:  Do not eat food  :After Midnight.  MAY HAVE CLEAR LIQUIDS FROM MIDNIGHT UP UNTIL 0915 AM MORNING OF SURGERY THEN NOTHING UNTIL AFTER SURGERY!     Take these medicines the morning of surgery with A SIP OF WATER: Atenolol, Gabapentin                               You may not have any metal on your body including hair pins and              piercings  Do not wear jewelry, make-up, lotions, powders or perfumes.             Do not wear nail polish.  Do not shave  48 hours prior to surgery.              Men may shave face and neck.   Do not bring valuables to the hospital. [place].  Contacts, dentures or bridgework may not be worn into surgery.  Leave suitcase in the car. After surgery it may be brought to your room.     Patients discharged the day of surgery will not be allowed to drive home.  Name and phone number of your driver:  Special Instructions: N/A              Please read over the following fact sheets you were given: _____________________________________________________________________             [facility] - Preparing for Surgery Before surgery, you can play an important role.  Because skin is not sterile, your skin needs to be as free of germs as possible.  You can reduce the number of germs on your skin by washing with CHG (chlorahexidine gluconate) soap before surgery.  CHG is an antiseptic cleaner which kills germs and bonds with the skin to continue killing germs even after washing. Please DO NOT use if you have an allergy to CHG or antibacterial soaps.  If your skin becomes reddened/irritated stop using the CHG and inform your  nurse when you arrive at Short Stay. Do not shave (including legs and underarms) for at least 48 hours prior to the first CHG shower.  You may shave your face/neck. Please follow these instructions carefully:  1.  Shower with CHG Soap the night before surgery and the  morning of Surgery.  2.  If you choose to wash your hair, wash your hair first as usual with your  normal  shampoo.  3.  After you shampoo, rinse your hair and body thoroughly to remove the  shampoo.                           4.  Use CHG as you would any other liquid soap.  You can apply chg directly  to the skin and wash  Gently with a scrungie or clean washcloth.  5.  Apply the CHG Soap to your body ONLY FROM THE NECK DOWN.   Do not use on face/ open                           Wound or open sores. Avoid contact with eyes, ears mouth and genitals (private parts).                       Wash face,  Genitals (private parts) with your normal soap.             6.  Wash thoroughly, paying special attention to the area where your surgery  will be performed.  7.  Thoroughly rinse your body with warm water from the neck down.  8.  DO NOT shower/wash with your normal soap after using and rinsing off  the CHG Soap.                9.  Pat yourself dry with a clean towel.            10.  Wear clean pajamas.            11.  Place clean sheets on your bed the night of your first shower and do not  sleep with pets. Day of Surgery : Do not apply any lotions/deodorants the morning of surgery.  Please wear clean clothes to the hospital/surgery center.  FAILURE TO FOLLOW THESE INSTRUCTIONS MAY RESULT IN THE CANCELLATION OF YOUR SURGERY PATIENT SIGNATURE_________________________________  NURSE SIGNATURE__________________________________  ________________________________________________________________________   Adam Phenix  An incentive spirometer is a tool that can help keep your lungs clear and active. This tool  measures how well you are filling your lungs with each breath. Taking long deep breaths may help reverse or decrease the chance of developing breathing (pulmonary) problems (especially infection) following:  A long period of time when you are unable to move or be active. BEFORE THE PROCEDURE   If the spirometer includes an indicator to show your best effort, your nurse or respiratory therapist will set it to a desired goal.  If possible, sit up straight or lean slightly forward. Try not to slouch.  Hold the incentive spirometer in an upright position. INSTRUCTIONS FOR USE   Sit on the edge of your bed if possible, or sit up as far as you can in bed or on a chair.  Hold the incentive spirometer in an upright position.  Breathe out normally.  Place the mouthpiece in your mouth and seal your lips tightly around it.  Breathe in slowly and as deeply as possible, raising the piston or the ball toward the top of the column.  Hold your breath for 3-5 seconds or for as long as possible. Allow the piston or ball to fall to the bottom of the column.  Remove the mouthpiece from your mouth and breathe out normally.  Rest for a few seconds and repeat Steps 1 through 7 at least 10 times every 1-2 hours when you are awake. Take your time and take a few normal breaths between deep breaths.  The spirometer may include an indicator to show your best effort. Use the indicator as a goal to work toward during each repetition.  After each set of 10 deep breaths, practice coughing to be sure your lungs are clear. If you have an incision (the cut made at the time of surgery),  support your incision when coughing by placing a pillow or rolled up towels firmly against it. Once you are able to get out of bed, walk around indoors and cough well. You may stop using the incentive spirometer when instructed by your caregiver.  RISKS AND COMPLICATIONS  Take your time so you do not get dizzy or light-headed.  If  you are in pain, you may need to take or ask for pain medication before doing incentive spirometry. It is harder to take a deep breath if you are having pain. AFTER USE  Rest and breathe slowly and easily.  It can be helpful to keep track of a log of your progress. Your caregiver can provide you with a simple table to help with this. If you are using the spirometer at home, follow these instructions: [place] IF:   You are having difficultly using the spirometer.  You have trouble using the spirometer as often as instructed.  Your pain medication is not giving enough relief while using the spirometer.  You develop fever of 100.5 F (38.1 C) or higher. SEEK IMMEDIATE MEDICAL CARE IF:   You cough up bloody sputum that had not been present before.  You develop fever of 102 F (38.9 C) or greater.  You develop worsening pain at or near the incision site. MAKE SURE YOU:   Understand these instructions.  Will watch your condition.  Will get help right away if you are not doing well or get worse. Document Released: 09/11/2006 Document Revised: 07/24/2011 Document Reviewed: 11/12/2006 ExitCare Patient Information 2014 ExitCare, [place].   ________________________________________________________________________  WHAT IS A BLOOD TRANSFUSION? Blood Transfusion Information  A transfusion is the replacement of blood or some of its parts. Blood is made up of multiple cells which provide different functions.  Red blood cells carry oxygen and are used for blood loss replacement.  White blood cells fight against infection.  Platelets control bleeding.  Plasma helps clot blood.  Other blood products are available for specialized needs, such as hemophilia or other clotting disorders. BEFORE THE TRANSFUSION  Who gives blood for transfusions?   Healthy volunteers who are fully evaluated to make sure their blood is safe. This is blood bank blood. Transfusion therapy is the  safest it has ever been in the practice of medicine. Before blood is taken from a donor, a complete history is taken to make sure that person has no history of diseases nor engages in risky social behavior (examples are intravenous drug use or sexual activity with multiple partners). The donor's travel history is screened to minimize risk of transmitting infections, such as malaria. The donated blood is tested for signs of infectious diseases, such as HIV and hepatitis. The blood is then tested to be sure it is compatible with you in order to minimize the chance of a transfusion reaction. If you or a relative donates blood, this is often done in anticipation of surgery and is not appropriate for emergency situations. It takes many days to process the donated blood. RISKS AND COMPLICATIONS Although transfusion therapy is very safe and saves many lives, the main dangers of transfusion include:   Getting an infectious disease.  Developing a transfusion reaction. This is an allergic reaction to something in the blood you were given. Every precaution is taken to prevent this. The decision to have a blood transfusion has been considered carefully by your caregiver before blood is given. Blood is not given unless the benefits outweigh the risks. AFTER THE TRANSFUSION  Right after receiving a blood transfusion, you will usually feel much better and more energetic. This is especially true if your red blood cells have gotten low (anemic). The transfusion raises the level of the red blood cells which carry oxygen, and this usually causes an energy increase.  The nurse administering the transfusion will monitor you carefully for complications. HOME CARE INSTRUCTIONS  No special instructions are needed after a transfusion. You may find your energy is better. Speak with your caregiver about any limitations on activity for underlying diseases you may have. SEEK MEDICAL CARE IF:   Your condition is not improving  after your transfusion.  You develop redness or irritation at the intravenous (IV) site. SEEK IMMEDIATE MEDICAL CARE IF:  Any of the following symptoms occur over the next 12 hours:  Shaking chills.  You have a temperature by mouth above 102 F (38.9 C), not controlled by medicine.  Chest, back, or muscle pain.  People around you feel you are not acting correctly or are confused.  Shortness of breath or difficulty breathing.  Dizziness and fainting.  You get a rash or develop hives.  You have a decrease in urine output.  Your urine turns a dark color or changes to pink, red, or brown. Any of the following symptoms occur over the next 10 days:  You have a temperature by mouth above 102 F (38.9 C), not controlled by medicine.  Shortness of breath.  Weakness after normal activity.  The white part of the eye turns yellow (jaundice).  You have a decrease in the amount of urine or are urinating less often.  Your urine turns a dark color or changes to pink, red, or brown. Document Released: 04/28/2000 Document Revised: 07/24/2011 Document Reviewed: 12/16/2007 ExitCare Patient Information 2014 ExitCare, [place].  _______________________________________________________________________   CLEAR LIQUID DIET   Foods Allowed                                                                     Foods Excluded  Coffee and tea, regular and decaf                             liquids that you cannot  Plain Jell-O in any flavor                                             see through such as: Fruit ices (not with fruit pulp)                                     milk, soups, orange juice  Iced Popsicles                                    All solid food Carbonated beverages, regular and diet  Cranberry, grape and apple juices Sports drinks like Gatorade Lightly seasoned clear broth or consume(fat free) Sugar, honey syrup  Sample Menu Breakfast                                 Lunch                                     Supper Cranberry juice                    Beef broth                            Chicken broth Jell-O                                     Grape juice                           Apple juice Coffee or tea                        Jell-O                                      Popsicle                                                Coffee or tea                        Coffee or tea  _____________________________________________________________________

## 2014-08-13 ENCOUNTER — Encounter (HOSPITAL_COMMUNITY)
Admission: RE | Admit: 2014-08-13 | Discharge: 2014-08-13 | Disposition: A | Discharge status: Home / Self Care | Payer: BC Managed Care – PPO | Source: Ambulatory Visit | Attending: Orthopedic Surgery | Admitting: Orthopedic Surgery

## 2014-08-13 ENCOUNTER — Encounter (HOSPITAL_COMMUNITY): Payer: Self-pay

## 2014-08-13 DIAGNOSIS — Z01812 Encounter for preprocedural laboratory examination: Secondary | ICD-10-CM | POA: Insufficient documentation

## 2014-08-13 LAB — CBC
HEMATOCRIT: 39.2 % (ref 36.0–46.0)
Hemoglobin: 12.6 g/dL (ref 12.0–15.0)
MCH: 27.3 pg (ref 26.0–34.0)
MCHC: 32.1 g/dL (ref 30.0–36.0)
MCV: 85 fL (ref 78.0–100.0)
Platelets: 192 10*3/uL (ref 150–400)
RBC: 4.61 MIL/uL (ref 3.87–5.11)
RDW: 17.1 % — ABNORMAL HIGH (ref 11.5–15.5)
WBC: 8.7 10*3/uL (ref 4.0–10.5)

## 2014-08-13 LAB — COMPREHENSIVE METABOLIC PANEL
ALT: 39 U/L — ABNORMAL HIGH (ref 0–35)
ANION GAP: 6 (ref 5–15)
AST: 42 U/L — ABNORMAL HIGH (ref 0–37)
Albumin: 3.3 g/dL — ABNORMAL LOW (ref 3.5–5.2)
Alkaline Phosphatase: 87 U/L (ref 39–117)
BUN: 10 mg/dL (ref 6–23)
CALCIUM: 9.3 mg/dL (ref 8.4–10.5)
CO2: 30 mmol/L (ref 19–32)
CREATININE: 0.71 mg/dL (ref 0.50–1.10)
Chloride: 104 mmol/L (ref 96–112)
GFR calc Af Amer: >90 mL/min (ref >90)
GFR, EST NON AFRICAN AMERICAN: 89 mL/min — AB (ref >90)
GLUCOSE: 88 mg/dL (ref 70–99)
POTASSIUM: 3.9 mmol/L (ref 3.5–5.1)
SODIUM: 140 mmol/L (ref 135–145)
Total Bilirubin: 0.8 mg/dL (ref 0.3–1.2)
Total Protein: 6.2 g/dL (ref 6.0–8.3)

## 2014-08-13 LAB — URINALYSIS, ROUTINE W REFLEX MICROSCOPIC
BILIRUBIN URINE: NEGATIVE
Glucose, UA: NEGATIVE mg/dL
Hgb urine dipstick: NEGATIVE
KETONES UR: NEGATIVE mg/dL
Leukocytes, UA: NEGATIVE
NITRITE: NEGATIVE
Protein, ur: NEGATIVE mg/dL
Specific Gravity, Urine: 1.005 (ref 1.005–1.030)
UROBILINOGEN UA: 0.2 mg/dL (ref 0.0–1.0)
pH: 6.5 (ref 5.0–8.0)

## 2014-08-13 LAB — SURGICAL PCR SCREEN
MRSA, PCR: NEGATIVE
STAPHYLOCOCCUS AUREUS: NEGATIVE

## 2014-08-13 LAB — APTT: APTT: 34 s (ref 24–37)

## 2014-08-13 LAB — PROTIME-INR
INR: 1.1 (ref 0.00–1.49)
Prothrombin Time: 14.3 seconds (ref 11.6–15.2)

## 2014-08-20 ENCOUNTER — Ambulatory Visit: Payer: Self-pay | Admitting: Orthopedic Surgery

## 2014-08-20 MED ORDER — DEXTROSE 5 % IV SOLN
3.0000 g | INTRAVENOUS | Status: DC
  Filled 2014-08-20: qty 3000

## 2014-08-20 NOTE — H&P (Signed)
Brandi Rodgers. Hainsworth 07/23/2014 3:08 PM Location: [address] Patient #: [id number] DOB: 13-Sep-1950 Married / Language: [name] / Race: White Female  History of Present Illness (Deyonna Fitzsimmons L. Briget Shaheed III PA-C; 08/20/2014 6:11 PM) The patient is a 64 year old female who comes in today for a preoperative History and Physical. The patient is scheduled for a right total knee arthroplasty to be performed by Dr. Dione Plover. Aluisio, MD at [facility] on 08-21-2014. The patient comes in several months out from left total knee arthroplasty (and cortisone injection in the right knee). The patient states that she is doing well at this time. The pain is under excellent control at this time and describe their pain as mild to moderate. They are currently on Ultram (1 per day) and Oxycodone (1-2 at night) for their pain. The patient is currently doing outpatient physical therapy. The patient feels that they are progressing well at this time. Unfortunately, the right knee has gotten worse since she started OP PT. They are being followed for their right knee pain and osteoarthritis also. Symptoms reported today include: pain and pain at night. The patient feels that they are doing poorly and report their pain level to be moderate. The following medication has been used for pain control: Oxycodone and Ultram. The patient has reported improvement of their symptoms with: Cortisone injections (but it wore off when she started OP PT). She's really pleased with how her left knee is doing. The right knee is the only thing that is bothering her badly at this time. She has had a cortisone injection about 5 1/2 weeks ago and it wore off very quickly. Right knee is completely holding her back. She is wanting to go ahead and proceed with the right knee replacement. They have been treated conservatively in the past for the above stated problem and despite conservative measures, they continue to have progressive pain and severe  functional limitations and dysfunction. They have failed non-operative management including home exercise, medications, and injections. It is felt that they would benefit from undergoing total joint replacement. Risks and benefits of the procedure have been discussed with the patient and they elect to proceed with surgery. There are no active contraindications to surgery such as ongoing infection or rapidly progressive neurological disease.   Problem List/Past Medical Status post total left knee replacement (O13.086) Primary osteoarthritis of right knee (M17.11) Lumbar facet arthropathy (M46.96) Osteoarthritis of left knee (M17.9) Chronic low back pain (M54.5) Degenerative lumbar disc (M51.36) Essential hypertension (I10)03/06/2014 Paroxysmal atrial fibrillation (I48.0)03/06/2014 Hypercholesterolemia (E78.0)03/06/2014 Migraine headache (G43.909)03/06/2014 Osteoarthritis (M19.90)03/06/2014 Pneumonia Past History Bronchitis Past History Shingles Sleep Apnea- uses  Mouth Guard Urinary Tract Infection Past History Varicose veins Gastroesophageal Reflux Disease Chronic Cystitis Menopause Mumps Measles Skin Cancer Osteoarthritis Hemorrhoids  Allergies Adhesive Bandages - developed small blisters from the steristrips from the last knee surgery Methocarbamol - Rash.  Family History Congestive Heart Failure Mother. Heart Disease Maternal Grandmother, Mother. Chronic Obstructive Lung Disease Father, Mother. Atrial Fibrillation Mother, Maternal Grandmother. Diabetes Mellitus Maternal Grandmother. Osteoarthritis Maternal Grandmother, Mother. Cancer Mother. Hypertension Mother.  Social History Marital status married Current work status retired No history of drug/alcohol rehab Tobacco use Never smoker. 07/31/2013 Tobacco / smoke exposure 07/31/2013: no Number of flights of stairs before winded 2-3 Children 1 Living situation live with  spouse Current drinker 07/31/2013: Currently drinks wine only occasionally per week Not under pain contract Exercise Exercises rarely; does other [place] with husband Advance Directives Living Will  Medication History  Tylenol (325MG  Tablet, 1 (one) Oral) Active. Eliquis (Oral two times daily) Specific dose unknown - Active. Biotin (10MG  Tablet, Oral) Active. Atenolol (50MG  Tablet, Oral) Active. Gabapentin (600MG  Tablet, Oral) Active. Rizatriptan Benzoate (10MG  Tablet Disperse, Oral) Active. Viactiv ([phone] Tablet Chewable, Oral) Active. Multiple Vitamins/Womens (1 (one) Oral) Active.  Past Surgical History Arthroscopy of Knee bilateral Dilation and Curettage of Uterus - Multiple Leg Circulation Surgery bilateral Carpal Tunnel Repair right  Review of Systems General Present- Night Sweats. Not Present- Chills, Fatigue, Fever, Memory Loss, Weight Gain and Weight Loss. Skin Not Present- Eczema, Hives, Itching, Lesions and Rash. HEENT Present- Headache and Tinnitus. Not Present- Dentures, Double Vision, Hearing Loss and Visual Loss. Respiratory Not Present- Allergies, Chronic Cough, Coughing up blood, Shortness of breath at rest and Shortness of breath with exertion. Cardiovascular Present- Swelling. Not Present- Chest Pain, Difficulty Breathing Lying Down, Murmur, Palpitations and Racing/skipping heartbeats. Gastrointestinal Present- Abdominal Pain and Heartburn. Not Present- Bloody Stool, Constipation, Diarrhea, Difficulty Swallowing, Jaundice, Loss of appetitie, Nausea and Vomiting. Female Genitourinary Present- Urinary frequency and Urinating at Night. Not Present- Blood in Urine, Discharge, Flank Pain, Incontinence, Painful Urination, Urgency, Urinary Retention and Weak urinary stream. Musculoskeletal Present- Back Pain, Joint Pain, Joint Swelling, Morning Stiffness and Muscle Weakness. Not Present- Muscle Pain and Spasms. Neurological Not  Present- Blackout spells, Difficulty with balance, Dizziness, Paralysis, Tremor and Weakness. Psychiatric Not Present- Insomnia.   Vitals Height: 68in BP: 122/68 (Sitting, Right Arm, Standard)  Physical Exam General Mental Status -Alert, cooperative and good historian. General Appearance-pleasant, Not in acute distress. Orientation-Oriented X3. Build & Nutrition-Well nourished and Well developed.  Head and Neck Head-normocephalic, atraumatic . Neck Global Assessment - supple, no bruit auscultated on the right, no bruit auscultated on the left.  Eye Vision-Wears contact lenses. Pupil - Bilateral-Regular and Round. Motion - Bilateral-EOMI.  Chest and Lung Exam Auscultation Breath sounds - clear at anterior chest wall and clear at posterior chest wall. Adventitious sounds - No Adventitious sounds.  Cardiovascular Auscultation Rhythm - Regular and Bradycardic. Heart Sounds - S1 WNL and S2 WNL. Murmurs & Other Heart Sounds - Auscultation of the heart reveals - No Murmurs.  Abdomen Inspection Contour - Generalized moderate distention. Palpation/Percussion Tenderness - Abdomen is non-tender to palpation. Rigidity (guarding) - Abdomen is soft. Auscultation Auscultation of the abdomen reveals - Bowel sounds normal.  Female Genitourinary Note: Not done, not pertinent to present illness   Musculoskeletal Note: On exam, she is a well-developed female who is alert and oriented and in no apparent distress. The left knee looks fantastic. There is minimal if any swelling. Range is 0 to 120 degrees with no tenderness or instability. Right knee range is about 5 to 115 degrees with marked crepitus on range of motion and tenderness medial greater than lateral with no instability noted.  Assessment & Plan Primary osteoarthritis of one knee, right (M17.11) Note:Surgical Plans: Right Total Knee Repalcement  Disposition: Home  PCP: Dr. Jonathon Jordan Cardiology: Dr. Mare Ferrari - Patient has been seen preoperatively and felt to be stable for surgery. Seh was instructed to stop her Eliquis 2 days prior to surgery.  IV TXA  Anesthesia Issues: None  Signed electronically by Joelene Millin, III PA-C

## 2014-08-21 ENCOUNTER — Encounter (HOSPITAL_COMMUNITY): Payer: Self-pay | Admitting: *Deleted

## 2014-08-21 ENCOUNTER — Inpatient Hospital Stay (HOSPITAL_COMMUNITY): Payer: BC Managed Care – PPO | Admitting: Anesthesiology

## 2014-08-21 ENCOUNTER — Encounter (HOSPITAL_COMMUNITY)
Admission: RE | Disposition: A | Discharge status: Home Health | Payer: Self-pay | Source: Ambulatory Visit | Attending: Orthopedic Surgery

## 2014-08-21 ENCOUNTER — Inpatient Hospital Stay (HOSPITAL_COMMUNITY)
Admission: RE | Admit: 2014-08-21 | Discharge: 2014-08-23 | DRG: 470 | Disposition: A | Discharge status: Home Health | Payer: BC Managed Care – PPO | Source: Ambulatory Visit | Attending: Orthopedic Surgery | Admitting: Orthopedic Surgery

## 2014-08-21 DIAGNOSIS — K219 Gastro-esophageal reflux disease without esophagitis: Secondary | ICD-10-CM | POA: Diagnosis present

## 2014-08-21 DIAGNOSIS — Z833 Family history of diabetes mellitus: Secondary | ICD-10-CM | POA: Diagnosis not present

## 2014-08-21 DIAGNOSIS — Z8249 Family history of ischemic heart disease and other diseases of the circulatory system: Secondary | ICD-10-CM

## 2014-08-21 DIAGNOSIS — G4730 Sleep apnea, unspecified: Secondary | ICD-10-CM | POA: Diagnosis present

## 2014-08-21 DIAGNOSIS — E780 Pure hypercholesterolemia: Secondary | ICD-10-CM | POA: Diagnosis present

## 2014-08-21 DIAGNOSIS — G8929 Other chronic pain: Secondary | ICD-10-CM | POA: Diagnosis present

## 2014-08-21 DIAGNOSIS — Z85828 Personal history of other malignant neoplasm of skin: Secondary | ICD-10-CM

## 2014-08-21 DIAGNOSIS — Z825 Family history of asthma and other chronic lower respiratory diseases: Secondary | ICD-10-CM | POA: Diagnosis not present

## 2014-08-21 DIAGNOSIS — Z96652 Presence of left artificial knee joint: Secondary | ICD-10-CM | POA: Diagnosis present

## 2014-08-21 DIAGNOSIS — M1711 Unilateral primary osteoarthritis, right knee: Principal | ICD-10-CM | POA: Diagnosis present

## 2014-08-21 DIAGNOSIS — I10 Essential (primary) hypertension: Secondary | ICD-10-CM | POA: Diagnosis present

## 2014-08-21 DIAGNOSIS — I480 Paroxysmal atrial fibrillation: Secondary | ICD-10-CM | POA: Diagnosis present

## 2014-08-21 DIAGNOSIS — M25561 Pain in right knee: Secondary | ICD-10-CM | POA: Diagnosis present

## 2014-08-21 DIAGNOSIS — M1710 Unilateral primary osteoarthritis, unspecified knee: Secondary | ICD-10-CM | POA: Diagnosis present

## 2014-08-21 DIAGNOSIS — M179 Osteoarthritis of knee, unspecified: Secondary | ICD-10-CM | POA: Diagnosis present

## 2014-08-21 DIAGNOSIS — Z809 Family history of malignant neoplasm, unspecified: Secondary | ICD-10-CM | POA: Diagnosis not present

## 2014-08-21 HISTORY — PX: TOTAL KNEE ARTHROPLASTY: SHX125

## 2014-08-21 LAB — TYPE AND SCREEN
ABO/RH(D): O POS
Antibody Screen: NEGATIVE

## 2014-08-21 SURGERY — ARTHROPLASTY, KNEE, TOTAL
Anesthesia: General | Site: Knee | Laterality: Right

## 2014-08-21 MED ORDER — CYCLOBENZAPRINE HCL 5 MG PO TABS
5.0000 mg | ORAL_TABLET | Freq: Three times a day (TID) | ORAL | Status: DC | PRN
  Administered 2014-08-22 (×3): 5 mg via ORAL
  Filled 2014-08-21 (×3): qty 1

## 2014-08-21 MED ORDER — DIPHENHYDRAMINE HCL 12.5 MG/5ML PO ELIX: 12.5000 mg | ORAL_SOLUTION | ORAL | Status: DC | PRN

## 2014-08-21 MED ORDER — LACTATED RINGERS IV SOLN
INTRAVENOUS | Status: DC
  Administered 2014-08-21: 16:00:00 via INTRAVENOUS
  Administered 2014-08-21: 1000 mL via INTRAVENOUS

## 2014-08-21 MED ORDER — POLYETHYLENE GLYCOL 3350 17 G PO PACK: 17.0000 g | PACK | Freq: Every day | ORAL | Status: DC | PRN

## 2014-08-21 MED ORDER — ONDANSETRON HCL 4 MG PO TABS
4.0000 mg | ORAL_TABLET | Freq: Four times a day (QID) | ORAL | Status: DC | PRN
  Filled 2014-08-21: qty 1

## 2014-08-21 MED ORDER — SODIUM CHLORIDE 0.9 % IJ SOLN
INTRAMUSCULAR | Status: DC | PRN
  Administered 2014-08-21: 30 mL

## 2014-08-21 MED ORDER — TRANEXAMIC ACID 100 MG/ML IV SOLN
1000.0000 mg | INTRAVENOUS | Status: AC
  Administered 2014-08-21: 1000 mg via INTRAVENOUS
  Filled 2014-08-21: qty 10

## 2014-08-21 MED ORDER — LIP MEDEX EX OINT
TOPICAL_OINTMENT | CUTANEOUS | Status: AC
  Administered 2014-08-21: 21:00:00
  Filled 2014-08-21: qty 7

## 2014-08-21 MED ORDER — SUCCINYLCHOLINE CHLORIDE 20 MG/ML IJ SOLN
INTRAMUSCULAR | Status: DC | PRN
  Administered 2014-08-21: 100 mg via INTRAVENOUS

## 2014-08-21 MED ORDER — BUPIVACAINE-EPINEPHRINE (PF) 0.25% -1:200000 IJ SOLN
INTRAMUSCULAR | Status: AC
  Filled 2014-08-21: qty 30

## 2014-08-21 MED ORDER — DEXAMETHASONE SODIUM PHOSPHATE 10 MG/ML IJ SOLN
10.0000 mg | Freq: Once | INTRAMUSCULAR | Status: AC
  Administered 2014-08-21: 10 mg via INTRAVENOUS

## 2014-08-21 MED ORDER — HYDROMORPHONE HCL 1 MG/ML IJ SOLN
0.2500 mg | INTRAMUSCULAR | Status: DC | PRN
  Administered 2014-08-21 (×2): 0.25 mg via INTRAVENOUS
  Administered 2014-08-21: 0.5 mg via INTRAVENOUS

## 2014-08-21 MED ORDER — DEXAMETHASONE SODIUM PHOSPHATE 10 MG/ML IJ SOLN
INTRAMUSCULAR | Status: AC
  Filled 2014-08-21: qty 1

## 2014-08-21 MED ORDER — CHLORHEXIDINE GLUCONATE 4 % EX LIQD: 60.0000 mL | Freq: Once | CUTANEOUS | Status: DC

## 2014-08-21 MED ORDER — SUMATRIPTAN SUCCINATE 100 MG PO TABS
100.0000 mg | ORAL_TABLET | ORAL | Status: DC | PRN
  Filled 2014-08-21: qty 1

## 2014-08-21 MED ORDER — HYDROMORPHONE HCL 1 MG/ML IJ SOLN
INTRAMUSCULAR | Status: DC | PRN
  Administered 2014-08-21 (×2): 1 mg via INTRAVENOUS

## 2014-08-21 MED ORDER — ACETAMINOPHEN 500 MG PO TABS
1000.0000 mg | ORAL_TABLET | Freq: Four times a day (QID) | ORAL | Status: DC
  Administered 2014-08-22 – 2014-08-23 (×3): 1000 mg via ORAL
  Filled 2014-08-21 (×4): qty 2

## 2014-08-21 MED ORDER — HYDROMORPHONE HCL 1 MG/ML IJ SOLN
INTRAMUSCULAR | Status: AC
  Filled 2014-08-21: qty 1

## 2014-08-21 MED ORDER — BUPIVACAINE LIPOSOME 1.3 % IJ SUSP
INTRAMUSCULAR | Status: DC | PRN
  Administered 2014-08-21: 20 mL

## 2014-08-21 MED ORDER — PROPOFOL 10 MG/ML IV BOLUS
INTRAVENOUS | Status: AC
  Filled 2014-08-21: qty 20

## 2014-08-21 MED ORDER — PROPOFOL 10 MG/ML IV BOLUS
INTRAVENOUS | Status: DC | PRN
  Administered 2014-08-21: 150 mg via INTRAVENOUS

## 2014-08-21 MED ORDER — BISACODYL 10 MG RE SUPP: 10.0000 mg | Freq: Every day | RECTAL | Status: DC | PRN

## 2014-08-21 MED ORDER — ACETAMINOPHEN 10 MG/ML IV SOLN
1000.0000 mg | Freq: Once | INTRAVENOUS | Status: AC
  Administered 2014-08-21: 1000 mg via INTRAVENOUS
  Filled 2014-08-21: qty 100

## 2014-08-21 MED ORDER — CEFAZOLIN SODIUM-DEXTROSE 2-3 GM-% IV SOLR
INTRAVENOUS | Status: AC
  Filled 2014-08-21: qty 50

## 2014-08-21 MED ORDER — BUPIVACAINE HCL 0.25 % IJ SOLN
INTRAMUSCULAR | Status: DC | PRN
  Administered 2014-08-21: 30 mL

## 2014-08-21 MED ORDER — FENTANYL CITRATE 0.05 MG/ML IJ SOLN
INTRAMUSCULAR | Status: AC
Start: 2014-08-21 — End: 2014-08-21
  Filled 2014-08-21: qty 2

## 2014-08-21 MED ORDER — FAMOTIDINE 20 MG PO TABS
20.0000 mg | ORAL_TABLET | Freq: Two times a day (BID) | ORAL | Status: DC
  Administered 2014-08-22 – 2014-08-23 (×3): 20 mg via ORAL
  Filled 2014-08-21 (×5): qty 1

## 2014-08-21 MED ORDER — HYDROMORPHONE HCL 2 MG/ML IJ SOLN
INTRAMUSCULAR | Status: AC
  Filled 2014-08-21: qty 1

## 2014-08-21 MED ORDER — PHENOL 1.4 % MT LIQD: 1.0000 | OROMUCOSAL | Status: DC | PRN

## 2014-08-21 MED ORDER — RIZATRIPTAN BENZOATE 10 MG PO TBDP: 10.0000 mg | ORAL_TABLET | ORAL | Status: DC | PRN

## 2014-08-21 MED ORDER — FENTANYL CITRATE 0.05 MG/ML IJ SOLN
INTRAMUSCULAR | Status: DC | PRN
  Administered 2014-08-21: 100 ug via INTRAVENOUS
  Administered 2014-08-21 (×3): 50 ug via INTRAVENOUS

## 2014-08-21 MED ORDER — CEFAZOLIN SODIUM 1-5 GM-% IV SOLN
1.0000 g | Freq: Once | INTRAVENOUS | Status: AC
  Administered 2014-08-21: 1 g via INTRAVENOUS
  Filled 2014-08-21: qty 50

## 2014-08-21 MED ORDER — FUROSEMIDE 20 MG PO TABS
20.0000 mg | ORAL_TABLET | Freq: Every day | ORAL | Status: DC | PRN
  Filled 2014-08-21: qty 1

## 2014-08-21 MED ORDER — KCL IN DEXTROSE-NACL 20-5-0.9 MEQ/L-%-% IV SOLN
INTRAVENOUS | Status: DC
  Administered 2014-08-21: 75 mL/h via INTRAVENOUS
  Filled 2014-08-21 (×2): qty 1000

## 2014-08-21 MED ORDER — MIDAZOLAM HCL 2 MG/2ML IJ SOLN
INTRAMUSCULAR | Status: AC
  Filled 2014-08-21: qty 2

## 2014-08-21 MED ORDER — DEXAMETHASONE SODIUM PHOSPHATE 10 MG/ML IJ SOLN
10.0000 mg | Freq: Once | INTRAMUSCULAR | Status: AC
  Administered 2014-08-22: 10 mg via INTRAVENOUS
  Filled 2014-08-21: qty 1

## 2014-08-21 MED ORDER — CEFAZOLIN SODIUM-DEXTROSE 2-3 GM-% IV SOLR
2.0000 g | Freq: Once | INTRAVENOUS | Status: AC
  Administered 2014-08-21: 2 g via INTRAVENOUS

## 2014-08-21 MED ORDER — METOCLOPRAMIDE HCL 5 MG/ML IJ SOLN: 5.0000 mg | Freq: Three times a day (TID) | INTRAMUSCULAR | Status: DC | PRN

## 2014-08-21 MED ORDER — ATENOLOL 25 MG PO TABS
25.0000 mg | ORAL_TABLET | Freq: Every morning | ORAL | Status: DC
  Administered 2014-08-22 – 2014-08-23 (×2): 25 mg via ORAL
  Filled 2014-08-21 (×2): qty 1

## 2014-08-21 MED ORDER — EPHEDRINE SULFATE 50 MG/ML IJ SOLN
INTRAMUSCULAR | Status: DC | PRN
  Administered 2014-08-21: 5 mg via INTRAVENOUS

## 2014-08-21 MED ORDER — SODIUM CHLORIDE 0.9 % IV SOLN: INTRAVENOUS | Status: DC

## 2014-08-21 MED ORDER — FLEET ENEMA 7-19 GM/118ML RE ENEM: 1.0000 | ENEMA | Freq: Once | RECTAL | Status: AC | PRN

## 2014-08-21 MED ORDER — GABAPENTIN 300 MG PO CAPS
600.0000 mg | ORAL_CAPSULE | Freq: Every morning | ORAL | Status: DC
  Administered 2014-08-22 – 2014-08-23 (×2): 600 mg via ORAL
  Filled 2014-08-21 (×2): qty 2

## 2014-08-21 MED ORDER — MENTHOL 3 MG MT LOZG: 1.0000 | LOZENGE | OROMUCOSAL | Status: DC | PRN

## 2014-08-21 MED ORDER — ACETAMINOPHEN 650 MG RE SUPP: 650.0000 mg | Freq: Four times a day (QID) | RECTAL | Status: DC | PRN

## 2014-08-21 MED ORDER — DOCUSATE SODIUM 100 MG PO CAPS
100.0000 mg | ORAL_CAPSULE | Freq: Two times a day (BID) | ORAL | Status: DC
  Administered 2014-08-21 – 2014-08-23 (×4): 100 mg via ORAL

## 2014-08-21 MED ORDER — TRAMADOL HCL 50 MG PO TABS
50.0000 mg | ORAL_TABLET | Freq: Four times a day (QID) | ORAL | Status: DC | PRN
  Administered 2014-08-22: 50 mg via ORAL
  Administered 2014-08-22 – 2014-08-23 (×2): 100 mg via ORAL
  Filled 2014-08-21 (×2): qty 2
  Filled 2014-08-21: qty 1

## 2014-08-21 MED ORDER — METOCLOPRAMIDE HCL 10 MG PO TABS: 5.0000 mg | ORAL_TABLET | Freq: Three times a day (TID) | ORAL | Status: DC | PRN

## 2014-08-21 MED ORDER — OXYCODONE HCL 5 MG PO TABS
5.0000 mg | ORAL_TABLET | ORAL | Status: DC | PRN
  Administered 2014-08-22: 10 mg via ORAL
  Filled 2014-08-21: qty 2

## 2014-08-21 MED ORDER — CEFAZOLIN SODIUM-DEXTROSE 2-3 GM-% IV SOLR
2.0000 g | Freq: Four times a day (QID) | INTRAVENOUS | Status: AC
  Administered 2014-08-21 – 2014-08-22 (×2): 2 g via INTRAVENOUS
  Filled 2014-08-21 (×2): qty 50

## 2014-08-21 MED ORDER — ACETAMINOPHEN 325 MG PO TABS
650.0000 mg | ORAL_TABLET | Freq: Four times a day (QID) | ORAL | Status: DC | PRN
  Administered 2014-08-22 (×2): 650 mg via ORAL
  Filled 2014-08-21 (×2): qty 2

## 2014-08-21 MED ORDER — FENTANYL CITRATE 0.05 MG/ML IJ SOLN
INTRAMUSCULAR | Status: AC
  Filled 2014-08-21: qty 5

## 2014-08-21 MED ORDER — ONDANSETRON HCL 4 MG/2ML IJ SOLN
INTRAMUSCULAR | Status: DC | PRN
  Administered 2014-08-21: 4 mg via INTRAVENOUS

## 2014-08-21 MED ORDER — GABAPENTIN 400 MG PO CAPS
1200.0000 mg | ORAL_CAPSULE | Freq: Every day | ORAL | Status: DC
  Administered 2014-08-21 – 2014-08-22 (×2): 1200 mg via ORAL
  Filled 2014-08-21 (×3): qty 3

## 2014-08-21 MED ORDER — ONDANSETRON HCL 4 MG/2ML IJ SOLN
INTRAMUSCULAR | Status: AC
Start: 2014-08-21 — End: 2014-08-21
  Filled 2014-08-21: qty 2

## 2014-08-21 MED ORDER — MORPHINE SULFATE 2 MG/ML IJ SOLN: 1.0000 mg | INTRAMUSCULAR | Status: DC | PRN

## 2014-08-21 MED ORDER — CEFAZOLIN SODIUM 1-5 GM-% IV SOLN: 1.0000 g | Freq: Three times a day (TID) | INTRAVENOUS | Status: DC

## 2014-08-21 MED ORDER — MIDAZOLAM HCL 5 MG/5ML IJ SOLN
INTRAMUSCULAR | Status: DC | PRN
  Administered 2014-08-21: 2 mg via INTRAVENOUS

## 2014-08-21 MED ORDER — ONDANSETRON HCL 4 MG/2ML IJ SOLN: 4.0000 mg | Freq: Four times a day (QID) | INTRAMUSCULAR | Status: DC | PRN

## 2014-08-21 MED ORDER — BUPIVACAINE LIPOSOME 1.3 % IJ SUSP
20.0000 mL | Freq: Once | INTRAMUSCULAR | Status: DC
  Filled 2014-08-21: qty 20

## 2014-08-21 MED ORDER — APIXABAN 5 MG PO TABS
5.0000 mg | ORAL_TABLET | Freq: Two times a day (BID) | ORAL | Status: DC
  Administered 2014-08-22 – 2014-08-23 (×3): 5 mg via ORAL
  Filled 2014-08-21 (×4): qty 1

## 2014-08-21 MED ORDER — SODIUM CHLORIDE 0.9 % IJ SOLN
INTRAMUSCULAR | Status: AC
  Filled 2014-08-21: qty 50

## 2014-08-21 SURGICAL SUPPLY — 65 items
BAG DECANTER FOR FLEXI CONT (MISCELLANEOUS) ×2 IMPLANT
BAG ZIPLOCK 12X15 (MISCELLANEOUS) ×2 IMPLANT
BANDAGE ELASTIC 6 VELCRO ST LF (GAUZE/BANDAGES/DRESSINGS) ×2 IMPLANT
BANDAGE ESMARK 6X9 LF (GAUZE/BANDAGES/DRESSINGS) ×1 IMPLANT
BLADE SAG 18X100X1.27 (BLADE) ×2 IMPLANT
BLADE SAW SGTL 11.0X1.19X90.0M (BLADE) ×2 IMPLANT
BNDG ESMARK 6X9 LF (GAUZE/BANDAGES/DRESSINGS) ×2
BOWL SMART MIX CTS (DISPOSABLE) ×2 IMPLANT
CAPT KNEE TOTAL 3 ATTUNE ×2 IMPLANT
CEMENT HV SMART SET (Cement) ×4 IMPLANT
CUFF TOURN SGL QUICK 34 (TOURNIQUET CUFF) ×1
CUFF TRNQT CYL 34X4X40X1 (TOURNIQUET CUFF) ×1 IMPLANT
DECANTER SPIKE VIAL GLASS SM (MISCELLANEOUS) ×2 IMPLANT
DRAPE EXTREMITY T 121X128X90 (DRAPE) ×2 IMPLANT
DRAPE POUCH INSTRU U-SHP 10X18 (DRAPES) ×2 IMPLANT
DRAPE U-SHAPE 47X51 STRL (DRAPES) ×2 IMPLANT
DRSG ADAPTIC 3X8 NADH LF (GAUZE/BANDAGES/DRESSINGS) ×2 IMPLANT
DRSG AQUACEL AG ADV 3.5X10 (GAUZE/BANDAGES/DRESSINGS) ×2 IMPLANT
DRSG PAD ABDOMINAL 8X10 ST (GAUZE/BANDAGES/DRESSINGS) ×2 IMPLANT
DRSG TEGADERM 4X4.75 (GAUZE/BANDAGES/DRESSINGS) ×2 IMPLANT
DURAPREP 26ML APPLICATOR (WOUND CARE) ×2 IMPLANT
ELECT REM PT RETURN 9FT ADLT (ELECTROSURGICAL) ×2
ELECTRODE REM PT RTRN 9FT ADLT (ELECTROSURGICAL) ×1 IMPLANT
EVACUATOR 1/8 PVC DRAIN (DRAIN) ×2 IMPLANT
FACESHIELD WRAPAROUND (MASK) ×10 IMPLANT
GAUZE SPONGE 2X2 8PLY STRL LF (GAUZE/BANDAGES/DRESSINGS) ×1 IMPLANT
GAUZE SPONGE 4X4 12PLY STRL (GAUZE/BANDAGES/DRESSINGS) ×2 IMPLANT
GLOVE BIO SURGEON STRL SZ7.5 (GLOVE) ×2 IMPLANT
GLOVE BIO SURGEON STRL SZ8 (GLOVE) ×2 IMPLANT
GLOVE BIOGEL PI IND STRL 6.5 (GLOVE) IMPLANT
GLOVE BIOGEL PI IND STRL 8 (GLOVE) ×2 IMPLANT
GLOVE BIOGEL PI INDICATOR 6.5 (GLOVE)
GLOVE BIOGEL PI INDICATOR 8 (GLOVE) ×2
GLOVE SURG SS PI 6.5 STRL IVOR (GLOVE) IMPLANT
GOWN STRL REUS W/TWL LRG LVL3 (GOWN DISPOSABLE) ×2 IMPLANT
GOWN STRL REUS W/TWL XL LVL3 (GOWN DISPOSABLE) IMPLANT
HANDPIECE INTERPULSE COAX TIP (DISPOSABLE) ×1
IMMOBILIZER KNEE 20 (SOFTGOODS) ×2
IMMOBILIZER KNEE 20 THIGH 36 (SOFTGOODS) ×1 IMPLANT
KIT BASIN OR (CUSTOM PROCEDURE TRAY) ×2 IMPLANT
LIQUID BAND (GAUZE/BANDAGES/DRESSINGS) ×2 IMPLANT
MANIFOLD NEPTUNE II (INSTRUMENTS) ×2 IMPLANT
NDL SAFETY ECLIPSE 18X1.5 (NEEDLE) ×2 IMPLANT
NEEDLE HYPO 18GX1.5 SHARP (NEEDLE) ×2
NS IRRIG 1000ML POUR BTL (IV SOLUTION) ×2 IMPLANT
PACK TOTAL JOINT (CUSTOM PROCEDURE TRAY) ×2 IMPLANT
PADDING CAST COTTON 6X4 STRL (CAST SUPPLIES) ×6 IMPLANT
PEN SKIN MARKING BROAD (MISCELLANEOUS) ×2 IMPLANT
POSITIONER SURGICAL ARM (MISCELLANEOUS) ×2 IMPLANT
SET HNDPC FAN SPRY TIP SCT (DISPOSABLE) ×1 IMPLANT
SPONGE GAUZE 2X2 STER 10/PKG (GAUZE/BANDAGES/DRESSINGS) ×1
STRIP CLOSURE SKIN 1/2X4 (GAUZE/BANDAGES/DRESSINGS) ×4 IMPLANT
SUCTION FRAZIER 12FR DISP (SUCTIONS) ×2 IMPLANT
SUT MNCRL AB 4-0 PS2 18 (SUTURE) ×2 IMPLANT
SUT VIC AB 2-0 CT1 27 (SUTURE) ×4
SUT VIC AB 2-0 CT1 TAPERPNT 27 (SUTURE) ×4 IMPLANT
SUT VLOC 180 0 24IN GS25 (SUTURE) ×2 IMPLANT
SYR 20CC LL (SYRINGE) ×2 IMPLANT
SYR 50ML LL SCALE MARK (SYRINGE) ×2 IMPLANT
TOWEL OR 17X26 10 PK STRL BLUE (TOWEL DISPOSABLE) ×2 IMPLANT
TOWEL OR NON WOVEN STRL DISP B (DISPOSABLE) IMPLANT
TRAY FOLEY CATH 14FRSI W/METER (CATHETERS) ×2 IMPLANT
WATER STERILE IRR 1500ML POUR (IV SOLUTION) ×2 IMPLANT
WRAP KNEE MAXI GEL POST OP (GAUZE/BANDAGES/DRESSINGS) ×2 IMPLANT
YANKAUER SUCT BULB TIP 10FT TU (MISCELLANEOUS) ×2 IMPLANT

## 2014-08-21 NOTE — Anesthesia Postprocedure Evaluation (Signed)
  Anesthesia Post-op Note  Patient: Brandi Rodgers  Procedure(s) Performed: Procedure(s): RIGHT TOTAL KNEE ARTHROPLASTY (Right)  Patient Location: PACU  Anesthesia Type:General  Level of Consciousness: awake and alert   Airway and Oxygen Therapy: Patient Spontanous Breathing  Post-op Pain: mild  Post-op Assessment: Post-op Vital signs reviewed, Patient's Cardiovascular Status Stable and Respiratory Function Stable  Post-op Vital Signs: Reviewed  Filed Vitals:   08/21/14 1745  BP: 158/78  Pulse: 85  Temp: 36.5 C  Resp: 13    Complications: No apparent anesthesia complications

## 2014-08-21 NOTE — Anesthesia Preprocedure Evaluation (Addendum)
Anesthesia Evaluation  Patient identified by MRN, date of birth, ID band Patient awake    Reviewed: Allergy & Precautions, H&P , NPO status , Patient's Chart, lab work & pertinent test results, reviewed documented beta blocker date and time   Airway Mallampati: II  TM Distance: >3 FB Neck ROM: Full    Dental no notable dental hx. (+) Teeth Intact, Dental Advisory Given   Pulmonary sleep apnea ,  breath sounds clear to auscultation  Pulmonary exam normal       Cardiovascular hypertension, Pt. on medications and Pt. on home beta blockers + dysrhythmias Atrial Fibrillation Rhythm:Regular Rate:Normal     Neuro/Psych  Headaches, negative psych ROS   GI/Hepatic Neg liver ROS, GERD-  Medicated and Controlled,  Endo/Other  Morbid obesity  Renal/GU negative Renal ROS  negative genitourinary   Musculoskeletal  (+) Arthritis -, Osteoarthritis,    Abdominal   Peds  Hematology negative hematology ROS (+)   Anesthesia Other Findings   Reproductive/Obstetrics negative OB ROS                            Anesthesia Physical Anesthesia Plan  ASA: III  Anesthesia Plan: General   Post-op Pain Management:    Induction: Intravenous  Airway Management Planned: LMA  Additional Equipment:   Intra-op Plan:   Post-operative Plan: Extubation in OR  Informed Consent: I have reviewed the patients History and Physical, chart, labs and discussed the procedure including the risks, benefits and alternatives for the proposed anesthesia with the patient or authorized representative who has indicated his/her understanding and acceptance.   Dental advisory given  Plan Discussed with: CRNA  Anesthesia Plan Comments: (Pt had GA for previous knee replacement. Would like to do the same for this knee replacement.)      Anesthesia Quick Evaluation

## 2014-08-21 NOTE — H&P (View-Only) (Signed)
Brandi Rodgers 07/23/2014 3:08 PM Location: [facility] Patient #: [id number] DOB: 04/07/1951 Married / Language: [name] / Race: White Female  History of Present Illness (Laekyn Rayos L. Wylma Tatem III PA-C; 08/20/2014 6:11 PM) The patient is a 64 year old female who comes in today for a preoperative History and Physical. The patient is scheduled for a right total knee arthroplasty to be performed by Dr. Dione Plover. Aluisio, MD at [facility] on 08-21-2014. The patient comes in several months out from left total knee arthroplasty (and cortisone injection in the right knee). The patient states that she is doing well at this time. The pain is under excellent control at this time and describe their pain as mild to moderate. They are currently on Ultram (1 per day) and Oxycodone (1-2 at night) for their pain. The patient is currently doing outpatient physical therapy. The patient feels that they are progressing well at this time. Unfortunately, the right knee has gotten worse since she started OP PT. They are being followed for their right knee pain and osteoarthritis also. Symptoms reported today include: pain and pain at night. The patient feels that they are doing poorly and report their pain level to be moderate. The following medication has been used for pain control: Oxycodone and Ultram. The patient has reported improvement of their symptoms with: Cortisone injections (but it wore off when she started OP PT). She's really pleased with how her left knee is doing. The right knee is the only thing that is bothering her badly at this time. She has had a cortisone injection about 5 1/2 weeks ago and it wore off very quickly. Right knee is completely holding her back. She is wanting to go ahead and proceed with the right knee replacement. They have been treated conservatively in the past for the above stated problem and despite conservative measures, they continue to have progressive pain and severe  functional limitations and dysfunction. They have failed non-operative management including home exercise, medications, and injections. It is felt that they would benefit from undergoing total joint replacement. Risks and benefits of the procedure have been discussed with the patient and they elect to proceed with surgery. There are no active contraindications to surgery such as ongoing infection or rapidly progressive neurological disease.   Problem List/Past Medical Status post total left knee replacement (Y60.630) Primary osteoarthritis of right knee (M17.11) Lumbar facet arthropathy (M46.96) Osteoarthritis of left knee (M17.9) Chronic low back pain (M54.5) Degenerative lumbar disc (M51.36) Essential hypertension (I10)03/06/2014 Paroxysmal atrial fibrillation (I48.0)03/06/2014 Hypercholesterolemia (E78.0)03/06/2014 Migraine headache (G43.909)03/06/2014 Osteoarthritis (M19.90)03/06/2014 Pneumonia Past History Bronchitis Past History Shingles Sleep Apnea- uses  Mouth Guard Urinary Tract Infection Past History Varicose veins Gastroesophageal Reflux Disease Chronic Cystitis Menopause Mumps Measles Skin Cancer Osteoarthritis Hemorrhoids  Allergies Adhesive Bandages - developed small blisters from the steristrips from the last knee surgery Methocarbamol - Rash.  Family History Congestive Heart Failure Mother. Heart Disease Maternal Grandmother, Mother. Chronic Obstructive Lung Disease Father, Mother. Atrial Fibrillation Mother, Maternal Grandmother. Diabetes Mellitus Maternal Grandmother. Osteoarthritis Maternal Grandmother, Mother. Cancer Mother. Hypertension Mother.  Social History Marital status married Current work status retired No history of drug/alcohol rehab Tobacco use Never smoker. 07/31/2013 Tobacco / smoke exposure 07/31/2013: no Number of flights of stairs before winded 2-3 Children 1 Living situation live with  spouse Current drinker 07/31/2013: Currently drinks wine only occasionally per week Not under pain contract Exercise Exercises rarely; does other [place] with husband Advance Directives Living Will  Medication History  Tylenol (325MG  Tablet, 1 (one) Oral) Active. Eliquis (Oral two times daily) Specific dose unknown - Active. Biotin (10MG  Tablet, Oral) Active. Atenolol (50MG  Tablet, Oral) Active. Gabapentin (600MG  Tablet, Oral) Active. Rizatriptan Benzoate (10MG  Tablet Disperse, Oral) Active. Viactiv ([phone] Tablet Chewable, Oral) Active. Multiple Vitamins/Womens (1 (one) Oral) Active.  Past Surgical History Arthroscopy of Knee bilateral Dilation and Curettage of Uterus - Multiple Leg Circulation Surgery bilateral Carpal Tunnel Repair right  Review of Systems General Present- Night Sweats. Not Present- Chills, Fatigue, Fever, Memory Loss, Weight Gain and Weight Loss. Skin Not Present- Eczema, Hives, Itching, Lesions and Rash. HEENT Present- Headache and Tinnitus. Not Present- Dentures, Double Vision, Hearing Loss and Visual Loss. Respiratory Not Present- Allergies, Chronic Cough, Coughing up blood, Shortness of breath at rest and Shortness of breath with exertion. Cardiovascular Present- Swelling. Not Present- Chest Pain, Difficulty Breathing Lying Down, Murmur, Palpitations and Racing/skipping heartbeats. Gastrointestinal Present- Abdominal Pain and Heartburn. Not Present- Bloody Stool, Constipation, Diarrhea, Difficulty Swallowing, Jaundice, Loss of appetitie, Nausea and Vomiting. Female Genitourinary Present- Urinary frequency and Urinating at Night. Not Present- Blood in Urine, Discharge, Flank Pain, Incontinence, Painful Urination, Urgency, Urinary Retention and Weak urinary stream. Musculoskeletal Present- Back Pain, Joint Pain, Joint Swelling, Morning Stiffness and Muscle Weakness. Not Present- Muscle Pain and Spasms. Neurological Not  Present- Blackout spells, Difficulty with balance, Dizziness, Paralysis, Tremor and Weakness. Psychiatric Not Present- Insomnia.   Vitals Height: 68in BP: 122/68 (Sitting, Right Arm, Standard)  Physical Exam General Mental Status -Alert, cooperative and good historian. General Appearance-pleasant, Not in acute distress. Orientation-Oriented X3. Build & Nutrition-Well nourished and Well developed.  Head and Neck Head-normocephalic, atraumatic . Neck Global Assessment - supple, no bruit auscultated on the right, no bruit auscultated on the left.  Eye Vision-Wears contact lenses. Pupil - Bilateral-Regular and Round. Motion - Bilateral-EOMI.  Chest and Lung Exam Auscultation Breath sounds - clear at anterior chest wall and clear at posterior chest wall. Adventitious sounds - No Adventitious sounds.  Cardiovascular Auscultation Rhythm - Regular and Bradycardic. Heart Sounds - S1 WNL and S2 WNL. Murmurs & Other Heart Sounds - Auscultation of the heart reveals - No Murmurs.  Abdomen Inspection Contour - Generalized moderate distention. Palpation/Percussion Tenderness - Abdomen is non-tender to palpation. Rigidity (guarding) - Abdomen is soft. Auscultation Auscultation of the abdomen reveals - Bowel sounds normal.  Female Genitourinary Note: Not done, not pertinent to present illness   Musculoskeletal Note: On exam, she is a well-developed female who is alert and oriented and in no apparent distress. The left knee looks fantastic. There is minimal if any swelling. Range is 0 to 120 degrees with no tenderness or instability. Right knee range is about 5 to 115 degrees with marked crepitus on range of motion and tenderness medial greater than lateral with no instability noted.  Assessment & Plan Primary osteoarthritis of one knee, right (M17.11) Note:Surgical Plans: Right Total Knee Repalcement  Disposition: Home  PCP: Dr. Jonathon Jordan Cardiology: Dr. Mare Ferrari - Patient has been seen preoperatively and felt to be stable for surgery. Seh was instructed to stop her Eliquis 2 days prior to surgery.  IV TXA  Anesthesia Issues: None  Signed electronically by Joelene Millin, III PA-C

## 2014-08-21 NOTE — Transfer of Care (Signed)
Immediate Anesthesia Transfer of Care Note  Patient: Brandi Rodgers  Procedure(s) Performed: Procedure(s): RIGHT TOTAL KNEE ARTHROPLASTY (Right)  Patient Location: PACU  Anesthesia Type:General  Level of Consciousness: sedated  Airway & Oxygen Therapy: Patient Spontanous Breathing and Patient connected to face mask oxygen  Post-op Assessment: Report given to RN and Post -op Vital signs reviewed and stable  Post vital signs: Reviewed and stable  Last Vitals:  Filed Vitals:   08/21/14 1258  BP: 138/76  Pulse: 64  Temp: 36.7 C  Resp: 18    Complications: No apparent anesthesia complications

## 2014-08-21 NOTE — Op Note (Signed)
Pre-operative diagnosis- Osteoarthritis  Right knee(s)  Post-operative diagnosis- Osteoarthritis Right knee(s)  Procedure-  Right  Total Knee Arthroplasty (Attune system)  Surgeon- Dione Plover. Onis Markoff, MD  Assistant- [name], PA-C   Anesthesia-  Spinal  EBL-* No blood loss amount entered *   Drains Hemovac  Tourniquet time- 40 minutes @ 301 mm Hg  Complications- None  Condition-PACU - hemodynamically stable.   Brief Clinical Note  Brandi Rodgers is a 64 y.o. year old female with end stage OA of her right knee with progressively worsening pain and dysfunction. She has constant pain, with activity and at rest and significant functional deficits with difficulties even with ADLs. She has had extensive non-op management including analgesics, injections of cortisone and viscosupplements, and home exercise program, but remains in significant pain with significant dysfunction.Radiographs show bone on bone arthritis lateral and patellofemoral. She presents now for right Total Knee Arthroplasty.    Procedure in detail---   The patient is brought into the operating room and positioned supine on the operating table. After successful administration of  Spinal,   a tourniquet is placed high on the  Right thigh(s) and the lower extremity is prepped and draped in the usual sterile fashion. Time out is performed by the operating team and then the  Right lower extremity is wrapped in Esmarch, knee flexed and the tourniquet inflated to 300 mmHg.       A midline incision is made with a ten blade through the subcutaneous tissue to the level of the extensor mechanism. A fresh blade is used to make a medial parapatellar arthrotomy. Soft tissue over the proximal medial tibia is subperiosteally elevated to the joint line with a knife and into the semimembranosus bursa with a Cobb elevator. Soft tissue over the proximal lateral tibia is elevated with attention being paid to avoiding the patellar tendon on the  tibial tubercle. The patella is everted, knee flexed 90 degrees and the ACL and PCL are removed. Findings are bone on bone lateral and patellofemoral with large global osteophytes.        The drill is used to create a starting hole in the distal femur and the canal is thoroughly irrigated with sterile saline to remove the fatty contents. The 5 degree Right  valgus alignment guide is placed into the femoral canal and the distal femoral cutting block is pinned to remove 10 mm off the distal femur. Resection is made with an oscillating saw.      The tibia is subluxed forward and the menisci are removed. The extramedullary alignment guide is placed referencing proximally at the medial aspect of the tibial tubercle and distally along the second metatarsal axis and tibial crest. The block is pinned to remove 22mm off the more deficient lateral  side. Resection is made with an oscillating saw. Size 6is the most appropriate size for the tibia and the proximal tibia is prepared with the modular drill and keel punch for that size.      The femoral sizing guide is placed and size 6 is most appropriate. Rotation is marked off the epicondylar axis and confirmed by creating a rectangular flexion gap at 90 degrees. The size 6 cutting block is pinned in this rotation and the anterior, posterior and chamfer cuts are made with the oscillating saw. The intercondylar block is then placed and that cut is made.      Trial size 6 tibial component, trial size 6 posterior stabilized femur and a 7  mm posterior  stabilized rotating platform insert trial is placed. Full extension is achieved with excellent varus/valgus and anterior/posterior balance throughout full range of motion. The patella is everted and thickness measured to be 22  mm. Free hand resection is taken to 12 mm, a 35 template is placed, lug holes are drilled, trial patella is placed, and it tracks normally. Osteophytes are removed off the posterior femur with the trial in  place. All trials are removed and the cut bone surfaces prepared with pulsatile lavage. Cement is mixed and once ready for implantation, the size 6 tibial implant, size  6 posterior stabilized femoral component, and the size 35 patella are cemented in place and the patella is held with the clamp. The trial insert is placed and the knee held in full extension. The Exparel (20 ml mixed with 30 ml saline) and .25% Bupivicaine, are injected into the extensor mechanism, posterior capsule, medial and lateral gutters and subcutaneous tissues.  All extruded cement is removed and once the cement is hard the permanent 7 mm posterior stabilized rotating platform insert is placed into the tibial tray.      The wound is copiously irrigated with saline solution and the extensor mechanism closed over a hemovac drain with #1 V-loc suture. The tourniquet is released for a total tourniquet time of 40  minutes. Flexion against gravity is 130 degrees and the patella tracks normally. Subcutaneous tissue is closed with 2.0 vicryl and subcuticular with running 4.0 Monocryl. The incision is cleaned and dried and steri-strips and a bulky sterile dressing are applied. The limb is placed into a knee immobilizer and the patient is awakened and transported to recovery in stable condition.      Please note that a surgical assistant was a medical necessity for this procedure in order to perform it in a safe and expeditious manner. Surgical assistant was necessary to retract the ligaments and vital neurovascular structures to prevent injury to them and also necessary for proper positioning of the limb to allow for anatomic placement of the prosthesis.   Dione Plover Awesome Jared, MD    08/21/2014, 4:14 PM

## 2014-08-21 NOTE — Anesthesia Procedure Notes (Signed)
Procedure Name: Intubation Date/Time: 08/21/2014 3:05 PM Performed by: Dione Booze Pre-anesthesia Checklist: Patient identified, Emergency Drugs available, Suction available and Patient being monitored Patient Re-evaluated:Patient Re-evaluated prior to inductionOxygen Delivery Method: Circle system utilized Preoxygenation: Pre-oxygenation with 100% oxygen Intubation Type: IV induction Laryngoscope Size: Mac and 4 Grade View: Grade II Tube type: Oral Tube size: 7.5 mm Number of attempts: 1 Airway Equipment and Method: Stylet Secured at: 21 cm Tube secured with: Tape Dental Injury: Teeth and Oropharynx as per pre-operative assessment

## 2014-08-21 NOTE — Interval H&P Note (Signed)
History and Physical Interval Note:  08/21/2014 2:35 PM  Brandi Rodgers  has presented today for surgery, with the diagnosis of OA RIGHT KNEE  The various methods of treatment have been discussed with the patient and family. After consideration of risks, benefits and other options for treatment, the patient has consented to  Procedure(s): RIGHT TOTAL KNEE ARTHROPLASTY (Right) as a surgical intervention .  The patient's history has been reviewed, patient examined, no change in status, stable for surgery.  I have reviewed the patient's chart and labs.  Questions were answered to the patient's satisfaction.     [name]

## 2014-08-22 LAB — CBC
HCT: 33.6 % — ABNORMAL LOW (ref 36.0–46.0)
HEMOGLOBIN: 10.9 g/dL — AB (ref 12.0–15.0)
MCH: 27.7 pg (ref 26.0–34.0)
MCHC: 32.4 g/dL (ref 30.0–36.0)
MCV: 85.5 fL (ref 78.0–100.0)
Platelets: 152 10*3/uL (ref 150–400)
RBC: 3.93 MIL/uL (ref 3.87–5.11)
RDW: 16 % — AB (ref 11.5–15.5)
WBC: 10.3 10*3/uL (ref 4.0–10.5)

## 2014-08-22 LAB — BASIC METABOLIC PANEL
ANION GAP: 7 (ref 5–15)
BUN: 7 mg/dL (ref 6–23)
CALCIUM: 8.9 mg/dL (ref 8.4–10.5)
CO2: 28 mmol/L (ref 19–32)
Chloride: 104 mmol/L (ref 96–112)
Creatinine, Ser: 0.6 mg/dL (ref 0.50–1.10)
GFR calc non Af Amer: >90 mL/min (ref >90)
Glucose, Bld: 163 mg/dL — ABNORMAL HIGH (ref 70–99)
Potassium: 4.3 mmol/L (ref 3.5–5.1)
Sodium: 139 mmol/L (ref 135–145)

## 2014-08-22 NOTE — Progress Notes (Signed)
   Subjective: 1 Day Post-Op Procedure(s) (LRB): RIGHT TOTAL KNEE ARTHROPLASTY (Right) Patient reports pain as mild.   Patient seen in rounds with Dr. Wynelle Link. Patient is well, and has had no acute complaints or problems We will start therapy today.  Plan is to go Home after hospital stay.  Objective: Vital signs in last 24 hours: Temp:  [97.6 F (36.4 C)-98.6 F (37 C)] 97.6 F (36.4 C) (04/09 0609) Pulse Rate:  [62-96] 64 (04/09 0609) Resp:  [11-18] 15 (04/09 0609) BP: (134-171)/(67-94) 134/71 mmHg (04/09 0609) SpO2:  [93 %-99 %] 98 % (04/09 0609) Weight:  [128.005 kg (282 lb 3.2 oz)] 128.005 kg (282 lb 3.2 oz) (04/08 1336)  Intake/Output from previous day:  Intake/Output Summary (Last 24 hours) at 08/22/14 0732 Last data filed at 08/22/14 0645  Gross per 24 hour  Intake   2140 ml  Output   5080 ml  Net  -2940 ml   Labs:  Recent Labs  08/22/14 0410  HGB 10.9*    Recent Labs  08/22/14 0410  WBC 10.3  RBC 3.93  HCT 33.6*  PLT 152    Recent Labs  08/22/14 0410  NA 139  K 4.3  CL 104  CO2 28  BUN 7  CREATININE 0.60  GLUCOSE 163*  CALCIUM 8.9   No results for input(s): LABPT, INR in the last 72 hours.  EXAM General - Patient is Alert, Appropriate and Oriented Extremity - Neurovascular intact Sensation intact distally Dorsiflexion/Plantar flexion intact Dressing - dressing C/D/I Motor Function - intact, moving foot and toes well on exam.  Hemovac pulled without difficulty.  Past Medical History  Diagnosis Date  . Paroxysmal a-fib   . Family history of adverse reaction to anesthesia     son has had nausea and vomiting   . Dysrhythmia     paroxsymal A Fib   . Sleep apnea     uses mouth guard  . Pneumonia     hx of   . Bronchitis     hx of   . GERD (gastroesophageal reflux disease)   . Headache     hx of migraines  . Cancer     hx of skin cancers upper arms bilat   . Chronic cystitis   . Shingles     hx of   . Hypercholesterolemia     . Varicose veins     hx of   . Urinary tract infection     hx of   . Measles     hx of   . Mumps     hx of   . Degenerative lumbar disc     hx of   . Osteoarthritis     left knee  . Chronic low back pain   . Hemorrhoids     hx of     Assessment/Plan: 1 Day Post-Op Procedure(s) (LRB): RIGHT TOTAL KNEE ARTHROPLASTY (Right) Principal Problem:   OA (osteoarthritis) of knee  Estimated body mass index is 42.92 kg/(m^2) as calculated from the following:   Height as of this encounter: 5\' 8"  (1.727 m).   Weight as of this encounter: 128.005 kg (282 lb 3.2 oz). Advance diet Up with therapy Plan for discharge tomorrow Discharge home with home health  DVT Prophylaxis - Eliquis  Aquacel Dressing and Derma bond wound coverage. Weight-Bearing as tolerated to right leg D/C O2 and Pulse OX and try on Room Air  [name], PA-C Orthopaedic Surgery 08/22/2014, 7:32 AM

## 2014-08-22 NOTE — Evaluation (Signed)
Physical Therapy Evaluation Patient Details Name: Brandi Rodgers MRN: [id number] DOB: 1950-08-24 Today's Date: 08/22/2014   History of Present Illness  Pt is s/p R TKA  Clinical Impression  Patient is progressing very well, plans DC tomorrow, Patient will benefit from PT to address problems listed in note below.    Follow Up Recommendations Home health PT;Supervision/Assistance - 24 hour    Equipment Recommendations  None recommended by PT    Recommendations for Other Services       Precautions / Restrictions Precautions Precautions: Knee Precaution Comments: pt does not require KI, + SLR Restrictions Weight Bearing Restrictions: No      Mobility  Bed Mobility Overal bed mobility: Needs Assistance Bed Mobility: Sit to Supine     Supine to sit: Supervision Sit to supine: Supervision   General bed mobility comments: able to placwe R leg onto bed  Transfers Overall transfer level: Needs assistance Equipment used: Rolling walker (2 wheeled) Transfers: Sit to/from Stand Sit to Stand: Supervision         General transfer comment: up from toilet with no assistance,  Ambulation/Gait Ambulation/Gait assistance: Supervision Ambulation Distance (Feet): 200 Feet Assistive device: Rolling walker (2 wheeled) Gait Pattern/deviations: Step-to pattern;Step-through pattern     General Gait Details: cues for rolling  walker  Stairs            Wheelchair Mobility    Modified Rankin (Stroke Patients Only)       Balance                                             Pertinent Vitals/Pain Pain Assessment: 0-10 Pain Score: 2  Pain Location: R knee, calf Pain Descriptors / Indicators: Aching;Cramping Pain Intervention(s): Monitored during session;Repositioned;Ice applied;Premedicated before session    Home Living Family/patient expects to be discharged to:: Private residence Living Arrangements: Spouse/significant other Available Help at  Discharge: Family Type of Home: House Home Access: Stairs to enter Entrance Stairs-Rails: Right Entrance Stairs-Number of Steps: 4 Home Layout: One level Home Equipment: Environmental consultant - 2 wheels;Walker - standard;Cane - single point;Grab bars - toilet      Prior Function Level of Independence: Independent               Hand Dominance   Dominant Hand: Right    Extremity/Trunk Assessment   Upper Extremity Assessment: Overall WFL for tasks assessed           Lower Extremity Assessment: RLE deficits/detail RLE Deficits / Details: + SLR, knee flexion 75     Cervical / Trunk Assessment: Normal  Communication   Communication: No difficulties  Cognition Arousal/Alertness: Awake/alert Behavior During Therapy: WFL for tasks assessed/performed Overall Cognitive Status: Within Functional Limits for tasks assessed                      General Comments      Exercises Total Joint Exercises Ankle Circles/Pumps: AROM;Both;10 reps;Supine Quad Sets: AROM;Both;10 reps;Supine Short Arc Quad: AROM;Right;Supine;10 reps Heel Slides: AROM;Right;Supine;10 reps Hip ABduction/ADduction: AROM;Right;Supine;10 reps Straight Leg Raises: AROM;Right;10 reps Goniometric ROM: 5-75  L knee      Assessment/Plan    PT Assessment Patient needs continued PT services  PT Diagnosis Difficulty walking   PT Problem List Decreased strength;Decreased range of motion;Decreased activity tolerance;Pain  PT Treatment Interventions DME instruction;Gait training;Stair training;Functional mobility training;Therapeutic activities;Therapeutic exercise;Patient/family education  PT Goals (Current goals can be found in the Care Plan section) Acute Rehab PT Goals Patient Stated Goal: home PT Goal Formulation: With patient/family Time For Goal Achievement: 08/24/14 Potential to Achieve Goals: Good    Frequency 7X/week   Barriers to discharge        Co-evaluation               End of  Session   Activity Tolerance: Patient tolerated treatment well Patient left: in bed;with call bell/phone within reach;with family/visitor present Nurse Communication: Mobility status         Time: 7915-0569 PT Time Calculation (min) (ACUTE ONLY): 41 min   Charges:   PT Evaluation $Initial PT Evaluation Tier I: 1 Procedure PT Treatments $Gait Training: 8-22 mins $Therapeutic Exercise: 8-22 mins   PT G Codes:        Claretha Cooper 08/22/2014, 11:28 AM  Tresa Endo PT [phone]

## 2014-08-22 NOTE — Evaluation (Addendum)
Occupational Therapy Evaluation Patient Details Name: Brandi Rodgers MRN: [id number] DOB: Nov 20, 1950 Today's Date: 08/22/2014    History of Present Illness Pt is s/p R TKA   Clinical Impression   Pt doing well. Up to the sink to perform grooming with the walker. She is planning d/c home with spouse. Will follow to progress safety and independence with self care tasks for d/c.     Follow Up Recommendations  Supervision/Assistance - 24 hour;No OT follow up    Equipment Recommendations  None recommended by OT    Recommendations for Other Services       Precautions / Restrictions Precautions Precautions: Knee Restrictions Weight Bearing Restrictions: No      Mobility Bed Mobility Overal bed mobility: Needs Assistance Bed Mobility: Supine to Sit     Supine to sit: Supervision        Transfers Overall transfer level: Needs assistance Equipment used: Rolling walker (2 wheeled) Transfers: Sit to/from Stand Sit to Stand: Min guard         General transfer comment: verbal cues for hand placement.    Balance                                            ADL   Eating/Feeding: Independent;Sitting   Grooming: Wash/dry hands;Oral care;Min guard;Standing   Upper Body Bathing: Set up;Sitting   Lower Body Bathing: Minimal assistance;Sit to/from stand   Upper Body Dressing : Set up;Sitting   Lower Body Dressing: Minimal assistance;Sit to/from stand   Toilet Transfer: Min guard;Ambulation;RW   Toileting- Water quality scientist and Hygiene: Min guard;Sit to/from stand         General ADL Comments: Pt on the way out of the bathroom with nursing tech and OT assisted back to EOB. Pt able to perform SLR with OT. Assisted pt to transfer around EOB to sink with walker to groom. Pt doing very well and needed min cues for walker sequence. She has all AE from previous surgery. Practiced with sock aid at EOB. Verbally reviewed shower transfer technique.  Pt states her shower is too small for a 3in1 or chair so discussed use of walker in shower for safety if she plans to stand versus sponge bathe initially. Practiced with sock aid to don sock. Reviewed sequence of LB dressing.     Vision     Perception     Praxis      Pertinent Vitals/Pain Pain Assessment: 0-10 Pain Score: 4  Pain Location: R knee Pain Descriptors / Indicators: Aching Pain Intervention(s): Repositioned;Ice applied     Hand Dominance Right   Extremity/Trunk Assessment Upper Extremity Assessment Upper Extremity Assessment: Overall WFL for tasks assessed           Communication Communication Communication: No difficulties   Cognition Arousal/Alertness: Awake/alert Behavior During Therapy: WFL for tasks assessed/performed Overall Cognitive Status: Within Functional Limits for tasks assessed                     General Comments       Exercises       Shoulder Instructions      Home Living Family/patient expects to be discharged to:: Private residence Living Arrangements: Spouse/significant other Available Help at Discharge: Family Type of Home: House Home Access: Stairs to enter [employer] of Steps: 4 Entrance Stairs-Rails: Right Home Layout: One level  Bathroom Shower/Tub: Occupational psychologist: Standard (pt states more like comfort height commode "not real low" )     Home Equipment: Walker - 2 wheels;Walker - standard;Cane - single point;Grab bars - toilet          Prior Functioning/Environment Level of Independence: Independent             OT Diagnosis: Generalized weakness   OT Problem List: Decreased strength;Decreased knowledge of use of DME or AE   OT Treatment/Interventions: Self-care/ADL training;Patient/family education;Therapeutic activities;DME and/or AE instruction    OT Goals(Current goals can be found in the care plan section) Acute Rehab OT Goals Patient Stated Goal: home OT  Goal Formulation: With patient Time For Goal Achievement: 08/29/14 Potential to Achieve Goals: Good  OT Frequency: Min 2X/week   Barriers to D/C:            Co-evaluation              End of Session Equipment Utilized During Treatment: Rolling walker  Activity Tolerance: Patient tolerated treatment well Patient left: in chair;with call bell/phone within reach   Time: 0905-0945 OT Time Calculation (min): 40 min Charges:  OT General Charges $OT Visit: 1 Procedure OT Evaluation $Initial OT Evaluation Tier I: 1 Procedure OT Treatments $Self Care/Home Management : 8-22 mins $Therapeutic Activity: 8-22 mins G-Codes:    Jules Schick  [phone] 08/22/2014, 10:27 AM

## 2014-08-22 NOTE — Progress Notes (Signed)
Physical Therapy Treatment Patient Details Name: Brandi Rodgers MRN: [id number] DOB: Oct 01, 1950 Today's Date: 08/22/2014    History of Present Illness Pt is s/p R TKA    PT Comments    Progressing very well.  Follow Up Recommendations  Home health PT;Supervision/Assistance - 24 hour     Equipment Recommendations  None recommended by PT    Recommendations for Other Services       Precautions / Restrictions Precautions Precautions: Knee Precaution Comments: pt does not require KI, + SLR    Mobility  Bed Mobility Overal bed mobility: Independent                Transfers   Equipment used: Rolling walker (2 wheeled) Transfers: Sit to/from Stand Sit to Stand: Supervision         General transfer comment: up from toilet with no assistance,  Ambulation/Gait Ambulation/Gait assistance: Supervision Ambulation Distance (Feet): 260 Feet Assistive device: Rolling walker (2 wheeled) Gait Pattern/deviations: Step-through pattern     General Gait Details: cues for rolling  walker   Stairs            Wheelchair Mobility    Modified Rankin (Stroke Patients Only)       Balance                                    Cognition Arousal/Alertness: Awake/alert                          Exercises Total Joint Exercises Quad Sets: AROM;Both;10 reps;Supine Heel Slides: AROM;Right;Supine;10 reps Straight Leg Raises: AROM;Right;10 reps    General Comments        Pertinent Vitals/Pain Pain Score: 1  Pain Location: R knee Pain Descriptors / Indicators: Tightness Pain Intervention(s): Ice applied    Home Living                      Prior Function            PT Goals (current goals can now be found in the care plan section) Progress towards PT goals: Progressing toward goals    Frequency  7X/week    PT Plan Current plan remains appropriate    Co-evaluation             End of Session   Activity  Tolerance: Patient tolerated treatment well Patient left: in bed;with call bell/phone within reach;with family/visitor present     Time: 5427-0623 PT Time Calculation (min) (ACUTE ONLY): 25 min  Charges:  $Gait Training: 8-22 mins $Therapeutic Exercise: 8-22 mins                    G Codes:      Claretha Cooper 08/22/2014, 3:27 PM

## 2014-08-23 LAB — CBC
HEMATOCRIT: 32.6 % — AB (ref 36.0–46.0)
Hemoglobin: 10.4 g/dL — ABNORMAL LOW (ref 12.0–15.0)
MCH: 27.9 pg (ref 26.0–34.0)
MCHC: 31.9 g/dL (ref 30.0–36.0)
MCV: 87.4 fL (ref 78.0–100.0)
PLATELETS: 157 10*3/uL (ref 150–400)
RBC: 3.73 MIL/uL — AB (ref 3.87–5.11)
RDW: 16.7 % — AB (ref 11.5–15.5)
WBC: 10.4 10*3/uL (ref 4.0–10.5)

## 2014-08-23 LAB — BASIC METABOLIC PANEL
Anion gap: 5 (ref 5–15)
BUN: 11 mg/dL (ref 6–23)
CO2: 30 mmol/L (ref 19–32)
Calcium: 8.8 mg/dL (ref 8.4–10.5)
Chloride: 103 mmol/L (ref 96–112)
Creatinine, Ser: 0.57 mg/dL (ref 0.50–1.10)
GFR calc Af Amer: >90 mL/min (ref >90)
Glucose, Bld: 136 mg/dL — ABNORMAL HIGH (ref 70–99)
POTASSIUM: 4.6 mmol/L (ref 3.5–5.1)
SODIUM: 138 mmol/L (ref 135–145)

## 2014-08-23 MED ORDER — TRAMADOL HCL 50 MG PO TABS: 50.0000 mg | ORAL_TABLET | Freq: Four times a day (QID) | ORAL | Status: DC | PRN

## 2014-08-23 MED ORDER — OXYCODONE HCL 5 MG PO TABS: 5.0000 mg | ORAL_TABLET | ORAL | Status: DC | PRN

## 2014-08-23 MED ORDER — CYCLOBENZAPRINE HCL 5 MG PO TABS: 5.0000 mg | ORAL_TABLET | Freq: Three times a day (TID) | ORAL | Status: DC | PRN

## 2014-08-23 NOTE — Care Management Note (Signed)
CARE MANAGEMENT NOTE 08/23/2014  Patient:  Brandi Rodgers, Brandi Rodgers   Account Number:  [id number]  Date Initiated:  08/23/2014  Documentation initiated by:  Apolonio Schneiders  Subjective/Objective Assessment:   RIGHT TOTAL KNEE ARTHROPLASTY (Right)     Action/Plan:   Anticipated DC Date:  08/23/2014   Anticipated DC Plan:  [place]  CM consult      [facility] Choice  HOME HEALTH   Choice offered to / List presented to:          [facility] arranged  HH-2 PT      [place]   Status of service:  Completed, signed off Medicare Important Message given?   (If response is "NO", the following Medicare IM given date fields will be blank) Date Medicare IM given:   Medicare IM given by:   Date Additional Medicare IM given:   Additional Medicare IM given by:    Discharge Disposition:    Per UR Regulation:    If discussed at Long Length of Stay Meetings, dates discussed:    Comments:  08/23/14 1000 - CM spoke with patient at the bedside. Home care is set up with [facility]. Patient has used [place] in the past. She has a walker and does not need a 3N1. Venita Sheffield RN BSN CCM [phone]

## 2014-08-23 NOTE — Plan of Care (Signed)
Problem: Discharge Progression Outcomes Goal: Anticoagulant follow-up in place Outcome: Not Applicable Date Met:  67/17/79 eliquis

## 2014-08-23 NOTE — Progress Notes (Signed)
Physical Therapy Treatment Patient Details Name: JISELE PRICE MRN: [id number] DOB: 1950-12-13 Today's Date: 08-29-2014    History of Present Illness Pt is s/p R TKA    PT Comments    Pt progressing well  Follow Up Recommendations  Home health PT;Supervision/Assistance - 24 hour     Equipment Recommendations  None recommended by PT    Recommendations for Other Services       Precautions / Restrictions Precautions Precautions: Knee Precaution Comments: pt does not require KI, + SLR Restrictions Weight Bearing Restrictions: No Other Position/Activity Restrictions: WBAT    Mobility  Bed Mobility Overal bed mobility: Needs Assistance Bed Mobility: Supine to Sit     Supine to sit: Min guard     General bed mobility comments: facilitation of RLE movement to bring leg off bed  Transfers Overall transfer level: Needs assistance Equipment used: Rolling walker (2 wheeled) Transfers: Sit to/from Stand Sit to Stand: Modified independent (Device/Increase time)            Ambulation/Gait Ambulation/Gait assistance: Supervision;Modified independent (Device/Increase time) Ambulation Distance (Feet): 200 Feet Assistive device: Rolling walker (2 wheeled) Gait Pattern/deviations: Step-through pattern     General Gait Details: pt demo's good technique with walker   Stairs Stairs: Yes Stairs assistance: Supervision Stair Management: One rail Left;Forwards;Step to pattern;With cane Number of Stairs: 3 General stair comments: cues initially for sequence  Wheelchair Mobility    Modified Rankin (Stroke Patients Only)       Balance                                    Cognition Arousal/Alertness: Awake/alert Behavior During Therapy: WFL for tasks assessed/performed Overall Cognitive Status: Within Functional Limits for tasks assessed                      Exercises Total Joint Exercises Ankle Circles/Pumps: AROM;Both;10  reps;Supine Quad Sets: AROM;Both;10 reps;Supine Heel Slides: AROM;Right;Supine;10 reps Hip ABduction/ADduction: AROM;Right;Supine;10 reps Straight Leg Raises: AROM;Right;10 reps Goniometric ROM: grossly 5 to 70*    General Comments        Pertinent Vitals/Pain Pain Assessment: 0-10 Pain Score: 2  Pain Location: R knee Pain Descriptors / Indicators: Sore Pain Intervention(s): Limited activity within patient's tolerance;Monitored during session;Ice applied    Home Living                      Prior Function            PT Goals (current goals can now be found in the care plan section) Acute Rehab PT Goals Patient Stated Goal: home PT Goal Formulation: With patient/family Time For Goal Achievement: 08/24/14 Potential to Achieve Goals: Good Progress towards PT goals: Progressing toward goals    Frequency  7X/week    PT Plan Current plan remains appropriate    Co-evaluation             End of Session Equipment Utilized During Treatment: Gait belt Activity Tolerance: Patient tolerated treatment well Patient left: in chair;with call bell/phone within reach     Time: 1033-1057 PT Time Calculation (min) (ACUTE ONLY): 24 min  Charges:  $Gait Training: 8-22 mins $Therapeutic Exercise: 8-22 mins                    G Codes:      Kyrene Longan 08-29-2014, 12:27 PM

## 2014-08-23 NOTE — Progress Notes (Signed)
Discharged from floor via w/c, belongings & family with pt. No changes in assessment. Brandi Rodgers  

## 2014-08-23 NOTE — Progress Notes (Signed)
Subjective: 2 Days Post-Op Procedure(s) (LRB): RIGHT TOTAL KNEE ARTHROPLASTY (Right) Patient reports pain as mild.   Patient seen in rounds with Dr. Wynelle Link. Wants to go home. Patient is well, and has had no acute complaints or problems Patient is ready to go home today  Objective: Vital signs in last 24 hours: Temp:  [97.8 F (36.6 C)-98.8 F (37.1 C)] 97.8 F (36.6 C) (04/10 0634) Pulse Rate:  [54-61] 54 (04/10 0634) Resp:  [15-20] 18 (04/10 0634) BP: (132-145)/(64-81) 145/81 mmHg (04/10 0634) SpO2:  [95 %-98 %] 96 % (04/10 0634)  Intake/Output from previous day:  Intake/Output Summary (Last 24 hours) at 08/23/14 0741 Last data filed at 08/23/14 0634  Gross per 24 hour  Intake   1080 ml  Output   1000 ml  Net     80 ml     Labs:  Recent Labs  08/22/14 0410 08/23/14 0426  HGB 10.9* 10.4*    Recent Labs  08/22/14 0410 08/23/14 0426  WBC 10.3 10.4  RBC 3.93 3.73*  HCT 33.6* 32.6*  PLT 152 157    Recent Labs  08/22/14 0410 08/23/14 0426  NA 139 138  K 4.3 4.6  CL 104 103  CO2 28 30  BUN 7 11  CREATININE 0.60 0.57  GLUCOSE 163* 136*  CALCIUM 8.9 8.8   No results for input(s): LABPT, INR in the last 72 hours.  EXAM: General - Patient is Alert, Appropriate and Oriented Extremity - Neurovascular intact Sensation intact distally No cellulitis present Dressing/Incision - clean, dry Motor Function - intact, moving foot and toes well on exam.   Assessment/Plan: 2 Days Post-Op Procedure(s) (LRB): RIGHT TOTAL KNEE ARTHROPLASTY (Right) Procedure(s) (LRB): RIGHT TOTAL KNEE ARTHROPLASTY (Right) Past Medical History  Diagnosis Date  . Paroxysmal a-fib   . Family history of adverse reaction to anesthesia     son has had nausea and vomiting   . Dysrhythmia     paroxsymal A Fib   . Sleep apnea     uses mouth guard  . Pneumonia     hx of   . Bronchitis     hx of   . GERD (gastroesophageal reflux disease)   . Headache     hx of migraines  .  Cancer     hx of skin cancers upper arms bilat   . Chronic cystitis   . Shingles     hx of   . Hypercholesterolemia   . Varicose veins     hx of   . Urinary tract infection     hx of   . Measles     hx of   . Mumps     hx of   . Degenerative lumbar disc     hx of   . Osteoarthritis     left knee  . Chronic low back pain   . Hemorrhoids     hx of    Principal Problem:   OA (osteoarthritis) of knee  Estimated body mass index is 42.92 kg/(m^2) as calculated from the following:   Height as of this encounter: 5\' 8"  (1.727 m).   Weight as of this encounter: 128.005 kg (282 lb 3.2 oz). Up with therapy Discharge home with home health Diet - Cardiac diet Follow up - in 2 weeks Activity - WBAT Disposition - Home Condition Upon Discharge - Good D/C Meds - See DC Summary DVT Prophylaxis - Eliquis  [name], PA-C Orthopaedic Surgery 08/23/2014, 7:41 AM

## 2014-08-23 NOTE — Discharge Instructions (Signed)
Dr. Gaynelle Arabian Total Joint Specialist [facility] [address], [place], [place] [address] [phone]  TOTAL KNEE REPLACEMENT POSTOPERATIVE DIRECTIONS    Knee Rehabilitation, Guidelines Following Surgery  Results after knee surgery are often greatly improved when you follow the exercise, range of motion and muscle strengthening exercises prescribed by your doctor. Safety measures are also important to protect the knee from further injury. Any time any of these exercises cause you to have increased pain or swelling in your knee joint, decrease the amount until you are comfortable again and slowly increase them. If you have problems or questions, call your caregiver or physical therapist for advice.   HOME CARE INSTRUCTIONS  Remove items at home which could result in a fall. This includes throw rugs or furniture in walking pathways.  Continue medications as instructed at time of discharge. You may have some home medications which will be placed on hold until you complete the course of blood thinner medication.  You may start showering once you are discharged home but do not submerge the incision under water. Just pat the incision dry and apply a dry gauze dressing on daily  Walk with walker as instructed.  You may resume a sexual relationship in one month or when given the OK by  your doctor.   Use walker as long as suggested by your caregivers.  Avoid periods of inactivity such as sitting longer than an hour when not asleep. This helps prevent blood clots.  Weight bearing as tolerated with assist device (walker, cane, etc) as directed, use it as long as suggested by your surgeon or therapist, typically at least 4-6 weeks.  You may return to work once you are cleared by your doctor.  Do not drive a car for 6 weeks or until released by you surgeon.   Do not drive while taking narcotics.  Wear the elastic stockings for three weeks following surgery during  the day but you may remove then at night. Make sure you keep all of your appointments after your operation with all of your doctors and caregivers. You should call the office at the above phone number and make an appointment for approximately two weeks after the date of your surgery. Change the dressing daily and reapply a dry dressing each time. Please pick up a stool softener and laxative for home use as long as you are requiring pain medications.  ICE to the affected knee every three hours for 30 minutes at a time and then as needed for pain and swelling.  Continue to use ice on the knee for pain and swelling from surgery. You may notice swelling that will progress down to the foot and ankle.  This is normal after surgery.  Elevate the leg when you are not up walking on it.   It is important for you to complete the blood thinner medication as prescribed by your doctor.  Continue to use the breathing machine which will help keep your temperature down.  It is common for your temperature to cycle up and down following surgery, especially at night when you are not up moving around and exerting yourself.  The breathing machine keeps your lungs expanded and your temperature down.  RANGE OF MOTION AND STRENGTHENING EXERCISES  Rehabilitation of the knee is important following a knee injury or an operation. After just a few days of immobilization, the muscles of the thigh which control the knee become weakened and shrink (atrophy). Knee exercises are designed to  build up the tone and strength of the thigh muscles and to improve knee motion. Often times heat used for twenty to thirty minutes before working out will loosen up your tissues and help with improving the range of motion but do not use heat for the first two weeks following surgery. These exercises can be done on a training (exercise) mat, on the floor, on a table or on a bed. Use what ever works the best and is most comfortable for you Knee exercises  include:  Leg Lifts - While your knee is still immobilized in a splint or cast, you can do straight leg raises. Lift the leg to 60 degrees, hold for 3 sec, and slowly lower the leg. Repeat 10-20 times 2-3 times daily. Perform this exercise against resistance later as your knee gets better.  Quad and Hamstring Sets - Tighten up the muscle on the front of the thigh (Quad) and hold for 5-10 sec. Repeat this 10-20 times hourly. Hamstring sets are done by pushing the foot backward against an object and holding for 5-10 sec. Repeat as with quad sets.  A rehabilitation program following serious knee injuries can speed recovery and prevent re-injury in the future due to weakened muscles. Contact your doctor or a physical therapist for more information on knee rehabilitation.   SKILLED REHAB INSTRUCTIONS: If the patient is transferred to a skilled rehab facility following release from the hospital, a list of the current medications will be sent to the facility for the patient to continue.  When discharged from the skilled rehab facility, please have the facility set up the patient's [place] prior to being released. Also, the skilled facility will be responsible for providing the patient with their medications at time of release from the facility to include their pain medication, the muscle relaxants, and their blood thinner medication. If the patient is still at the rehab facility at time of the two week follow up appointment, the skilled rehab facility will also need to assist the patient in arranging follow up appointment in our office and any transportation needs.  MAKE SURE YOU:  Understand these instructions.  Will watch your condition.  Will get help right away if you are not doing well or get worse.    Pick up stool softner and laxative for home use following surgery while on pain medications. Do not submerge incision under water. Please use good hand washing techniques while  changing dressing each day. May shower starting three days after surgery. Please use a clean towel to pat the incision dry following showers. Continue to use ice for pain and swelling after surgery. Do not use any lotions or creams on the incision until instructed by your surgeon.

## 2014-08-24 ENCOUNTER — Encounter (HOSPITAL_COMMUNITY): Payer: Self-pay | Admitting: Orthopedic Surgery

## 2014-08-27 NOTE — Discharge Summary (Signed)
Physician Discharge Summary   Patient ID: Brandi Rodgers MRN: [id number] DOB/AGE: Mar 18, 1951 64 y.o.  Admit date: 08/21/2014 Discharge date: 08/23/2014  Primary Diagnosis:  Osteoarthritis Right knee(s)  Admission Diagnoses:  Past Medical History  Diagnosis Date  . Paroxysmal a-fib   . Family history of adverse reaction to anesthesia     son has had nausea and vomiting   . Dysrhythmia     paroxsymal A Fib   . Sleep apnea     uses mouth guard  . Pneumonia     hx of   . Bronchitis     hx of   . GERD (gastroesophageal reflux disease)   . Headache     hx of migraines  . Cancer     hx of skin cancers upper arms bilat   . Chronic cystitis   . Shingles     hx of   . Hypercholesterolemia   . Varicose veins     hx of   . Urinary tract infection     hx of   . Measles     hx of   . Mumps     hx of   . Degenerative lumbar disc     hx of   . Osteoarthritis     left knee  . Chronic low back pain   . Hemorrhoids     hx of    Discharge Diagnoses:   Principal Problem:   OA (osteoarthritis) of knee  Estimated body mass index is 42.92 kg/(m^2) as calculated from the following:   Height as of this encounter: '5\' 8"'  (1.727 m).   Weight as of this encounter: 128.005 kg (282 lb 3.2 oz).  Procedure:  Procedure(s) (LRB): RIGHT TOTAL KNEE ARTHROPLASTY (Right)   Consults: None  HPI: Brandi Rodgers is a 64 y.o. year old female with end stage OA of her right knee with progressively worsening pain and dysfunction. She has constant pain, with activity and at rest and significant functional deficits with difficulties even with ADLs. She has had extensive non-op management including analgesics, injections of cortisone and viscosupplements, and home exercise program, but remains in significant pain with significant dysfunction.Radiographs show bone on bone arthritis lateral and patellofemoral. She presents now for right Total Knee Arthroplasty.   Laboratory Data: Admission on  08/21/2014, Discharged on 08/23/2014  Component Date Value Ref Range Status  . ABO/RH(D) 08/21/2014 O POS   Final  . Antibody Screen 08/21/2014 NEG   Final  . Sample Expiration 08/21/2014 08/24/2014   Final  . WBC 08/22/2014 10.3  4.0 - 10.5 K/uL Final  . RBC 08/22/2014 3.93  3.87 - 5.11 MIL/uL Final  . Hemoglobin 08/22/2014 10.9* 12.0 - 15.0 g/dL Final  . HCT 08/22/2014 33.6* 36.0 - 46.0 % Final  . MCV 08/22/2014 85.5  78.0 - 100.0 fL Final  . MCH 08/22/2014 27.7  26.0 - 34.0 pg Final  . MCHC 08/22/2014 32.4  30.0 - 36.0 g/dL Final  . RDW 08/22/2014 16.0* 11.5 - 15.5 % Final  . Platelets 08/22/2014 152  150 - 400 K/uL Final  . Sodium 08/22/2014 139  135 - 145 mmol/L Final  . Potassium 08/22/2014 4.3  3.5 - 5.1 mmol/L Final  . Chloride 08/22/2014 104  96 - 112 mmol/L Final  . CO2 08/22/2014 28  19 - 32 mmol/L Final  . Glucose, Bld 08/22/2014 163* 70 - 99 mg/dL Final  . BUN 08/22/2014 7  6 - 23 mg/dL Final  . Creatinine, Ser  08/22/2014 0.60  0.50 - 1.10 mg/dL Final  . Calcium 08/22/2014 8.9  8.4 - 10.5 mg/dL Final  . GFR calc non Af Amer 08/22/2014 >90  >90 mL/min Final  . GFR calc Af Amer 08/22/2014 >90  >90 mL/min Final   Comment: (NOTE) The eGFR has been calculated using the CKD EPI equation. This calculation has not been validated in all clinical situations. eGFR's persistently <90 mL/min signify possible Chronic Kidney Disease.   . Anion gap 08/22/2014 7  5 - 15 Final  . WBC 08/23/2014 10.4  4.0 - 10.5 K/uL Final  . RBC 08/23/2014 3.73* 3.87 - 5.11 MIL/uL Final  . Hemoglobin 08/23/2014 10.4* 12.0 - 15.0 g/dL Final  . HCT 08/23/2014 32.6* 36.0 - 46.0 % Final  . MCV 08/23/2014 87.4  78.0 - 100.0 fL Final  . MCH 08/23/2014 27.9  26.0 - 34.0 pg Final  . MCHC 08/23/2014 31.9  30.0 - 36.0 g/dL Final  . RDW 08/23/2014 16.7* 11.5 - 15.5 % Final  . Platelets 08/23/2014 157  150 - 400 K/uL Final  . Sodium 08/23/2014 138  135 - 145 mmol/L Final  . Potassium 08/23/2014 4.6  3.5 - 5.1  mmol/L Final  . Chloride 08/23/2014 103  96 - 112 mmol/L Final  . CO2 08/23/2014 30  19 - 32 mmol/L Final  . Glucose, Bld 08/23/2014 136* 70 - 99 mg/dL Final  . BUN 08/23/2014 11  6 - 23 mg/dL Final  . Creatinine, Ser 08/23/2014 0.57  0.50 - 1.10 mg/dL Final  . Calcium 08/23/2014 8.8  8.4 - 10.5 mg/dL Final  . GFR calc non Af Amer 08/23/2014 >90  >90 mL/min Final  . GFR calc Af Amer 08/23/2014 >90  >90 mL/min Final   Comment: (NOTE) The eGFR has been calculated using the CKD EPI equation. This calculation has not been validated in all clinical situations. eGFR's persistently <90 mL/min signify possible Chronic Kidney Disease.   Georgiann Hahn gap 08/23/2014 5  5 - 15 Final  Hospital Outpatient Visit on 08/13/2014  Component Date Value Ref Range Status  . MRSA, PCR 08/13/2014 NEGATIVE  NEGATIVE Final  . Staphylococcus aureus 08/13/2014 NEGATIVE  NEGATIVE Final   Comment:        The Xpert SA Assay (FDA approved for NASAL specimens in patients over 56 years of age), is one component of a comprehensive surveillance program.  Test performance has been validated by [facility] for patients greater than or equal to 35 year old. It is not intended to diagnose infection nor to guide or monitor treatment.   [name] aPTT 08/13/2014 34  24 - 37 seconds Final  . WBC 08/13/2014 8.7  4.0 - 10.5 K/uL Final  . RBC 08/13/2014 4.61  3.87 - 5.11 MIL/uL Final  . Hemoglobin 08/13/2014 12.6  12.0 - 15.0 g/dL Final  . HCT 08/13/2014 39.2  36.0 - 46.0 % Final  . MCV 08/13/2014 85.0  78.0 - 100.0 fL Final  . MCH 08/13/2014 27.3  26.0 - 34.0 pg Final  . MCHC 08/13/2014 32.1  30.0 - 36.0 g/dL Final  . RDW 08/13/2014 17.1* 11.5 - 15.5 % Final  . Platelets 08/13/2014 192  150 - 400 K/uL Final  . Sodium 08/13/2014 140  135 - 145 mmol/L Final  . Potassium 08/13/2014 3.9  3.5 - 5.1 mmol/L Final  . Chloride 08/13/2014 104  96 - 112 mmol/L Final  . CO2 08/13/2014 30  19 - 32 mmol/L Final  . Glucose, Bld 08/13/2014  88   70 - 99 mg/dL Final  . BUN 08/13/2014 10  6 - 23 mg/dL Final  . Creatinine, Ser 08/13/2014 0.71  0.50 - 1.10 mg/dL Final  . Calcium 08/13/2014 9.3  8.4 - 10.5 mg/dL Final  . Total Protein 08/13/2014 6.2  6.0 - 8.3 g/dL Final  . Albumin 08/13/2014 3.3* 3.5 - 5.2 g/dL Final  . AST 08/13/2014 42* 0 - 37 U/L Final  . ALT 08/13/2014 39* 0 - 35 U/L Final  . Alkaline Phosphatase 08/13/2014 87  39 - 117 U/L Final  . Total Bilirubin 08/13/2014 0.8  0.3 - 1.2 mg/dL Final  . GFR calc non Af Amer 08/13/2014 89* >90 mL/min Final  . GFR calc Af Amer 08/13/2014 >90  >90 mL/min Final   Comment: (NOTE) The eGFR has been calculated using the CKD EPI equation. This calculation has not been validated in all clinical situations. eGFR's persistently <90 mL/min signify possible Chronic Kidney Disease.   . Anion gap 08/13/2014 6  5 - 15 Final  . Prothrombin Time 08/13/2014 14.3  11.6 - 15.2 seconds Final  . INR 08/13/2014 1.10  0.00 - 1.49 Final  . Color, Urine 08/13/2014 YELLOW  YELLOW Final  . APPearance 08/13/2014 CLEAR  CLEAR Final  . Specific Gravity, Urine 08/13/2014 1.005  1.005 - 1.030 Final  . pH 08/13/2014 6.5  5.0 - 8.0 Final  . Glucose, UA 08/13/2014 NEGATIVE  NEGATIVE mg/dL Final  . Hgb urine dipstick 08/13/2014 NEGATIVE  NEGATIVE Final  . Bilirubin Urine 08/13/2014 NEGATIVE  NEGATIVE Final  . Ketones, ur 08/13/2014 NEGATIVE  NEGATIVE mg/dL Final  . Protein, ur 08/13/2014 NEGATIVE  NEGATIVE mg/dL Final  . Urobilinogen, UA 08/13/2014 0.2  0.0 - 1.0 mg/dL Final  . Nitrite 08/13/2014 NEGATIVE  NEGATIVE Final  . Leukocytes, UA 08/13/2014 NEGATIVE  NEGATIVE Final   MICROSCOPIC NOT DONE ON URINES WITH NEGATIVE PROTEIN, BLOOD, LEUKOCYTES, NITRITE, OR GLUCOSE <1000 mg/dL.     X-Rays:No results found.  EKG: Orders placed or performed in visit on 05/20/14  . EKG 12-Lead     Hospital Course: Brandi Rodgers is a 64 y.o. who was admitted to [facility]. They were brought to the  operating room on 08/21/2014 and underwent Procedure(s): RIGHT TOTAL KNEE ARTHROPLASTY.  Patient tolerated the procedure well and was later transferred to the recovery room and then to the orthopaedic floor for postoperative care.  They were given PO and IV analgesics for pain control following their surgery.  They were given 24 hours of postoperative antibiotics of  Anti-infectives    Start     Dose/Rate Route Frequency Ordered Stop   08/21/14 2100  ceFAZolin (ANCEF) IVPB 2 g/50 mL premix     2 g 100 mL/hr over 30 Minutes Intravenous Every 6 hours 08/21/14 1814 08/22/14 0302   08/21/14 1445  ceFAZolin (ANCEF) IVPB 1 g/50 mL premix     1 g 100 mL/hr over 30 Minutes Intravenous  Once 08/21/14 1438 08/21/14 1509   08/21/14 1430  ceFAZolin (ANCEF) IVPB 2 g/50 mL premix     2 g 100 mL/hr over 30 Minutes Intravenous  Once 08/21/14 1427 08/21/14 1509   08/21/14 1430  ceFAZolin (ANCEF) IVPB 1 g/50 mL premix  Status:  Discontinued     1 g 100 mL/hr over 30 Minutes Intravenous 3 times per day 08/21/14 1427 08/21/14 1438   08/21/14 0600  ceFAZolin (ANCEF) 3 g in dextrose 5 % 50 mL IVPB  Status:  Discontinued  3 g 160 mL/hr over 30 Minutes Intravenous On call to O.R. 08/20/14 1410 08/21/14 1427     and started on DVT prophylaxis in the form of Eliquis.   PT and OT were ordered for total joint protocol.  Discharge planning consulted to help with postop disposition and equipment needs.  Patient had a decent night on the evening of surgery.  They started to get up OOB with therapy on day one. Hemovac drain was pulled without difficulty.  Continued to work with therapy into day two.  Dressing was changed on day two and the incision was healing well.  Patient was seen in rounds by Dr. Wynelle Link and wanted to go home.  Discharge home with home health Diet - Cardiac diet Follow up - in 2 weeks Activity - WBAT Disposition - Home Condition Upon Discharge - Good D/C Meds - See DC Summary DVT Prophylaxis -  Eliquis      Discharge Instructions    Call MD / Call 911    Complete by:  As directed   If you experience chest pain or shortness of breath, CALL 911 and be transported to the hospital emergency room.  If you develope a fever above 101 F, pus (white drainage) or increased drainage or redness at the wound, or calf pain, call your surgeon's office.     Increase activity slowly as tolerated    Complete by:  As directed             Medication List    TAKE these medications        acetaminophen 500 MG tablet  Commonly known as:  TYLENOL  Take 1,000 mg by mouth every 6 (six) hours as needed for mild pain.     apixaban 5 MG Tabs tablet  Commonly known as:  ELIQUIS  Take 1 tablet (5 mg total) by mouth 2 (two) times daily.     atenolol 50 MG tablet  Commonly known as:  TENORMIN  Take 0.5 tablets by mouth every morning.     BIOTIN 5000 PO  Take 1 tablet by mouth daily.     bismuth subsalicylate 030 SP/23RA suspension  Commonly known as:  PEPTO BISMOL  Take 30 mLs by mouth as needed.     cyclobenzaprine 5 MG tablet  Commonly known as:  FLEXERIL  Take 1 tablet (5 mg total) by mouth 3 (three) times daily as needed for muscle spasms.     furosemide 20 MG tablet  Commonly known as:  LASIX  Take 20 mg by mouth daily as needed for fluid.     gabapentin 600 MG tablet  Commonly known as:  NEURONTIN  Take 600-1,200 mg by mouth 2 (two) times daily.     multivitamin tablet  Take 1 tablet by mouth daily.     oxyCODONE 5 MG immediate release tablet  Commonly known as:  Oxy IR/ROXICODONE  Take 1-2 tablets (5-10 mg total) by mouth every 3 (three) hours as needed for breakthrough pain.     ranitidine 75 MG tablet  Commonly known as:  ZANTAC  Take 75 mg by mouth 2 (two) times daily as needed for heartburn.     REFRESH OP  Apply 1 drop to eye 2 (two) times daily as needed (for dry eyes).     rizatriptan 10 MG disintegrating tablet  Commonly known as:  MAXALT-MLT  Take 10 mg by  mouth as needed for migraine. May repeat in 2 hours if needed     traMADol  50 MG tablet  Commonly known as:  ULTRAM  Take 1-2 tablets (50-100 mg total) by mouth every 6 (six) hours as needed (mild pain).     VIACTIV PO  Take 1 tablet by mouth daily.       Follow-up Information    Follow up with [name], MD. Schedule an appointment as soon as possible for a visit on 09/03/2014.   Specialty:  Orthopedic Surgery   Why:  Call [phone] tomorrow to make the appointment   Contact information:   [address] [place] [address] [phone]       Follow up with [facility].   Why:  home health PT   Contact information:   [address] [place] [address] [phone]       Signed: [name], PA-C Orthopaedic Surgery 08/27/2014, 9:12 AM

## 2014-09-08 ENCOUNTER — Ambulatory Visit: Payer: BC Managed Care – PPO | Attending: Orthopedic Surgery | Admitting: Physical Therapy

## 2014-09-08 ENCOUNTER — Encounter: Payer: Self-pay | Admitting: Physical Therapy

## 2014-09-08 DIAGNOSIS — M25561 Pain in right knee: Secondary | ICD-10-CM | POA: Diagnosis not present

## 2014-09-08 DIAGNOSIS — R29898 Other symptoms and signs involving the musculoskeletal system: Secondary | ICD-10-CM | POA: Insufficient documentation

## 2014-09-08 DIAGNOSIS — Z96651 Presence of right artificial knee joint: Secondary | ICD-10-CM | POA: Diagnosis not present

## 2014-09-08 DIAGNOSIS — R262 Difficulty in walking, not elsewhere classified: Secondary | ICD-10-CM | POA: Diagnosis not present

## 2014-09-08 DIAGNOSIS — M25661 Stiffness of right knee, not elsewhere classified: Secondary | ICD-10-CM

## 2014-09-08 NOTE — Therapy (Signed)
[address] [place], [place], [address] Phone: [phone]   Fax:  [phone]  Physical Therapy Evaluation  Patient Details  Name: Brandi Rodgers MRN: [id number] Date of Birth: 12-07-1950 Referring Provider:  Gaynelle Arabian, MD  Encounter Date: 09/08/2014      PT End of Session - 09/08/14 1524    Visit Number 1   PT Start Time 0539   PT Stop Time 7673   PT Time Calculation (min) 35 min      Past Medical History  Diagnosis Date  . Paroxysmal a-fib   . Family history of adverse reaction to anesthesia     son has had nausea and vomiting   . Dysrhythmia     paroxsymal A Fib   . Sleep apnea     uses mouth guard  . Pneumonia     hx of   . Bronchitis     hx of   . GERD (gastroesophageal reflux disease)   . Headache     hx of migraines  . Cancer     hx of skin cancers upper arms bilat   . Chronic cystitis   . Shingles     hx of   . Hypercholesterolemia   . Varicose veins     hx of   . Urinary tract infection     hx of   . Measles     hx of   . Mumps     hx of   . Degenerative lumbar disc     hx of   . Osteoarthritis     left knee  . Chronic low back pain   . Hemorrhoids     hx of     Past Surgical History  Procedure Laterality Date  . Knee surgery      arthroscopy right knee 03/2003 left knee 11/2003 and 07/2007  . Hand surgery      carpal tunnel right hand 04/2003  . Colonscopy    . Dilation and curettage of uterus      hx of in 1992-93  . Leg circulation surgery       varicose veins / laser surgery   . Total knee arthroplasty Left 03/23/2014    Procedure: LEFT TOTAL KNEE ARTHROPLASTY ;  Surgeon: [name], MD;  Location: [facility];  Service: Orthopedics;  Laterality: Left;  . Joint replacement    . Total knee arthroplasty Right 08/21/2014    Procedure: RIGHT TOTAL KNEE ARTHROPLASTY;  Surgeon: Gaynelle Arabian, MD;  Location: [facility];  Service: Orthopedics;  Laterality: Right;     There were no vitals filed for this visit.  Visit Diagnosis:  Right knee pain - Plan: PT plan of care cert/re-cert  Difficulty walking - Plan: PT plan of care cert/re-cert  Decreased ROM of right knee - Plan: PT plan of care cert/re-cert      Subjective Assessment - 09/08/14 1457    Subjective Patient underwent a right TKR on 08/21/14.  Had home PT until last week.   Pertinent History left TKR 03/2014   Limitations Standing;Walking;House hold activities   Patient Stated Goals walk, without pain, I would like to get up from chair without using arms   Currently in Pain? Yes   Pain Score 1    Pain Location Knee   Pain Orientation Right   Pain Descriptors / Indicators Dull   Pain Type Surgical pain   Pain Onset 1 to 4 weeks  ago   Aggravating Factors  pain meds and rest   Pain Relieving Factors being up on the leg   Effect of Pain on Daily Activities limits ADL's            [facility] PT Assessment - 09/08/14 0001    Assessment   Medical Diagnosis s/p right TKR   Onset Date 08/21/14   Prior Therapy Home PT   Restrictions   Weight Bearing Restrictions No   Balance Screen   Has the patient fallen in the past 6 months No   Has the patient had a decrease in activity level because of a fear of falling?  No   Is the patient reluctant to leave their home because of a fear of falling?  No   Home Environment   Additional Comments few steps into the home   Prior Function   Level of Independence Independent with basic ADLs;Independent with homemaking with ambulation   Vocation Retired   AROM   Overall AROM Comments at edge of bed  10-90 degrees flexion   PROM   Overall PROM Comments 10-92 degrees flexion    Strength   Overall Strength Comments 4/5   Palpation   Palpation scar is puckered and tight, mild warmth, not tender   Ambulation/Gait   Gait Comments gait is with SPC, slow, antalgic ont he right                           PT Education - 09/08/14 1506     Education provided Yes   Education Details RICE, low load long duration stretches   Person(s) Educated Patient   Methods Explanation;Demonstration;Handout   Comprehension Verbalized understanding;Returned demonstration          PT Short Term Goals - 09/08/14 1526    PT SHORT TERM GOAL #1   Title independent with initial HEP   Time 1   Period Weeks   Status New           PT Long Term Goals - 09/08/14 1527    PT LONG TERM GOAL #1   Title increase AROM of the right knee to 5-110 degrees flexion   Time 8   Period Weeks   Status New   PT LONG TERM GOAL #2   Title no pain with normal ADL's   Time 8   Period Weeks   Status New   PT LONG TERM GOAL #3   Title walk without SPC for most distances   Time 8   Period Weeks   Status New   PT LONG TERM GOAL #4   Title ascend/descend stairs step over step   Time 8   Period Weeks   Status New               Plan - 09/08/14 1525    Clinical Impression Statement very stiff knee with some rigidity of the scar, gait is very good with a cane. S/P right TKR on 08/21/14   Pt will benefit from skilled therapeutic intervention in order to improve on the following deficits Abnormal gait;Increased edema;Impaired flexibility;Pain;Decreased endurance;Decreased range of motion;Decreased strength;Difficulty walking;Decreased scar mobility   Rehab Potential Good   PT Frequency 2x / week   PT Duration 4 weeks   PT Treatment/Interventions Cryotherapy;Electrical Stimulation;Gait training;Stair training;Functional mobility training;Therapeutic activities;Therapeutic exercise;Patient/family education   PT Next Visit Plan add exercises         Problem List Patient Active Problem List  Diagnosis Date Noted  . OA (osteoarthritis) of knee 03/23/2014  . Paroxysmal atrial fibrillation 03/06/2014  . Essential hypertension 03/06/2014  . Hypercholesterolemia 03/06/2014  . Osteoarthritis 03/06/2014  . Migraine headache 03/06/2014     Sumner Boast, PT 09/08/2014, 3:33 PM  [address] [place], [place], [address] Phone: [phone]   Fax:  [phone]

## 2014-09-10 ENCOUNTER — Ambulatory Visit: Payer: BC Managed Care – PPO | Admitting: Physical Therapy

## 2014-09-10 ENCOUNTER — Encounter: Payer: Self-pay | Admitting: Physical Therapy

## 2014-09-10 DIAGNOSIS — M25561 Pain in right knee: Secondary | ICD-10-CM | POA: Diagnosis not present

## 2014-09-10 DIAGNOSIS — M25661 Stiffness of right knee, not elsewhere classified: Secondary | ICD-10-CM

## 2014-09-10 NOTE — Therapy (Signed)
[place] [address], [place], [address] Phone: [phone]   Fax:  [phone]  Physical Therapy Treatment  Patient Details  Name: Brandi Rodgers MRN: [id number] Date of Birth: [birth year] Referring Provider:  Gaynelle Arabian, MD  Encounter Date: 09/10/2014      PT End of Session - 09/10/14 1229    Visit Number 2   PT Start Time 3875   PT Stop Time 1245   PT Time Calculation (min) 60 min      Past Medical History  Diagnosis Date  . Paroxysmal a-fib   . Family history of adverse reaction to anesthesia     son has had nausea and vomiting   . Dysrhythmia     paroxsymal A Fib   . Sleep apnea     uses mouth guard  . Pneumonia     hx of   . Bronchitis     hx of   . GERD (gastroesophageal reflux disease)   . Headache     hx of migraines  . Cancer     hx of skin cancers upper arms bilat   . Chronic cystitis   . Shingles     hx of   . Hypercholesterolemia   . Varicose veins     hx of   . Urinary tract infection     hx of   . Measles     hx of   . Mumps     hx of   . Degenerative lumbar disc     hx of   . Osteoarthritis     left knee  . Chronic low back pain   . Hemorrhoids     hx of     Past Surgical History  Procedure Laterality Date  . Knee surgery      arthroscopy right knee 03/2003 left knee 11/2003 and 07/2007  . Hand surgery      carpal tunnel right hand 04/2003  . Colonscopy    . Dilation and curettage of uterus      hx of in 1992-93  . Leg circulation surgery       varicose veins / laser surgery   . Total knee arthroplasty Left 03/23/2014    Procedure: LEFT TOTAL KNEE ARTHROPLASTY ;  Surgeon: [name], MD;  Location: [facility];  Service: Orthopedics;  Laterality: Left;  . Joint replacement    . Total knee arthroplasty Right 08/21/2014    Procedure: RIGHT TOTAL KNEE ARTHROPLASTY;  Surgeon: Gaynelle Arabian, MD;  Location: [facility];  Service: Orthopedics;  Laterality: Right;     There were no vitals filed for this visit.  Visit Diagnosis:  Right knee pain  Decreased ROM of right knee      Subjective Assessment - 09/10/14 1143    Subjective stiff and sore, been driving past few days and thatis a strain. Doing HEP and no issues.   Currently in Pain? Yes   Pain Score 2    Pain Location Knee   Pain Orientation Right   Pain Descriptors / Indicators Aching   Pain Type Surgical pain                         [facility] Adult PT Treatment/Exercise - 09/10/14 0001    Knee/Hip Exercises: Aerobic   Stationary Bike 6 min full revolutions   Elliptical Nustep 5 6 min   Modalities   Modalities Electrical Stimulation  ice   Acupuncturist Location RT knee   Electrical Stimulation Action IFC   Electrical Stimulation Goals Pain   Manual Therapy   Manual Therapy Massage;Scapular mobilization;Passive ROM   Knee/Hip Exercises: Machines for Strengthening   Cybex Knee Extension 10# 3 sets 10  overpressure to increase flexion   Cybex Knee Flexion 20# 3 sets 10   Cybex Leg Press 20# 2sets10 , 10 reps no weight fo rincreased flexion  PTA overpressure fo rincreased ROM into flexion made to #5                  PT Short Term Goals - 09/10/14 1230    PT SHORT TERM GOAL #1   Title independent with initial HEP   Status Achieved           PT Long Term Goals - 09/08/14 1527    PT LONG TERM GOAL #1   Title increase AROM of the right knee to 5-110 degrees flexion   Time 8   Period Weeks   Status New   PT LONG TERM GOAL #2   Title no pain with normal ADL's   Time 8   Period Weeks   Status New   PT LONG TERM GOAL #3   Title walk without SPC for most distances   Time 8   Period Weeks   Status New   PT LONG TERM GOAL #4   Title ascend/descend stairs step over step   Time 8   Period Weeks   Status New               Plan - 09/10/14 1229    Clinical Impression Statement pt with improved  functional flexion and tolerated ther ex well, tight and rigid scar esp distally, tolerated STW/CFM well        Problem List Patient Active Problem List   Diagnosis Date Noted  . OA (osteoarthritis) of knee 03/23/2014  . Paroxysmal atrial fibrillation 03/06/2014  . Essential hypertension 03/06/2014  . Hypercholesterolemia 03/06/2014  . Osteoarthritis 03/06/2014  . Migraine headache 03/06/2014    PAYSEUR,ANGIE PTA 09/10/2014, 12:31 PM  [address] [place], [place], [address] Phone: [phone]   Fax:  [phone]

## 2014-09-14 ENCOUNTER — Ambulatory Visit: Payer: BC Managed Care – PPO | Attending: Orthopedic Surgery | Admitting: Physical Therapy

## 2014-09-14 DIAGNOSIS — R29898 Other symptoms and signs involving the musculoskeletal system: Secondary | ICD-10-CM | POA: Diagnosis not present

## 2014-09-14 DIAGNOSIS — M25661 Stiffness of right knee, not elsewhere classified: Secondary | ICD-10-CM

## 2014-09-14 DIAGNOSIS — Z96651 Presence of right artificial knee joint: Secondary | ICD-10-CM | POA: Insufficient documentation

## 2014-09-14 DIAGNOSIS — R262 Difficulty in walking, not elsewhere classified: Secondary | ICD-10-CM | POA: Diagnosis not present

## 2014-09-14 DIAGNOSIS — M25561 Pain in right knee: Secondary | ICD-10-CM

## 2014-09-14 NOTE — Therapy (Signed)
[place] [address] [place], [place], [address] Phone: [phone]   Fax:  [phone]  Physical Therapy Treatment  Patient Details  Name: Brandi Rodgers MRN: [id number] Date of Birth: [birth year] Referring Provider:  Gaynelle Arabian, MD  Encounter Date: 09/14/2014      PT End of Session - 09/14/14 1452    Visit Number 3   PT Start Time 1400   PT Stop Time 0160   PT Time Calculation (min) 57 min   Activity Tolerance Patient tolerated treatment well   Behavior During Therapy [facility] for tasks assessed/performed      Past Medical History  Diagnosis Date  . Paroxysmal a-fib   . Family history of adverse reaction to anesthesia     son has had nausea and vomiting   . Dysrhythmia     paroxsymal A Fib   . Sleep apnea     uses mouth guard  . Pneumonia     hx of   . Bronchitis     hx of   . GERD (gastroesophageal reflux disease)   . Headache     hx of migraines  . Cancer     hx of skin cancers upper arms bilat   . Chronic cystitis   . Shingles     hx of   . Hypercholesterolemia   . Varicose veins     hx of   . Urinary tract infection     hx of   . Measles     hx of   . Mumps     hx of   . Degenerative lumbar disc     hx of   . Osteoarthritis     left knee  . Chronic low back pain   . Hemorrhoids     hx of     Past Surgical History  Procedure Laterality Date  . Knee surgery      arthroscopy right knee 03/2003 left knee 11/2003 and 07/2007  . Hand surgery      carpal tunnel right hand 04/2003  . Colonscopy    . Dilation and curettage of uterus      hx of in 1992-93  . Leg circulation surgery       varicose veins / laser surgery   . Total knee arthroplasty Left 03/23/2014    Procedure: LEFT TOTAL KNEE ARTHROPLASTY ;  Surgeon: [name], MD;  Location: [facility];  Service: Orthopedics;  Laterality: Left;  . Joint replacement    . Total knee arthroplasty Right 08/21/2014    Procedure: RIGHT  TOTAL KNEE ARTHROPLASTY;  Surgeon: Gaynelle Arabian, MD;  Location: [facility];  Service: Orthopedics;  Laterality: Right;    There were no vitals filed for this visit.  Visit Diagnosis:  Right knee pain  Decreased ROM of right knee  Difficulty walking      Subjective Assessment - 09/14/14 1356    Subjective R knee is a little stiff today; walked around [employer] yesterday.  Having some cramping in hamstrings and calf (hx of cramps).     Pertinent History left TKR 03/2014   Limitations Standing;Walking;House hold activities   Patient Stated Goals walk, without pain, I would like to get up from chair without using arms   Currently in Pain? Yes   Pain Score 2   without pain medicine   Pain Location Knee   Pain Orientation Right   Pain Descriptors / Indicators Aching   Pain  Type Surgical pain   Pain Onset 1 to 4 weeks ago   Aggravating Factors  weight bearing; walking   Pain Relieving Factors pain meds and rest                         [facility] Adult PT Treatment/Exercise - 09/14/14 1405    Knee/Hip Exercises: Aerobic   Stationary Bike seat 8 partial revolutions x 6 min   Elliptical Nustep L6 x 6 min   Knee/Hip Exercises: Machines for Strengthening   Cybex Knee Extension 10# 3 sets 10 with overpressure between sets for ROM   Cybex Knee Flexion 20# 3 sets 10   Modalities   Modalities Cryotherapy;Electrical Stimulation   Cryotherapy   Number Minutes Cryotherapy 15 Minutes   Cryotherapy Location Knee   Type of Cryotherapy Ice pack   Electrical Stimulation   Electrical Stimulation Location RT knee   Electrical Stimulation Action IFC   Electrical Stimulation Parameters to tolerance x 15 min   Electrical Stimulation Goals Pain   Manual Therapy   Manual Therapy Passive ROM   Passive ROM R knee flexion/extension; patella mobs                  PT Short Term Goals - 09/10/14 1230    PT SHORT TERM GOAL #1   Title independent with initial HEP   Status  Achieved           PT Long Term Goals - 09/08/14 1527    PT LONG TERM GOAL #1   Title increase AROM of the right knee to 5-110 degrees flexion   Time 8   Period Weeks   Status New   PT LONG TERM GOAL #2   Title no pain with normal ADL's   Time 8   Period Weeks   Status New   PT LONG TERM GOAL #3   Title walk without SPC for most distances   Time 8   Period Weeks   Status New   PT LONG TERM GOAL #4   Title ascend/descend stairs step over step   Time 8   Period Weeks   Status New               Plan - 09/14/14 1452    Clinical Impression Statement Pt tolerates manual therapy well for ROM; progressing well towards goals and functional mobility.     PT Next Visit Plan add exercises; measure; continue strengthening and ROM   Consulted and Agree with Plan of Care Patient        Problem List Patient Active Problem List   Diagnosis Date Noted  . OA (osteoarthritis) of knee 03/23/2014  . Paroxysmal atrial fibrillation 03/06/2014  . Essential hypertension 03/06/2014  . Hypercholesterolemia 03/06/2014  . Osteoarthritis 03/06/2014  . Migraine headache 03/06/2014   Laureen Abrahams, PT, DPT 09/14/2014 3:03 PM  [place] [address] [place], [place], [address] Phone: [phone]   Fax:  [phone]

## 2014-09-16 ENCOUNTER — Ambulatory Visit: Payer: BC Managed Care – PPO | Admitting: Physical Therapy

## 2014-09-16 DIAGNOSIS — M25661 Stiffness of right knee, not elsewhere classified: Secondary | ICD-10-CM

## 2014-09-16 DIAGNOSIS — M25561 Pain in right knee: Secondary | ICD-10-CM | POA: Diagnosis not present

## 2014-09-16 DIAGNOSIS — R262 Difficulty in walking, not elsewhere classified: Secondary | ICD-10-CM

## 2014-09-16 NOTE — Therapy (Signed)
[place] [address] [place], [place], [address] Phone: [phone]   Fax:  [phone]  Physical Therapy Treatment  Patient Details  Name: Brandi Rodgers MRN: [id number] Date of Birth: [birth year] Referring Provider:  Gaynelle Arabian, MD  Encounter Date: 09/16/2014      PT End of Session - 09/16/14 1439    Visit Number 4   PT Start Time 1400   PT Stop Time 7672   PT Time Calculation (min) 56 min   Activity Tolerance Patient tolerated treatment well   Behavior During Therapy [facility] for tasks assessed/performed      Past Medical History  Diagnosis Date  . Paroxysmal a-fib   . Family history of adverse reaction to anesthesia     son has had nausea and vomiting   . Dysrhythmia     paroxsymal A Fib   . Sleep apnea     uses mouth guard  . Pneumonia     hx of   . Bronchitis     hx of   . GERD (gastroesophageal reflux disease)   . Headache     hx of migraines  . Cancer     hx of skin cancers upper arms bilat   . Chronic cystitis   . Shingles     hx of   . Hypercholesterolemia   . Varicose veins     hx of   . Urinary tract infection     hx of   . Measles     hx of   . Mumps     hx of   . Degenerative lumbar disc     hx of   . Osteoarthritis     left knee  . Chronic low back pain   . Hemorrhoids     hx of     Past Surgical History  Procedure Laterality Date  . Knee surgery      arthroscopy right knee 03/2003 left knee 11/2003 and 07/2007  . Hand surgery      carpal tunnel right hand 04/2003  . Colonscopy    . Dilation and curettage of uterus      hx of in 1992-93  . Leg circulation surgery       varicose veins / laser surgery   . Total knee arthroplasty Left 03/23/2014    Procedure: LEFT TOTAL KNEE ARTHROPLASTY ;  Surgeon: [name], MD;  Location: [facility];  Service: Orthopedics;  Laterality: Left;  . Joint replacement    . Total knee arthroplasty Right 08/21/2014    Procedure: RIGHT  TOTAL KNEE ARTHROPLASTY;  Surgeon: Gaynelle Arabian, MD;  Location: [facility];  Service: Orthopedics;  Laterality: Right;    There were no vitals filed for this visit.  Visit Diagnosis:  Right knee pain  Decreased ROM of right knee  Difficulty walking      Subjective Assessment - 09/16/14 1401    Subjective slept in bed whole night for first time since surgery; R knee feels good, a little stiff   Pertinent History left TKR 03/2014   Patient Stated Goals walk, without pain, I would like to get up from chair without using arms   Currently in Pain? Yes   Pain Score 1    Pain Location Knee   Pain Orientation Right   Pain Descriptors / Indicators Aching   Pain Type Surgical pain   Pain Onset 1 to 4 weeks ago  [facility] PT Assessment - 09/16/14 1434    AROM   Overall AROM Comments at edge of bed  9-100 degrees flexion   PROM   Overall PROM Comments 5-105                     [facility] Adult PT Treatment/Exercise - 09/16/14 1403    Knee/Hip Exercises: Aerobic   Stationary Bike seat 8 partial revolutions x 6 min   Elliptical Nustep L6 x 6 min   Knee/Hip Exercises: Machines for Strengthening   Cybex Knee Extension 5# (mostly only using RLE) 3 sets 10 with overpressure between sets for ROM   Cybex Knee Flexion 20# 3 sets 10   Cybex Leg Press 30# 2x10; 10 reps no weight for ROM   Cryotherapy   Number Minutes Cryotherapy 15 Minutes   Cryotherapy Location Knee   Type of Cryotherapy Ice pack   Electrical Stimulation   Electrical Stimulation Location RT knee   Electrical Stimulation Action IFC   Electrical Stimulation Parameters to tolerance x 15 min   Electrical Stimulation Goals Pain   Manual Therapy   Manual Therapy Passive ROM   Passive ROM R knee flexion/extension; patella mobs                  PT Short Term Goals - 09/10/14 1230    PT SHORT TERM GOAL #1   Title independent with initial HEP   Status Achieved           PT Long Term Goals -  09/08/14 1527    PT LONG TERM GOAL #1   Title increase AROM of the right knee to 5-110 degrees flexion   Time 8   Period Weeks   Status New   PT LONG TERM GOAL #2   Title no pain with normal ADL's   Time 8   Period Weeks   Status New   PT LONG TERM GOAL #3   Title walk without SPC for most distances   Time 8   Period Weeks   Status New   PT LONG TERM GOAL #4   Title ascend/descend stairs step over step   Time 8   Period Weeks   Status New               Plan - 09/16/14 1439    Clinical Impression Statement ROM improving and pt able to perform full revolutions at seat 8 on bike today.     PT Next Visit Plan add to HEP; continue strengthening and ROM   Consulted and Agree with Plan of Care Patient        Problem List Patient Active Problem List   Diagnosis Date Noted  . OA (osteoarthritis) of knee 03/23/2014  . Paroxysmal atrial fibrillation 03/06/2014  . Essential hypertension 03/06/2014  . Hypercholesterolemia 03/06/2014  . Osteoarthritis 03/06/2014  . Migraine headache 03/06/2014   Laureen Abrahams, PT, DPT 09/16/2014 3:18 PM  [place] [address] [place], [place], [address] Phone: [phone]   Fax:  [phone]

## 2014-09-21 ENCOUNTER — Encounter: Payer: Self-pay | Admitting: Physical Therapy

## 2014-09-21 ENCOUNTER — Ambulatory Visit: Payer: BC Managed Care – PPO | Admitting: Physical Therapy

## 2014-09-21 DIAGNOSIS — M25561 Pain in right knee: Secondary | ICD-10-CM

## 2014-09-21 DIAGNOSIS — R262 Difficulty in walking, not elsewhere classified: Secondary | ICD-10-CM

## 2014-09-21 DIAGNOSIS — M25661 Stiffness of right knee, not elsewhere classified: Secondary | ICD-10-CM

## 2014-09-21 NOTE — Therapy (Signed)
[place], [place], [address] Phone: [phone]   Fax:  [phone]  Physical Therapy Treatment  Patient Details  Name: ALAYJAH BOEHRINGER MRN: [id number] Date of Birth: Feb 23, 1951 Referring Provider:  Gaynelle Arabian, MD  Encounter Date: 09/21/2014      PT End of Session - 09/21/14 1534    Visit Number 5   PT Start Time 7253   PT Stop Time 1550   PT Time Calculation (min) 66 min      Past Medical History  Diagnosis Date  . Paroxysmal a-fib   . Family history of adverse reaction to anesthesia     son has had nausea and vomiting   . Dysrhythmia     paroxsymal A Fib   . Sleep apnea     uses mouth guard  . Pneumonia     hx of   . Bronchitis     hx of   . GERD (gastroesophageal reflux disease)   . Headache     hx of migraines  . Cancer     hx of skin cancers upper arms bilat   . Chronic cystitis   . Shingles     hx of   . Hypercholesterolemia   . Varicose veins     hx of   . Urinary tract infection     hx of   . Measles     hx of   . Mumps     hx of   . Degenerative lumbar disc     hx of   . Osteoarthritis     left knee  . Chronic low back pain   . Hemorrhoids     hx of     Past Surgical History  Procedure Laterality Date  . Knee surgery      arthroscopy right knee 03/2003 left knee 11/2003 and 07/2007  . Hand surgery      carpal tunnel right hand 04/2003  . Colonscopy    . Dilation and curettage of uterus      hx of in 1992-93  . Leg circulation surgery       varicose veins / laser surgery   . Total knee arthroplasty Left 03/23/2014    Procedure: LEFT TOTAL KNEE ARTHROPLASTY ;  Surgeon: [name], MD;  Location: [facility];  Service: Orthopedics;  Laterality: Left;  . Joint replacement    . Total knee arthroplasty Right 08/21/2014    Procedure: RIGHT TOTAL KNEE ARTHROPLASTY;  Surgeon: Gaynelle Arabian, MD;  Location: [facility];  Service: Orthopedics;  Laterality: Right;     There were no vitals filed for this visit.  Visit Diagnosis:  Right knee pain  Decreased ROM of right knee  Difficulty walking      Subjective Assessment - 09/21/14 1445    Subjective I am a little more sore today, did a lot of traveling over the weekend.   Currently in Pain? Yes   Pain Score 2    Pain Location Knee   Pain Orientation Right   Pain Descriptors / Indicators Aching   Pain Type Surgical pain   Aggravating Factors  being up on it more   Pain Relieving Factors rest                         [facility] Adult PT Treatment/Exercise - 09/21/14 0001    Ambulation/Gait   Gait Comments gait step over step on  stairs, outside with no assistive device 300 feet   Knee/Hip Exercises: Stretches   Gastroc Stretch 3 reps;20 seconds   Knee/Hip Exercises: Aerobic   Stationary Bike seat 8 full revolutions x 6 min   Elliptical Nustep L6 x 6 min   Knee/Hip Exercises: Machines for Strengthening   Cybex Knee Extension 5# (mostly only using RLE) 3 sets 10 with overpressure between sets for ROM   Cybex Knee Flexion 20# 3 sets 10   Cybex Leg Press 30# 2x10; 10 reps no weight for ROM   Cryotherapy   Number Minutes Cryotherapy 15 Minutes   Cryotherapy Location Knee   Type of Cryotherapy Ice pack   Electrical Stimulation   Electrical Stimulation Location RT knee   Electrical Stimulation Action IFC   Electrical Stimulation Parameters tolerance   Electrical Stimulation Goals Pain   Manual Therapy   Manual Therapy Passive ROM   Passive ROM R knee flexion/extension; patella mobs                  PT Short Term Goals - 09/10/14 1230    PT SHORT TERM GOAL #1   Title independent with initial HEP   Status Achieved           PT Long Term Goals - 09/08/14 1527    PT LONG TERM GOAL #1   Title increase AROM of the right knee to 5-110 degrees flexion   Time 8   Period Weeks   Status New   PT LONG TERM GOAL #2   Title no pain with normal ADL's   Time 8    Period Weeks   Status New   PT LONG TERM GOAL #3   Title walk without SPC for most distances   Time 8   Period Weeks   Status New   PT LONG TERM GOAL #4   Title ascend/descend stairs step over step   Time 8   Period Weeks   Status New               Plan - 09/21/14 1535    Clinical Impression Statement stiff knee after walking a lot this weekend.  Slow first few steps.  Stiff but better motions.   PT Next Visit Plan continue to wokr on ROM, strength and function   Consulted and Agree with Plan of Care Patient        Problem List Patient Active Problem List   Diagnosis Date Noted  . OA (osteoarthritis) of knee 03/23/2014  . Paroxysmal atrial fibrillation 03/06/2014  . Essential hypertension 03/06/2014  . Hypercholesterolemia 03/06/2014  . Osteoarthritis 03/06/2014  . Migraine headache 03/06/2014    Sumner Boast, PT 09/21/2014, 3:37 PM  [address] [address] [place], [place], [address] Phone: [phone]   Fax:  [phone]

## 2014-09-24 ENCOUNTER — Ambulatory Visit: Payer: BC Managed Care – PPO | Admitting: Physical Therapy

## 2014-09-29 ENCOUNTER — Encounter: Payer: Self-pay | Admitting: Physical Therapy

## 2014-09-29 ENCOUNTER — Ambulatory Visit: Payer: BC Managed Care – PPO | Admitting: Physical Therapy

## 2014-09-29 DIAGNOSIS — M25661 Stiffness of right knee, not elsewhere classified: Secondary | ICD-10-CM

## 2014-09-29 DIAGNOSIS — M25561 Pain in right knee: Secondary | ICD-10-CM | POA: Diagnosis not present

## 2014-09-29 NOTE — Therapy (Signed)
[place], [place], [address] Phone: [phone]   Fax:  [phone]  Physical Therapy Treatment  Patient Details  Name: Brandi Rodgers MRN: [id number] Date of Birth: [birth year] Referring Provider:  Gaynelle Arabian, MD  Encounter Date: 09/29/2014      PT End of Session - 09/29/14 1448    Visit Number 6   PT Start Time 1400   PT Stop Time 1505   PT Time Calculation (min) 65 min      Past Medical History  Diagnosis Date  . Paroxysmal a-fib   . Family history of adverse reaction to anesthesia     son has had nausea and vomiting   . Dysrhythmia     paroxsymal A Fib   . Sleep apnea     uses mouth guard  . Pneumonia     hx of   . Bronchitis     hx of   . GERD (gastroesophageal reflux disease)   . Headache     hx of migraines  . Cancer     hx of skin cancers upper arms bilat   . Chronic cystitis   . Shingles     hx of   . Hypercholesterolemia   . Varicose veins     hx of   . Urinary tract infection     hx of   . Measles     hx of   . Mumps     hx of   . Degenerative lumbar disc     hx of   . Osteoarthritis     left knee  . Chronic low back pain   . Hemorrhoids     hx of     Past Surgical History  Procedure Laterality Date  . Knee surgery      arthroscopy right knee 03/2003 left knee 11/2003 and 07/2007  . Hand surgery      carpal tunnel right hand 04/2003  . Colonscopy    . Dilation and curettage of uterus      hx of in 1992-93  . Leg circulation surgery       varicose veins / laser surgery   . Total knee arthroplasty Left 03/23/2014    Procedure: LEFT TOTAL KNEE ARTHROPLASTY ;  Surgeon: [name], MD;  Location: [facility];  Service: Orthopedics;  Laterality: Left;  . Joint replacement    . Total knee arthroplasty Right 08/21/2014    Procedure: RIGHT TOTAL KNEE ARTHROPLASTY;  Surgeon: Gaynelle Arabian, MD;  Location: [facility];  Service: Orthopedics;  Laterality: Right;     There were no vitals filed for this visit.  Visit Diagnosis:  Right knee pain  Decreased ROM of right knee      Subjective Assessment - 09/29/14 1412    Currently in Pain? Yes   Pain Score 2    Pain Location Knee   Pain Orientation Right            [facility] PT Assessment - 09/29/14 0001    AROM   Overall AROM Comments EOB   1-118   Strength   Overall Strength Comments 5/5                     [facility] Adult PT Treatment/Exercise - 09/29/14 0001    Knee/Hip Exercises: Aerobic   Stationary Bike seat 8 full revolutions x 6 min   Elliptical 49fd/2 back I 5 R 4  Knee/Hip Exercises: Machines for Strengthening   Cybex Knee Extension 5# (mostly only using RLE) 3 sets 10 with overpressure between sets for ROM   Cybex Knee Flexion 25# 3 sets 10   Cybex Leg Press 30# 3x10;   calf raises 30# 2 sets 10   Knee/Hip Exercises: Standing   Other Standing Knee Exercises bilateral hip flex,ext and abd on airex 10 reps each red tbnad   Knee/Hip Exercises: Seated   Long Arc Quad Strengthening;2 sets;10 reps  3#   Cryotherapy   Number Minutes Cryotherapy 15 Minutes   Cryotherapy Location Knee   Type of Cryotherapy Ice pack   Electrical Stimulation   Electrical Stimulation Location RT knee   Electrical Stimulation Action IFC   Electrical Stimulation Goals Edema;Pain                PT Education - 09/29/14 1443    Education Details Bike, Stretches and HEP   Person(s) Educated Patient   Methods Explanation;Demonstration   Comprehension Verbalized understanding;Returned demonstration          PT Short Term Goals - 09/10/14 1230    PT SHORT TERM GOAL #1   Title independent with initial HEP   Status Achieved           PT Long Term Goals - 09/29/14 1443    PT LONG TERM GOAL #1   Title increase AROM of the right knee to 5-110 degrees flexion   Status Achieved   PT LONG TERM GOAL #2   Title no pain with normal ADL's   Status Achieved   PT LONG TERM  GOAL #3   Title walk without SPC for most distances   Status Achieved   PT LONG TERM GOAL #4   Title ascend/descend stairs step over step   Status Achieved               Plan - 09/29/14 1444    Clinical Impression Statement tolerated treatment well, amb without AD, ROM and strength WFLs . All goals met.   PT Next Visit Plan D/C        Problem List Patient Active Problem List   Diagnosis Date Noted  . OA (osteoarthritis) of knee 03/23/2014  . Paroxysmal atrial fibrillation 03/06/2014  . Essential hypertension 03/06/2014  . Hypercholesterolemia 03/06/2014  . Osteoarthritis 03/06/2014  . Migraine headache 03/06/2014    Bryton Romagnoli,ANGIE PTA Lum Babe PT 09/29/2014, 2:49 PM  [address] [place], [place], [address] Phone: [phone]   Fax:  [phone]

## 2014-09-30 ENCOUNTER — Other Ambulatory Visit: Payer: Self-pay | Admitting: Cardiology

## 2014-10-30 ENCOUNTER — Telehealth: Payer: Self-pay | Admitting: Cardiology

## 2014-10-30 NOTE — Telephone Encounter (Signed)
LM w/Deanna at Dr. Nelva Bush office that Dr. Mare Ferrari will be back in office on Monday 6/20 and will forward message to him for clearance.

## 2014-10-30 NOTE — Telephone Encounter (Signed)
Request for surgical clearance:  1. What type of surgery is being performed? R L4-5 and L5-S1 Injection   2. When is this surgery scheduled? 7/12   3. Are there any medications that need to be held prior to surgery and how long?Requesting pt hold Eliquis 3 days prior   4. Name of physician performing surgery? Dr. Nelva Bush   5. What is your office phone and fax number?  [place]   Phone: [phone] Fax: [phone]

## 2014-11-01 NOTE — Telephone Encounter (Signed)
Okay for surgery from cardiac standpoint.  Okay to hold Eliquis for 3 days before surgery.

## 2014-11-02 NOTE — Telephone Encounter (Signed)
Faxed and received confirmation

## 2014-11-17 ENCOUNTER — Telehealth: Payer: Self-pay | Admitting: *Deleted

## 2014-11-17 NOTE — Telephone Encounter (Signed)
UNABLE TO REACH PT TO GET FAMILY HX/STATUS

## 2014-11-19 ENCOUNTER — Ambulatory Visit (INDEPENDENT_AMBULATORY_CARE_PROVIDER_SITE_OTHER): Payer: BC Managed Care – PPO | Admitting: Cardiology

## 2014-11-19 VITALS — BP 138/85 | HR 67 | Ht 68.0 in | Wt 274.0 lb

## 2014-11-19 DIAGNOSIS — I480 Paroxysmal atrial fibrillation: Secondary | ICD-10-CM | POA: Diagnosis not present

## 2014-11-19 DIAGNOSIS — M150 Primary generalized (osteo)arthritis: Secondary | ICD-10-CM | POA: Diagnosis not present

## 2014-11-19 DIAGNOSIS — M159 Polyosteoarthritis, unspecified: Secondary | ICD-10-CM

## 2014-11-19 DIAGNOSIS — I10 Essential (primary) hypertension: Secondary | ICD-10-CM | POA: Diagnosis not present

## 2014-11-19 DIAGNOSIS — M8949 Other hypertrophic osteoarthropathy, multiple sites: Secondary | ICD-10-CM

## 2014-11-19 NOTE — Progress Notes (Signed)
Cardiology Office Note   Date:  11/19/2014   ID:  SUMMERS BUENDIA, DOB Feb 11, 1951, MRN [id number]  PCP:  Brandi Coma, Rodgers  Cardiologist: Brandi Rodgers  No chief complaint on file.     History of Present Illness: Brandi Rodgers is a 64 y.o. female who presents for scheduled 6 month follow-up office visit  This pleasant 64 year old woman is seen for a follow-up office visit.[name] She was initially seen several months ago at the request of Dr. Jonathon Rodgers for evaluation of paroxysmal atrial fibrillation. The patient has a history of intermittent episodes of rapid irregular heart rate dating back 10 years. The episodes occur infrequently. On 01/29/14 she was seen at her PCP office and had documented atrial fibrillation with a ventricular rate of 111. I reviewed the electrocardiogram and concur with the interpretation. That was her last episode of documented atrial fibrillation. Normally she has about 1 episode or less per month. Each episode typically lasts several hours.  She has had occasional brief episodes of fluttering.  Normally she can attribute it to consuming too much caffeine. The patient does not have any history of ischemic heart disease. She does not have any history of exertional chest pain. She has a history of occasional ankle edema.  She has not had to take any recent Lasix. The patient has a history of migraine headaches and is followed at the headache clinic. She is on gabapentin for prevention of headaches. The patient has a history of osteoarthritis of the knees and underwent a left total knee replacement by Dr. Wynelle Rodgers on March 23, 2014. She underwent right total knee replacement in April 2016.  She is doing well following her knee surgeries. She has a history of mild hypercholesterolemia. She is not presently on any statins. Her lipids are followed by her PCP The patient is on Apixaban for her paroxysmal atrial fibrillation.  She has  occasional blood in her stool from hemorrhoids.  She is up-to-date on her colonoscopies. She has a history of sleep apnea and she wears a mouth guard.  Past Medical History  Diagnosis Date  . Paroxysmal a-fib   . Family history of adverse reaction to anesthesia     son has had nausea and vomiting   . Dysrhythmia     paroxsymal A Fib   . Sleep apnea     uses mouth guard  . Pneumonia     hx of   . Bronchitis     hx of   . GERD (gastroesophageal reflux disease)   . Headache     hx of migraines  . Cancer     hx of skin cancers upper arms bilat   . Chronic cystitis   . Shingles     hx of   . Hypercholesterolemia   . Varicose veins     hx of   . Urinary tract infection     hx of   . Measles     hx of   . Mumps     hx of   . Degenerative lumbar disc     hx of   . Osteoarthritis     left knee  . Chronic low back pain   . Hemorrhoids     hx of     Past Surgical History  Procedure Laterality Date  . Knee surgery      arthroscopy right knee 03/2003 left knee 11/2003 and 07/2007  . Hand surgery      carpal  tunnel right hand 04/2003  . Colonscopy    . Dilation and curettage of uterus      hx of in 1992-93  . Leg circulation surgery       varicose veins / laser surgery   . Total knee arthroplasty Left 03/23/2014    Procedure: LEFT TOTAL KNEE ARTHROPLASTY ;  Surgeon: [name], Rodgers;  Location: [facility];  Service: Orthopedics;  Laterality: Left;  . Joint replacement    . Total knee arthroplasty Right 08/21/2014    Procedure: RIGHT TOTAL KNEE ARTHROPLASTY;  Surgeon: Brandi Arabian, Rodgers;  Location: [facility];  Service: Orthopedics;  Laterality: Right;     Current Outpatient Prescriptions  Medication Sig Dispense Refill  . acetaminophen (TYLENOL) 500 MG tablet Take 1,000 mg by mouth every 6 (six) hours as needed for mild pain.    [name] atenolol (TENORMIN) 50 MG tablet Take 0.5 tablets by mouth every morning.     [name] BIOTIN 5000 PO Take 1 tablet by mouth daily.    [name] bismuth  subsalicylate (PEPTO BISMOL) 262 MG/15ML suspension Take 30 mLs by mouth as needed.    . Calcium-Vitamin D-Vitamin K (VIACTIV PO) Take 1 tablet by mouth daily.    [name] ELIQUIS 5 MG TABS tablet TAKE 1 TABLET BY MOUTH TWICE DAILY 60 tablet 5  . furosemide (LASIX) 20 MG tablet Take 20 mg by mouth daily as needed for fluid.     [name] gabapentin (NEURONTIN) 600 MG tablet Take 600-1,200 mg by mouth 2 (two) times daily.     . Multiple Vitamin (MULTIVITAMIN) tablet Take 1 tablet by mouth daily.    . Polyvinyl Alcohol-Povidone (REFRESH OP) Apply 1 drop to eye 2 (two) times daily as needed (for dry eyes).     . ranitidine (ZANTAC) 75 MG tablet Take 75 mg by mouth 2 (two) times daily as needed for heartburn.     . rizatriptan (MAXALT-MLT) 10 MG disintegrating tablet Take 10 mg by mouth as needed for migraine. May repeat in 2 hours if needed     No current facility-administered medications for this visit.    Allergies:   Methocarbamol and Tape    Social History:  The patient  reports that she has never smoked. She has never used smokeless tobacco. She reports that she drinks alcohol. She reports that she does not use illicit drugs.   Family History:  The patient's family history is not on file. there is a history of COPD and atrial fibrillation in her parent's history   ROS:  Please see the history of present illness.   Otherwise, review of systems are positive for none.   All other systems are reviewed and negative.    PHYSICAL EXAM: VS:  BP 138/85 mmHg  Pulse 67  Ht 5\' 8"  (1.727 m)  Wt 274 lb (124.286 kg)  BMI 41.67 kg/m2 , BMI Body mass index is 41.67 kg/(m^2). GEN: Well nourished, well developed, in no acute distress HEENT: normal Neck: no JVD, carotid bruits, or masses Cardiac: RRR; no murmurs, rubs, or gallops,no edema  Respiratory:  clear to auscultation bilaterally, normal work of breathing GI: soft, nontender, nondistended, + BS MS: no deformity or atrophy Skin: warm and dry, no  rash Neuro:  Strength and sensation are intact Psych: euthymic mood, full affect   EKG:  EKG is not ordered today.    Recent Labs: 08/13/2014: ALT 39* 08/23/2014: BUN 11; Creatinine, Ser 0.57; Hemoglobin 10.4*; Platelets 157; Potassium 4.6; Sodium 138    Lipid  Panel No results found for: CHOL, TRIG, HDL, CHOLHDL, VLDL, LDLCALC, LDLDIRECT    Wt Readings from Last 3 Encounters:  11/19/14 274 lb (124.286 kg)  08/21/14 282 lb 3.2 oz (128.005 kg)  08/13/14 282 lb 3.2 oz (128.005 kg)         ASSESSMENT AND PLAN:  1. paroxysmal atrial fibrillation documented by EKG on 01/29/14 at PCP office. Chadssvasc score of at least 3. She had some brief postoperative palpitations lasting 10-15 minutes on the first day after returning home from surgery. Otherwise no recurrent atrial fib.  She is on Apixaban 2.  Osteo-arthritis, doing well status post bilateral knee replacements by Dr. Wynelle Rodgers 3. Hyperlipidemia controlled with diet alone at this point 4. Obesity. Continue careful diet.  She gets exercise by going to water aerobics. 5. essential hypertension. Controlled. 6. peripheral edema, improved. She had an echocardiogram 03/10/14 which showed normal left ventricular systolic function with ejection fraction 55-60% and no significant valve abnormalities and a pulmonary artery pressure of 41   Disposition: Continue Apixaban 5 mg twice a day. Continue current medication. Recheck in 6 months for office visit and EKG.     Current medicines are reviewed at length with the patient today.  The patient does not have concerns regarding medicines.  The following changes have been made:  no change  Labs/ tests ordered today include:  No orders of the defined types were placed in this encounter.      Berna Spare Rodgers 11/19/2014 10:14 AM    [address], [place], [place]  [address] Phone: [phone]; Fax: [phone]

## 2014-11-19 NOTE — Patient Instructions (Signed)
Medication Instructions:  Your physician recommends that you continue on your current medications as directed. Please refer to the Current Medication list given to you today.  Labwork: NONE  Testing/Procedures: NONE  Follow-Up: Your physician wants you to follow-up in: 6 MONTH OV/EKG  You will receive a reminder letter in the mail two months in advance. If you don't receive a letter, please call our office to schedule the follow-up appointment.    

## 2014-11-30 ENCOUNTER — Other Ambulatory Visit (HOSPITAL_COMMUNITY)
Admission: RE | Admit: 2014-11-30 | Discharge: 2014-11-30 | Disposition: A | Discharge status: Home / Self Care | Payer: BC Managed Care – PPO | Source: Ambulatory Visit | Attending: Family Medicine | Admitting: Family Medicine

## 2014-11-30 ENCOUNTER — Other Ambulatory Visit: Payer: Self-pay | Admitting: Family Medicine

## 2014-11-30 DIAGNOSIS — Z01411 Encounter for gynecological examination (general) (routine) with abnormal findings: Secondary | ICD-10-CM | POA: Diagnosis not present

## 2014-12-02 LAB — CYTOLOGY - PAP

## 2015-02-02 ENCOUNTER — Other Ambulatory Visit: Payer: Self-pay | Admitting: *Deleted

## 2015-02-02 MED ORDER — APIXABAN 5 MG PO TABS: 5.0000 mg | ORAL_TABLET | Freq: Two times a day (BID) | ORAL | Status: DC

## 2015-05-31 ENCOUNTER — Other Ambulatory Visit: Payer: Self-pay | Admitting: Cardiology

## 2015-06-15 ENCOUNTER — Encounter: Payer: Self-pay | Admitting: Cardiology

## 2015-06-18 ENCOUNTER — Encounter: Payer: Self-pay | Admitting: Gynecology

## 2015-06-24 ENCOUNTER — Encounter: Payer: Self-pay | Admitting: Cardiology

## 2015-06-24 ENCOUNTER — Ambulatory Visit (INDEPENDENT_AMBULATORY_CARE_PROVIDER_SITE_OTHER): Payer: Medicare Other | Admitting: Cardiology

## 2015-06-24 VITALS — BP 130/90 | HR 60 | Ht 68.0 in | Wt 272.0 lb

## 2015-06-24 DIAGNOSIS — I10 Essential (primary) hypertension: Secondary | ICD-10-CM | POA: Diagnosis not present

## 2015-06-24 DIAGNOSIS — M150 Primary generalized (osteo)arthritis: Secondary | ICD-10-CM

## 2015-06-24 DIAGNOSIS — I480 Paroxysmal atrial fibrillation: Secondary | ICD-10-CM

## 2015-06-24 DIAGNOSIS — M159 Polyosteoarthritis, unspecified: Secondary | ICD-10-CM

## 2015-06-24 DIAGNOSIS — M8949 Other hypertrophic osteoarthropathy, multiple sites: Secondary | ICD-10-CM

## 2015-06-24 NOTE — Patient Instructions (Signed)
Medication Instructions: Your physician recommends that you continue on your current medications as directed. Please refer to the Current Medication list given to you today.  Labwork: none  Testing/Procedures: none  Follow-Up: Your physician wants you to follow-up in: 6 month ov with Dr Sallyanne Kuster  You will receive a reminder letter in the mail two months in advance. If you don't receive a letter, please call our office to schedule the follow-up appointment.  If you need a refill on your cardiac medications before your next appointment, please call your pharmacy.

## 2015-06-24 NOTE — Progress Notes (Signed)
Cardiology Office Note   Date:  06/24/2015   ID:  Brandi Rodgers, DOB 19-Jun-1950, MRN [id number]  PCP:  [name], MD  Cardiologist: Darlin Coco MD  No chief complaint on file.     History of Present Illness: Brandi Rodgers is a 65 y.o. female who presents for Scheduled follow-up visit   She was initially seen Last year at the request of Dr. Jonathon Jordan for evaluation of paroxysmal atrial fibrillation. The patient has a history of intermittent episodes of rapid irregular heart rate dating back 10 years. The episodes occur infrequently. On 01/29/14 she was seen at her PCP office and had documented atrial fibrillation with a ventricular rate of 111. I reviewed the electrocardiogram and concur with the interpretation. That was her last episode of documented atrial fibrillation. Normally she has about 1 episode or less per month. Each episode typically lasts several hours.  She has had occasional brief episodes of fluttering. Normally she can attribute it to consuming too much caffeine. The patient does not have any history of ischemic heart disease. She does not have any history of exertional chest pain. She has a history of occasional ankle edema. She has not had to take any recent Lasix. The patient has a history of migraine headaches and is followed at the headache clinic. She is on gabapentin for prevention of headaches. The patient has a history of osteoarthritis of the knees and underwent a left total knee replacement by Dr. Wynelle Link on March 23, 2014. She underwent right total knee replacement in April 2016. She is doing well following her knee surgeries. She has a history of mild hypercholesterolemia. She is not presently on any statins. Her lipids are followed by her PCP The patient is on Apixaban for her paroxysmal atrial fibrillation. She has occasional blood in her stool from hemorrhoids. She is up-to-date on her colonoscopies. She has a  history of sleep apnea and she wears a mouth guard. She exercises using a stationary bike at home.  She also has a silver sneakers membership in a local gymnasium but has not been using it.  She is retired.  Her husband is getting ready to retire at the end of this month.  Past Medical History  Diagnosis Date  . Paroxysmal a-fib ([place])   . Family history of adverse reaction to anesthesia     son has had nausea and vomiting   . Dysrhythmia     paroxsymal A Fib   . Sleep apnea     uses mouth guard  . Pneumonia     hx of   . Bronchitis     hx of   . GERD (gastroesophageal reflux disease)   . Headache     hx of migraines  . Cancer ([place])     hx of skin cancers upper arms bilat   . Chronic cystitis   . Shingles     hx of   . Hypercholesterolemia   . Varicose veins     hx of   . Urinary tract infection     hx of   . Measles     hx of   . Mumps     hx of   . Degenerative lumbar disc     hx of   . Osteoarthritis     left knee  . Chronic low back pain   . Hemorrhoids     hx of     Past Surgical History  Procedure Laterality Date  .  Knee surgery      arthroscopy right knee 03/2003 left knee 11/2003 and 07/2007  . Hand surgery      carpal tunnel right hand 04/2003  . Colonscopy    . Dilation and curettage of uterus      hx of in 1992-93  . Leg circulation surgery       varicose veins / laser surgery   . Total knee arthroplasty Left 03/23/2014    Procedure: LEFT TOTAL KNEE ARTHROPLASTY ;  Surgeon: [name], MD;  Location: [facility];  Service: Orthopedics;  Laterality: Left;  . Joint replacement    . Total knee arthroplasty Right 08/21/2014    Procedure: RIGHT TOTAL KNEE ARTHROPLASTY;  Surgeon: Gaynelle Arabian, MD;  Location: [facility];  Service: Orthopedics;  Laterality: Right;     Current Outpatient Prescriptions  Medication Sig Dispense Refill  . acetaminophen (TYLENOL) 500 MG tablet Take 1,000 mg by mouth every 6 (six) hours as needed for mild pain.    [name] atenolol  (TENORMIN) 50 MG tablet Take 0.5 tablets by mouth every morning.     [name] BIOTIN 5000 PO Take 1 tablet by mouth daily.    [name] bismuth subsalicylate (PEPTO BISMOL) 262 MG/15ML suspension Take 30 mLs by mouth as needed for diarrhea or loose stools.     . Calcium-Vitamin D-Vitamin K (VIACTIV PO) Take 1 tablet by mouth daily.    [name] ELIQUIS 5 MG TABS tablet TAKE 1 TABLET (5 MG TOTAL) BY MOUTH 2 (TWO) TIMES DAILY. 180 tablet 1  . furosemide (LASIX) 20 MG tablet Take 20 mg by mouth daily as needed for fluid.     [name] gabapentin (NEURONTIN) 600 MG tablet Take 600 mg by mouth daily. Take 1200 mg by mouth daily in the evening.    . Multiple Vitamin (MULTIVITAMIN) tablet Take 1 tablet by mouth daily.    . Polyvinyl Alcohol-Povidone (REFRESH OP) Apply 1 drop to eye 2 (two) times daily as needed (for dry eyes).     . ranitidine (ZANTAC) 75 MG tablet Take 75 mg by mouth 2 (two) times daily as needed for heartburn.     . rizatriptan (MAXALT-MLT) 10 MG disintegrating tablet Take 10 mg by mouth as needed for migraine. May repeat in 2 hours if needed     No current facility-administered medications for this visit.    Allergies:   Methocarbamol and Tape    Social History:  The patient  reports that she has never smoked. She has never used smokeless tobacco. She reports that she drinks alcohol. She reports that she does not use illicit drugs.   Family History:  The patient's family history is not on file.    ROS:  Please see the history of present illness.   Otherwise, review of systems are positive for none.   All other systems are reviewed and negative.    PHYSICAL EXAM: VS:  BP 130/90 mmHg  Pulse 60  Ht 5\' 8"  (1.727 m)  Wt 272 lb (123.378 kg)  BMI 41.37 kg/m2 , BMI Body mass index is 41.37 kg/(m^2). GEN: Well nourished, well developed, in no acute distress HEENT: normal Neck: no JVD, carotid bruits, or masses Cardiac: RRR; no murmurs, rubs, or gallops,no edema  Respiratory:  clear to auscultation  bilaterally, normal work of breathing GI: soft, nontender, nondistended, + BS MS: no deformity or atrophy Skin: warm and dry, no rash Neuro:  Strength and sensation are intact Psych: euthymic mood, full affect   EKG:  EKG  is ordered today. The ekg ordered today demonstrates Normal sinus rhythm.  Within normal limits.   Recent Labs: 08/13/2014: ALT 39* 08/23/2014: BUN 11; Creatinine, Ser 0.57; Hemoglobin 10.4*; Platelets 157; Potassium 4.6; Sodium 138    Lipid Panel No results found for: CHOL, TRIG, HDL, CHOLHDL, VLDL, LDLCALC, LDLDIRECT    Wt Readings from Last 3 Encounters:  06/24/15 272 lb (123.378 kg)  11/19/14 274 lb (124.286 kg)  08/21/14 282 lb 3.2 oz (128.005 kg)        ASSESSMENT AND PLAN:  1. paroxysmal atrial fibrillation documented by EKG on 01/29/14 at PCP office. Chadssvasc score of at least 3. She had some brief postoperative palpitations lasting 10-15 minutes on the first day after returning home from surgery. Otherwise no recurrent atrial fib. She is on Apixaban 2. Osteo-arthritis, doing well status post bilateral knee replacements by Dr. Wynelle Link 3. Hyperlipidemia controlled with diet alone at this point 4. Obesity. Continue careful diet. She gets exercise by Using a stationary bicycle 5. essential hypertension. Controlled. 6. peripheral edema, improved. She had an echocardiogram 03/10/14 which showed normal left ventricular systolic function with ejection fraction 55-60% and no significant valve abnormalities and a pulmonary artery pressure of 41   Disposition: Continue Apixaban 5 mg twice a day. Continue current medication. Recheck in 6 months for office visit With Dr. Sallyanne Kuster.    Current medicines are reviewed at length with the patient today.  The patient does not have concerns regarding medicines.  The following changes have been made:  no change  Labs/ tests ordered today include:  No orders of the defined types were placed in this  encounter.      Berna Spare MD 06/24/2015 9:57 AM    [address], [place], [place]  [address] Phone: [phone]; Fax: [phone]

## 2015-06-25 ENCOUNTER — Encounter: Payer: Self-pay | Admitting: Gynecology

## 2015-06-28 ENCOUNTER — Other Ambulatory Visit: Payer: Self-pay | Admitting: Radiology

## 2015-06-30 ENCOUNTER — Encounter: Payer: Self-pay | Admitting: Gynecology

## 2015-07-02 ENCOUNTER — Telehealth: Payer: Self-pay | Admitting: *Deleted

## 2015-07-02 ENCOUNTER — Encounter: Payer: Self-pay | Admitting: *Deleted

## 2015-07-02 DIAGNOSIS — C50511 Malignant neoplasm of lower-outer quadrant of right female breast: Secondary | ICD-10-CM | POA: Insufficient documentation

## 2015-07-02 HISTORY — DX: Malignant neoplasm of lower-outer quadrant of right female breast: C50.511

## 2015-07-02 NOTE — Telephone Encounter (Signed)
Confirmed [facility] for 07/07/15 at 1215 .  Instructions and contact information given. 

## 2015-07-07 ENCOUNTER — Encounter: Payer: Self-pay | Admitting: Physical Therapy

## 2015-07-07 ENCOUNTER — Encounter: Payer: Self-pay | Admitting: Nurse Practitioner

## 2015-07-07 ENCOUNTER — Encounter: Payer: Self-pay | Admitting: Hematology and Oncology

## 2015-07-07 ENCOUNTER — Other Ambulatory Visit (HOSPITAL_BASED_OUTPATIENT_CLINIC_OR_DEPARTMENT_OTHER): Payer: Medicare Other

## 2015-07-07 ENCOUNTER — Ambulatory Visit
Admission: RE | Admit: 2015-07-07 | Discharge: 2015-07-07 | Disposition: A | Discharge status: Home / Self Care | Payer: Medicare Other | Source: Ambulatory Visit | Attending: Radiation Oncology | Admitting: Radiation Oncology

## 2015-07-07 ENCOUNTER — Ambulatory Visit (HOSPITAL_BASED_OUTPATIENT_CLINIC_OR_DEPARTMENT_OTHER): Payer: Medicare Other | Admitting: Hematology and Oncology

## 2015-07-07 ENCOUNTER — Other Ambulatory Visit: Payer: Self-pay | Admitting: General Surgery

## 2015-07-07 ENCOUNTER — Ambulatory Visit: Payer: Medicare Other | Attending: General Surgery | Admitting: Physical Therapy

## 2015-07-07 VITALS — BP 136/56 | HR 69 | Temp 98.2°F | Resp 20 | Wt 275.1 lb

## 2015-07-07 DIAGNOSIS — C50511 Malignant neoplasm of lower-outer quadrant of right female breast: Secondary | ICD-10-CM

## 2015-07-07 DIAGNOSIS — M25611 Stiffness of right shoulder, not elsewhere classified: Secondary | ICD-10-CM

## 2015-07-07 DIAGNOSIS — Z808 Family history of malignant neoplasm of other organs or systems: Secondary | ICD-10-CM | POA: Diagnosis not present

## 2015-07-07 DIAGNOSIS — M25511 Pain in right shoulder: Secondary | ICD-10-CM | POA: Diagnosis present

## 2015-07-07 DIAGNOSIS — R293 Abnormal posture: Secondary | ICD-10-CM | POA: Diagnosis present

## 2015-07-07 DIAGNOSIS — Z801 Family history of malignant neoplasm of trachea, bronchus and lung: Secondary | ICD-10-CM | POA: Diagnosis not present

## 2015-07-07 LAB — CBC WITH DIFFERENTIAL/PLATELET
BASO%: 0.6 % (ref 0.0–2.0)
Basophils Absolute: 0.1 10*3/uL (ref 0.0–0.1)
EOS ABS: 0.2 10*3/uL (ref 0.0–0.5)
EOS%: 2.8 % (ref 0.0–7.0)
HCT: 38.8 % (ref 34.8–46.6)
HGB: 12.8 g/dL (ref 11.6–15.9)
LYMPH%: 14.9 % (ref 14.0–49.7)
MCH: 26.2 pg (ref 25.1–34.0)
MCHC: 33 g/dL (ref 31.5–36.0)
MCV: 79.4 fL — AB (ref 79.5–101.0)
MONO#: 0.5 10*3/uL (ref 0.1–0.9)
MONO%: 5.9 % (ref 0.0–14.0)
NEUT%: 75.8 % (ref 38.4–76.8)
NEUTROS ABS: 6.6 10*3/uL — AB (ref 1.5–6.5)
Platelets: 253 10*3/uL (ref 145–400)
RBC: 4.89 10*6/uL (ref 3.70–5.45)
RDW: 17.2 % — ABNORMAL HIGH (ref 11.2–14.5)
WBC: 8.7 10*3/uL (ref 3.9–10.3)
lymph#: 1.3 10*3/uL (ref 0.9–3.3)

## 2015-07-07 LAB — COMPREHENSIVE METABOLIC PANEL
ALT: 18 U/L (ref 0–55)
AST: 22 U/L (ref 5–34)
Albumin: 3.3 g/dL — ABNORMAL LOW (ref 3.5–5.0)
Alkaline Phosphatase: 127 U/L (ref 40–150)
Anion Gap: 9 mEq/L (ref 3–11)
BILIRUBIN TOTAL: 0.52 mg/dL (ref 0.20–1.20)
BUN: 10.9 mg/dL (ref 7.0–26.0)
CO2: 29 meq/L (ref 22–29)
Calcium: 10 mg/dL (ref 8.4–10.4)
Chloride: 103 mEq/L (ref 98–109)
Creatinine: 0.8 mg/dL (ref 0.6–1.1)
EGFR: 84 mL/min/{1.73_m2} — AB (ref >90)
GLUCOSE: 91 mg/dL (ref 70–140)
POTASSIUM: 4 meq/L (ref 3.5–5.1)
SODIUM: 142 meq/L (ref 136–145)
TOTAL PROTEIN: 7.1 g/dL (ref 6.4–8.3)

## 2015-07-07 NOTE — Progress Notes (Signed)
   [facility] Psychosocial Distress Screening Counseling Intern Note  Clinical Social Work was referred by distress screening protocol.  The patient scored a 3 on the Psychosocial Distress Thermometer which indicates [place] distress. Counseling Intern Lattie Haw to assess for distress and other psychosocial needs.   [facility] DISTRESS SCREENING 07/07/2015  Distress experienced in past week (1-10) 3  Emotional problem type Nervousness/Anxiety;Adjusting to illness  Information Concerns Type Lack of info about diagnosis;Lack of info about treatment  Physical Problem type Mouth sores/swallowing  Referral to support programs Yes   Counseling Intern Note: Met with Pt and husband Ray during [place]. Pt reported that she felt better after receiving more information.  Pt reported that her information needs had been met during clinic and that her nervousness/anxiety is related to her Dx. In terms of physical concerns, pt noted having a mouth sore which is unusual for her and said that she would let her Dr. Gwyndolyn Kaufman. Pt reports having a broad support system including her husband, son, other family members and friends.  Counseling intern introduced and provided information about services and programs available in the support center.   FU: No specific support center follow up is indicated at this time. Pt stated that she would reach out as needed.

## 2015-07-07 NOTE — Assessment & Plan Note (Signed)
06/25/2015: Right breast screening det focal asymmetry plus calcs 4 mm at 8:00 axilla neg, 3-D biopsy: Grade 1 IDC with DCIS and calcifications, ER 95%, PR 90% HER-2 negative ratio 1.70, KI 67:15%, clinical stage: T1aN0 Stage 1A   Pathology and radiology counseling:Discussed with the patient, the details of pathology including the type of breast cancer,the clinical staging, the significance of ER, PR and HER-2/neu receptors and the implications for treatment. After reviewing the pathology in detail, we proceeded to discuss the different treatment options between surgery, radiation, chemotherapy, antiestrogen therapies.  Recommendations: 1. Breast conserving surgery followed by 2. Oncotype DX testing to determine if chemotherapy would be of any benefit followed by 3. Adjuvant radiation therapy followed by 4. Adjuvant antiestrogen therapy  Oncotype counseling: I discussed Oncotype DX test. I explained to the patient that this is a 21 gene panel to evaluate patient tumors DNA to calculate recurrence score. This would help determine whether patient has high risk or intermediate risk or low risk breast cancer. She understands that if her tumor was found to be high risk, she would benefit from systemic chemotherapy. If low risk, no need of chemotherapy. If she was found to be intermediate risk, we would need to evaluate the score as well as other risk factors and determine if an abbreviated chemotherapy may be of benefit.  Return to clinic after surgery to discuss final pathology report and then determine if Oncotype DX testing will need to be sent.

## 2015-07-07 NOTE — Progress Notes (Signed)
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  HISTORY OF PRESENT ILLNESS::Brandi Rodgers is a 65 y.o. female who presents today because of an abnormal mammogram, revealing a 4 mm hypoechoic mass at the 8 o'clock position of the lower-outer quadrant of the right breast. She had a tomography guided biopsy 06/28/2015, revealing Grade 1 invasive and in situ ductal carcinoma with calcifications, ER (95%), PR (90%), Her2-neu negative, and Ki 67 15%, with a negative axilla.  PREVIOUS RADIATION THERAPY: No  PAST MEDICAL HISTORY:  has a past medical history of Paroxysmal a-fib ([place]); Family history of adverse reaction to anesthesia; Dysrhythmia; Sleep apnea; Pneumonia; Bronchitis; GERD (gastroesophageal reflux disease); Headache; Cancer ([place]); Chronic cystitis; Shingles; Hypercholesterolemia; Varicose veins; Urinary tract infection; Measles; Mumps; Degenerative lumbar disc; Osteoarthritis; Chronic low back pain; Hemorrhoids; and Breast cancer of lower-outer quadrant of right female breast ([place]) (07/02/2015).    PAST SURGICAL HISTORY: Past Surgical History  Procedure Laterality Date  . Knee surgery      arthroscopy right knee 03/2003 left knee 11/2003 and 07/2007  . Hand surgery      carpal tunnel right hand 04/2003  . Colonscopy    . Dilation and curettage of uterus      hx of in 1992-93  . Leg circulation surgery       varicose veins / laser surgery   . Total knee arthroplasty Left 03/23/2014    Procedure: LEFT TOTAL KNEE ARTHROPLASTY ;  Surgeon: [name], MD;  Location: [facility];  Service: Orthopedics;  Laterality: Left;  . Joint  replacement    . Total knee arthroplasty Right 08/21/2014    Procedure: RIGHT TOTAL KNEE ARTHROPLASTY;  Surgeon: Gaynelle Arabian, MD;  Location: [facility];  Service: Orthopedics;  Laterality: Right;    FAMILY HISTORY: family history is not on file.  SOCIAL HISTORY:  reports that she has never smoked. She has never used smokeless tobacco. She reports that she drinks alcohol. She reports that she does not use illicit drugs.  ALLERGIES: Methocarbamol and Tape  MEDICATIONS:  Current Outpatient Prescriptions  Medication Sig Dispense Refill  . acetaminophen (TYLENOL) 500 MG tablet Take 1,000 mg by mouth every 6 (six) hours as needed for mild pain.    [name] atenolol (TENORMIN) 50 MG tablet Take 0.5 tablets by mouth every morning.     [name] BIOTIN 5000 PO Take 1 tablet by mouth daily.    [name] bismuth subsalicylate (PEPTO BISMOL) 262 MG/15ML suspension Take 30 mLs by mouth as needed for diarrhea or loose stools.     . Calcium-Vitamin D-Vitamin K (VIACTIV PO) Take 1 tablet by mouth daily.    [name] ELIQUIS 5 MG TABS tablet TAKE 1 TABLET (5 MG TOTAL) BY MOUTH 2 (TWO) TIMES DAILY. 180 tablet 1  . furosemide (LASIX) 20 MG tablet Take 20 mg by mouth daily as needed for fluid.     [name] gabapentin (NEURONTIN) 600 MG tablet Take 600 mg by mouth daily. Take 1200 mg by mouth daily in the evening.    . Multiple Vitamin (MULTIVITAMIN) tablet Take 1 tablet by mouth daily.    . Polyvinyl Alcohol-Povidone (REFRESH OP) Apply 1 drop to  eye 2 (two) times daily as needed (for dry eyes).     . ranitidine (ZANTAC) 75 MG tablet Take 75 mg by mouth 2 (two) times daily as needed for heartburn.     . rizatriptan (MAXALT-MLT) 10 MG disintegrating tablet Take 10 mg by mouth as needed for migraine. May repeat in 2 hours if needed     No current facility-administered medications for this encounter.    REVIEW OF SYSTEMS:  A 15 point review of systems is documented in the electronic medical record. This was obtained by the nursing staff. However, I  reviewed this with the patient to discuss relevant findings and make appropriate changes.  Pertinent items are noted in HPI.   PHYSICAL EXAM:  Vitals - 1 value per visit 8/85/0277  SYSTOLIC 412  DIASTOLIC 56  Pulse 69  Temperature 98.2  Respirations 20  Weight (lb) 275.1  Height   BMI 41.84  VISIT REPORT    General: Alert and oriented, in no acute distress, accompanied by husband Neck: Neck is supple, no palpable cervical or supraclavicular lymphadenopathy. Heart: Regular in rate and rhythm with no murmurs, rubs, or gallops. Chest: Clear to auscultation bilaterally, with no rhonchi, wheezes, or rales. Extremities: No cyanosis or edema. Lymphatics: see Neck Exam Psychiatric: Judgment and insight are intact. Affect is appropriate. Breast: She has a biopsy site on the lower outer quadrant of the right breast with mild bruising. No dominant mass in either breast nipple discharge or bleeding  Gynecologic History  Age at first menstrual period? 12  Are you still having periods? No Approximate date of last period? 2005  If you no longer have periods: Have you used hormone replacement? No Obstetric History:  How many children have you carried to term? 1 Your age at first live birth? 53  Pregnant now or trying to get pregnant? No  Have you used birth control pills or hormone shots for contraception? Yes  If so, for how long (or approximate dates)? Birth control - 20 years [phone])  Would you be interested in learning more about the options to preserve fertility? No Health Maintenance:  Have you ever had a colonoscopy? Yes If yes, date? 2006, 2013  Have you ever had a bone density? Yes If yes, date? 06/15/2015  Date of your last PAP smear? 11-30-2014 Date of your FIRST mammogram? Age 21     ECOG = 0  LABORATORY DATA:  Lab Results  Component Value Date   WBC 8.7 07/07/2015   HGB 12.8 07/07/2015   HCT 38.8 07/07/2015   MCV 79.4* 07/07/2015   PLT 253 07/07/2015   NEUTROABS 6.6*  07/07/2015   Lab Results  Component Value Date   NA 138 08/23/2014   K 4.6 08/23/2014   CL 103 08/23/2014   CO2 30 08/23/2014   GLUCOSE 136* 08/23/2014   CREATININE 0.57 08/23/2014   CALCIUM 8.8 08/23/2014      RADIOGRAPHY: No results found.    IMPRESSION: Clinical Stage IA (T1a, N0, M0)Grade 1 invasive and in situ ductal carcinoma presenting in the lower outer quadrant of the right breast.  She is a good candidate for a right lumpectomy with sentinel node biopsy, external radiation, and anti-estrogen therapy.  PLAN: She will have a right lumpectomy and sentinel node biopsy. She will follow up with me two weeks post-op.     ------------------------------------------------  Blair Promise, PhD, MD    This document serves as a record of services personally performed by Gery Pray, MD. It  was created on his behalf by Lendon Collar, a trained medical scribe. The creation of this record is based on the scribe's personal observations and the provider's statements to them. This document has been checked and approved by the attending provider.

## 2015-07-07 NOTE — Progress Notes (Signed)
[facility] CONSULT NOTE  Patient Care Team: Jonathon Jordan, MD as PCP - General (Family Medicine)  CHIEF COMPLAINTS/PURPOSE OF CONSULTATION:  Newly diagnosed breast cancer  HISTORY OF PRESENTING ILLNESS:  Brandi Rodgers 65 y.o. female is here because of recent diagnosis of right breast cancer. Patient had a routine screening mammogram that revealed right breast asymmetry along with calcifications measuring 1.5 cm. By ultrasound this measured only 4 mm at 8:00 position. The axilla is negative for any lymphadenopathy.. Biopsy was performed which showed grade 1 invasive ductal carcinoma with DCIS and calcification was ER/PR positive HER-2 negative with a Ki-67 of 15%. She was presented this morning in the multidisciplinary breast tumor board and she is here today at the [facility] clinic to discuss a treatment plan.  I reviewed her records extensively and collaborated the history with the patient.  SUMMARY OF ONCOLOGIC HISTORY:   Breast cancer of lower-outer quadrant of right female breast ([place])   06/25/2015 Initial Diagnosis Right breast screening det focal asymmetry plus calcs 4 mm at 8:00 axilla neg, 3-D biopsy: Grade 1 IDC with DCIS and calcifications, ER 95%, PR 90% HER-2 negative ratio 1.70, KI 67:15%, clinical stage: T1aN0 Stage 1A     In terms of breast cancer risk profile:  She menarched at early age of 5 and went to menopause at age 81  She had 1 pregnancy, her first child was born at age 86  She has received birth control pills for approximately 20 years.  She was never exposed to fertility medications or hormone replacement therapy.  She has no family history of Breast/GYN/GI cancer  MEDICAL HISTORY:  Past Medical History  Diagnosis Date  . Paroxysmal a-fib ([place])   . Family history of adverse reaction to anesthesia     son has had nausea and vomiting   . Dysrhythmia     paroxsymal A Fib   . Sleep apnea     uses mouth guard  . Pneumonia     hx of   .  Bronchitis     hx of   . GERD (gastroesophageal reflux disease)   . Headache     hx of migraines  . Cancer ([place])     hx of skin cancers upper arms bilat   . Chronic cystitis   . Shingles     hx of   . Hypercholesterolemia   . Varicose veins     hx of   . Urinary tract infection     hx of   . Measles     hx of   . Mumps     hx of   . Degenerative lumbar disc     hx of   . Osteoarthritis     left knee  . Chronic low back pain   . Hemorrhoids     hx of   . Breast cancer of lower-outer quadrant of right female breast ([place]) 07/02/2015  . Hot flashes     SURGICAL HISTORY: Past Surgical History  Procedure Laterality Date  . Knee surgery      arthroscopy right knee 03/2003 left knee 11/2003 and 07/2007  . Hand surgery      carpal tunnel right hand 04/2003  . Colonscopy    . Dilation and curettage of uterus      hx of in 1992-93  . Leg circulation surgery       varicose veins / laser surgery   . Total knee arthroplasty Left 03/23/2014  Procedure: LEFT TOTAL KNEE ARTHROPLASTY ;  Surgeon: [name], MD;  Location: [facility];  Service: Orthopedics;  Laterality: Left;  . Joint replacement    . Total knee arthroplasty Right 08/21/2014    Procedure: RIGHT TOTAL KNEE ARTHROPLASTY;  Surgeon: Gaynelle Arabian, MD;  Location: [facility];  Service: Orthopedics;  Laterality: Right;    SOCIAL HISTORY: Social History   Social History  . Marital Status: Married    Spouse Name: N/A  . Number of Children: N/A  . Years of Education: N/A   Occupational History  . Not on file.   Social History Main Topics  . Smoking status: Never Smoker   . Smokeless tobacco: Never Used  . Alcohol Use: Yes     Comment: occ  . Drug Use: No  . Sexual Activity: Yes   Other Topics Concern  . Not on file   Social History Narrative    FAMILY HISTORY: Family History  Problem Relation Age of Onset  . Lung cancer Mother   . Thyroid cancer Mother     ALLERGIES:  is allergic to methocarbamol and  tape.  MEDICATIONS:  Current Outpatient Prescriptions  Medication Sig Dispense Refill  . acetaminophen (TYLENOL) 500 MG tablet Take 1,000 mg by mouth every 6 (six) hours as needed for mild pain.    [name] atenolol (TENORMIN) 50 MG tablet Take 0.5 tablets by mouth every morning.     [name] BIOTIN 5000 PO Take 1 tablet by mouth daily.    [name] bismuth subsalicylate (PEPTO BISMOL) 262 MG/15ML suspension Take 30 mLs by mouth as needed for diarrhea or loose stools.     . Calcium-Vitamin D-Vitamin K (VIACTIV PO) Take 1 tablet by mouth daily.    [name] ELIQUIS 5 MG TABS tablet TAKE 1 TABLET (5 MG TOTAL) BY MOUTH 2 (TWO) TIMES DAILY. 180 tablet 1  . furosemide (LASIX) 20 MG tablet Take 20 mg by mouth daily as needed for fluid.     [name] gabapentin (NEURONTIN) 600 MG tablet Take 600 mg by mouth daily. Take 1200 mg by mouth daily in the evening.    . Multiple Vitamin (MULTIVITAMIN) tablet Take 1 tablet by mouth daily.    . Polyvinyl Alcohol-Povidone (REFRESH OP) Apply 1 drop to eye 2 (two) times daily as needed (for dry eyes).     . ranitidine (ZANTAC) 75 MG tablet Take 75 mg by mouth 2 (two) times daily as needed for heartburn.     . rizatriptan (MAXALT-MLT) 10 MG disintegrating tablet Take 10 mg by mouth as needed for migraine. May repeat in 2 hours if needed     No current facility-administered medications for this visit.    REVIEW OF SYSTEMS:   Constitutional: Denies fevers, chills or abnormal night sweats Eyes: Denies blurriness of vision, double vision or watery eyes Ears, nose, mouth, throat, and face: Denies mucositis or sore throat Respiratory: Denies cough, dyspnea or wheezes Cardiovascular: Denies palpitation, chest discomfort or lower extremity swelling Gastrointestinal:  Denies nausea, heartburn or change in bowel habits Skin: Denies abnormal skin rashes Lymphatics: Denies new lymphadenopathy or easy bruising Neurological:Denies numbness, tingling or new weaknesses Behavioral/Psych: Mood is stable, no new  changes  Breast:  Denies any palpable lumps or discharge All other systems were reviewed with the patient and are negative.  PHYSICAL EXAMINATION: ECOG PERFORMANCE STATUS: 0 - Asymptomatic  Filed Vitals:   07/07/15 1245  BP: 136/56  Pulse: 69  Temp: 98.2 F (36.8 C)  Resp: 20   Filed Weights  07/07/15 1245  Weight: 275 lb 1.6 oz (124.785 kg)    GENERAL:alert, no distress and comfortable SKIN: skin color, texture, turgor are normal, no rashes or significant lesions EYES: normal, conjunctiva are pink and non-injected, sclera clear OROPHARYNX:no exudate, no erythema and lips, buccal mucosa, and tongue normal  NECK: supple, thyroid normal size, non-tender, without nodularity LYMPH:  no palpable lymphadenopathy in the cervical, axillary or inguinal LUNGS: clear to auscultation and percussion with normal breathing effort HEART: regular rate & rhythm and no murmurs and no lower extremity edema ABDOMEN:abdomen soft, non-tender and normal bowel sounds Musculoskeletal:no cyanosis of digits and no clubbing  PSYCH: alert & oriented x 3 with fluent speech NEURO: no focal motor/sensory deficits BREAST: No palpable nodules in breast. No palpable axillary or supraclavicular lymphadenopathy (exam performed in the presence of a chaperone)   LABORATORY DATA:  I have reviewed the data as listed Lab Results  Component Value Date   WBC 8.7 07/07/2015   HGB 12.8 07/07/2015   HCT 38.8 07/07/2015   MCV 79.4* 07/07/2015   PLT 253 07/07/2015   Lab Results  Component Value Date   NA 142 07/07/2015   K 4.0 07/07/2015   CL 103 08/23/2014   CO2 29 07/07/2015    RADIOGRAPHIC STUDIES: I have personally reviewed the radiological reports and agreed with the findings in the report.  ASSESSMENT AND PLAN:  Breast cancer of lower-outer quadrant of right female breast ([place]) 06/25/2015: Right breast screening det focal asymmetry plus calcs 4 mm at 8:00 axilla neg, 3-D biopsy: Grade 1 IDC with DCIS  and calcifications, ER 95%, PR 90% HER-2 negative ratio 1.70, KI 67:15%, clinical stage: T1aN0 Stage 1A   Pathology and radiology counseling:Discussed with the patient, the details of pathology including the type of breast cancer,the clinical staging, the significance of ER, PR and HER-2/neu receptors and the implications for treatment. After reviewing the pathology in detail, we proceeded to discuss the different treatment options between surgery, radiation, chemotherapy, antiestrogen therapies.  Recommendations: 1. Breast conserving surgery followed by 2. Oncotype DX testing (if final tumor size is greater than 5 mm) 3. Adjuvant radiation therapy followed by 4. Adjuvant antiestrogen therapy  Oncotype counseling: I discussed Oncotype DX test. I explained to the patient that this is a 21 gene panel to evaluate patient tumors DNA to calculate recurrence score. This would help determine whether patient has high risk or intermediate risk or low risk breast cancer. She understands that if her tumor was found to be high risk, she would benefit from systemic chemotherapy. If low risk, no need of chemotherapy. If she was found to be intermediate risk, we would need to evaluate the score as well as other risk factors and determine if an abbreviated chemotherapy may be of benefit.  Return to clinic after surgery to discuss final pathology report and then determine if Oncotype DX testing will need to be sent.  All questions were answered. The patient knows to call the clinic with any problems, questions or concerns.    Rulon Eisenmenger, MD 07/07/2015

## 2015-07-07 NOTE — Patient Instructions (Signed)

## 2015-07-07 NOTE — Progress Notes (Signed)
Brandi Rodgers is a very pleasant 65 y.o. female from [place], [place] with newly diagnosed invasive ductal carcinoma with ductal carcinoma in situ of the right breast.  Biopsy results revealed the tumor's prognostic profile is ER positive, PR positive, and HER2/neu negative.   She presents today with her husband to the [facility]) for treatment consideration and recommendations from the breast surgeon, radiation oncologist, and medical oncologist.     I briefly met with Brandi Rodgers and her husband during her [facility] visit today. We discussed the purpose of the Survivorship Clinic, which will include monitoring for recurrence, coordinating completion of age and gender-appropriate cancer screenings, promotion of overall wellness, as well as managing potential late/long-term side effects of anti-cancer treatments.    The treatment plan for Brandi Rodgers will likely include surgery, radiation therapy, and anti-estrogen therapy.  As of today, the intent of treatment for Brandi Rodgers is cure, therefore she will be eligible for the Survivorship Clinic upon her completion of treatment.  Her survivorship care plan (SCP) document will be drafted and updated throughout the course of her treatment trajectory. She will receive the SCP in an office visit with myself in the Survivorship Clinic once she has completed treatment.   Brandi Rodgers was encouraged to ask questions and all questions were answered to her satisfaction.  She was given my business card and encouraged to contact me with any concerns regarding survivorship.  I look forward to participating in her care.   Kenn File, [place] [phone]

## 2015-07-07 NOTE — Therapy (Signed)
[place], [place], [address] Phone: [phone]   Fax:  [phone]  Physical Therapy Evaluation  Patient Details  Name: Brandi Rodgers MRN: [id number] Date of Birth: 04/19/1951 Referring Provider: Dr. [name]  Encounter Date: 07/07/2015      PT End of Session - 07/07/15 1359    Visit Number 1   Number of Visits 1   PT Start Time 1310   PT Stop Time 1342   PT Time Calculation (min) 32 min   Activity Tolerance Patient tolerated treatment well   Behavior During Therapy [facility] for tasks assessed/performed      Past Medical History  Diagnosis Date  . Paroxysmal a-fib ([place])   . Family history of adverse reaction to anesthesia     son has had nausea and vomiting   . Dysrhythmia     paroxsymal A Fib   . Sleep apnea     uses mouth guard  . Pneumonia     hx of   . Bronchitis     hx of   . GERD (gastroesophageal reflux disease)   . Headache     hx of migraines  . Cancer ([place])     hx of skin cancers upper arms bilat   . Chronic cystitis   . Shingles     hx of   . Hypercholesterolemia   . Varicose veins     hx of   . Urinary tract infection     hx of   . Measles     hx of   . Mumps     hx of   . Degenerative lumbar disc     hx of   . Osteoarthritis     left knee  . Chronic low back pain   . Hemorrhoids     hx of   . Breast cancer of lower-outer quadrant of right female breast ([place]) 07/02/2015  . Hot flashes     Past Surgical History  Procedure Laterality Date  . Knee surgery      arthroscopy right knee 03/2003 left knee 11/2003 and 07/2007  . Hand surgery      carpal tunnel right hand 04/2003  . Colonscopy    . Dilation and curettage of uterus      hx of in 1992-93  . Leg circulation surgery       varicose veins / laser surgery   . Total knee arthroplasty Left 03/23/2014    Procedure: LEFT TOTAL KNEE ARTHROPLASTY ;  Surgeon: [name], MD;  Location: [facility];   Service: Orthopedics;  Laterality: Left;  . Joint replacement    . Total knee arthroplasty Right 08/21/2014    Procedure: RIGHT TOTAL KNEE ARTHROPLASTY;  Surgeon: Gaynelle Arabian, MD;  Location: [facility];  Service: Orthopedics;  Laterality: Right;    There were no vitals filed for this visit.  Visit Diagnosis:  Carcinoma of lower outer quadrant of right breast ([place]) - Plan: PT plan of care cert/re-cert  Abnormal posture - Plan: PT plan of care cert/re-cert  Shoulder stiffness, right - Plan: PT plan of care cert/re-cert  Right shoulder pain - Plan: PT plan of care cert/re-cert      Subjective Assessment - 07/07/15 1351    Subjective Patient was seen today for a baseline assessment of her newly diagnosed right breast cancer. Her case was discussed among her medical team in the breast multidisciplinary clinic and a recommended treatment plan was determined.  Patient is accompained by: Family member   Pertinent History Patient was diagnosed 06/15/15 with right invasive and DCIS breast cancer.  It is grade 1, ER/PR positive, HER2 negative, and measures 4 mm in the lower outer quadrant.   Patient Stated Goals Reduce lymphedema risk and learn post op shoulder ROm HEP.   Currently in Pain? Yes   Pain Score 6    Pain Location Neck   Pain Orientation Right   Pain Descriptors / Indicators Sharp   Pain Type Chronic pain   Pain Radiating Towards Right shoulder   Pain Onset More than a month ago   Pain Frequency Intermittent   Aggravating Factors  Raising arms and doing any overhead ROM   Pain Relieving Factors rest / heat   Multiple Pain Sites Yes   Pain Score 5   Pain Location Back  and knees   Pain Orientation Right;Left;Lower   Pain Descriptors / Indicators Aching   Pain Type Chronic pain   Pain Onset More than a month ago   Pain Frequency Intermittent   Aggravating Factors  AROM; pt reports her back and knee pain is related to arthritis   Pain Relieving Factors heat             [facility] PT Assessment - 07/07/15 0001    Assessment   Medical Diagnosis Right breast cancer   Referring Provider Dr. [name]   Onset Date/Surgical Date 06/15/15   Hand Dominance Right   Prior Therapy none   Precautions   Precautions Other (comment)   Precaution Comments active breast cancer; recent right TKR 11/16   Restrictions   Weight Bearing Restrictions No   Balance Screen   Has the patient fallen in the past 6 months No   Has the patient had a decrease in activity level because of a fear of falling?  No   Is the patient reluctant to leave their home because of a fear of falling?  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   Leisure She does not exercise   Cognition   Overall Cognitive Status Within Functional Limits for tasks assessed   Posture/Postural Control   Posture/Postural Control Postural limitations   Postural Limitations Forward head;Rounded Shoulders;Decreased lumbar lordosis   ROM / Strength   AROM / PROM / Strength AROM;Strength   AROM   AROM Assessment Site Shoulder   Right/Left Shoulder Right;Left   Right Shoulder Extension 52 Degrees   Right Shoulder Flexion 144 Degrees   Right Shoulder ABduction 125 Degrees   Right Shoulder Internal Rotation 61 Degrees   Right Shoulder External Rotation 81 Degrees   Left Shoulder Extension 56 Degrees   Left Shoulder Flexion 137 Degrees   Left Shoulder ABduction 140 Degrees   Left Shoulder Internal Rotation 70 Degrees   Left Shoulder External Rotation 82 Degrees   Strength   Overall Strength Unable to assess;Due to pain  in right shoulder           LYMPHEDEMA/ONCOLOGY QUESTIONNAIRE - 07/07/15 1357    Type   Cancer Type Right breast cancer   Lymphedema Assessments   Lymphedema  Assessments Upper extremities   Right Upper Extremity Lymphedema   10 cm Proximal to Olecranon Process 34 cm   Olecranon Process 29.5 cm  10 cm Proximal to Ulnar Styloid Process 24.1 cm   Just Proximal to Ulnar Styloid Process 17.3 cm   Across Hand at [employer] 19.9 cm   At [place] of 2nd Digit 6.7 cm   Left Upper Extremity Lymphedema   10 cm Proximal to Olecranon Process 35.9 cm   Olecranon Process 29.5 cm   10 cm Proximal to Ulnar Styloid Process 25.9 cm   Just Proximal to Ulnar Styloid Process 18 cm   Across Hand at [employer] 19.5 cm   At [place] of 2nd Digit 6.6 cm       Patient was instructed today in a home exercise program today for post op shoulder range of motion. These included active assist shoulder flexion in sitting, scapular retraction, wall walking with shoulder abduction, and hands behind head external rotation.  She was encouraged to do these twice a day, holding 3 seconds and repeating 5 times when permitted by her physician.         PT Education - 07-24-15 1358    Education provided Yes   Education Details Lymphedema risk reduction and post op shoulder ROM   Person(s) Educated Patient;Spouse   Methods Explanation;Demonstration;Handout   Comprehension Returned demonstration;Verbalized understanding              Breast Clinic Goals - Jul 24, 2015 1403    Patient will be able to verbalize understanding of pertinent lymphedema risk reduction practices relevant to her diagnosis specifically related to skin care.   Time 1   Period Days   Status Achieved   Patient will be able to return demonstrate and/or verbalize understanding of the post-op home exercise program related to regaining shoulder range of motion.   Time 1   Period Days   Status Achieved   Patient will be able to verbalize understanding of the importance of attending the postoperative After Breast Cancer Class for further lymphedema risk reduction education and therapeutic exercise.   Time  1   Period Days   Status Achieved              Plan - 07/24/2015 1359    Clinical Impression Statement Patient was diagnosed 06/15/15 with right invasive and DCIS breast cancer.  It is grade 1, ER/PR positive, HER2 negative, and measures 4 mm in the lower outer quadrant.  She is planning to have a right lumpectomy and sentinel node biopsy followed by radiation and anti-estrogen therapy.  She may benefit from post op PT to regain shoulder ROM and strength and also address her neck pain which interferes with right shoulder ROM.   Pt will benefit from skilled therapeutic intervention in order to improve on the following deficits Decreased strength;Decreased knowledge of precautions;Pain;Impaired UE functional use;Decreased range of motion   Rehab Potential Excellent   Clinical Impairments Affecting Rehab Potential Neck pain referring to right shoulder   PT Frequency One time visit   PT Treatment/Interventions Therapeutic exercise;Patient/family education   PT Next Visit Plan Will f/u after surgery   PT Home Exercise Plan Post op shoulder ROM HEP   Consulted and Agree with Plan of Care Patient;Family member/caregiver   Family Member Consulted husband          G-Codes - July 24, 2015 1403    Functional Assessment Tool Used Clinical Judgement   Functional Limitation Other PT primary   Other PT Primary Current Status [phone]) At least 1 percent but less than 20 percent impaired, limited or restricted   Other PT Primary Goal Status (I1443)  At least 1 percent but less than 20 percent impaired, limited or restricted   Other PT Primary Discharge Status [phone]) At least 1 percent but less than 20 percent impaired, limited or restricted     Patient will follow up at outpatient cancer rehab if needed following surgery.  If the patient requires physical therapy at that time, a specific plan will be dictated and sent to the referring physician for approval. The patient was educated today on appropriate  basic range of motion exercises to begin post operatively and the importance of attending the After Breast Cancer class following surgery.  Patient was educated today on lymphedema risk reduction practices as it pertains to recommendations that will benefit the patient immediately following surgery.  She verbalized good understanding.  No additional physical therapy is indicated at this time.     Problem List Patient Active Problem List   Diagnosis Date Noted  . Breast cancer of lower-outer quadrant of right female breast ([place]) 07/02/2015  . OA (osteoarthritis) of knee 03/23/2014  . Paroxysmal atrial fibrillation ([place]) 03/06/2014  . Essential hypertension 03/06/2014  . Hypercholesterolemia 03/06/2014  . Osteoarthritis 03/06/2014  . Migraine headache 03/06/2014   Annia Friendly, PT 07/07/2015 2:05 PM  [place], [place], [address] Phone: [phone]   Fax:  [phone]  Name: Brandi Rodgers MRN: [id number] Date of Birth: 1951/03/24

## 2015-07-09 ENCOUNTER — Telehealth: Payer: Self-pay | Admitting: Cardiology

## 2015-07-09 ENCOUNTER — Encounter: Payer: Self-pay | Admitting: *Deleted

## 2015-07-09 ENCOUNTER — Encounter (HOSPITAL_BASED_OUTPATIENT_CLINIC_OR_DEPARTMENT_OTHER): Payer: Self-pay | Admitting: *Deleted

## 2015-07-09 ENCOUNTER — Other Ambulatory Visit: Payer: Self-pay | Admitting: General Surgery

## 2015-07-09 DIAGNOSIS — C50911 Malignant neoplasm of unspecified site of right female breast: Secondary | ICD-10-CM

## 2015-07-09 NOTE — Telephone Encounter (Signed)
Request for surgical clearance:  1. What type of surgery is being performed? Breast Cancer Surgery-Mastectomy   2. When is this surgery scheduled? 07-16-15   3. Are there any medications that need to be held prior to surgery and how long?Eliquis-how long does she need to stop and when   4. Name of physician performing surgery?Dr [name]   5. What is your office phone and fax number? [phone]-Fax#[phone]-Att:Kathy  6.

## 2015-07-09 NOTE — Telephone Encounter (Signed)
The patient is cleared for breast surgery.  She will hold her Apixaban 3 days before surgery

## 2015-07-09 NOTE — Telephone Encounter (Signed)
Faxed to [employer] [facility]

## 2015-07-09 NOTE — Telephone Encounter (Signed)
Will forward to  Dr. Brackbill for review 

## 2015-07-12 ENCOUNTER — Other Ambulatory Visit: Payer: Self-pay | Admitting: General Surgery

## 2015-07-12 ENCOUNTER — Telehealth: Payer: Self-pay | Admitting: *Deleted

## 2015-07-12 DIAGNOSIS — C50511 Malignant neoplasm of lower-outer quadrant of right female breast: Secondary | ICD-10-CM

## 2015-07-12 NOTE — Telephone Encounter (Signed)
Left vm for pt to return call regarding [place] from 07/07/15. Contact information provided.

## 2015-07-12 NOTE — Telephone Encounter (Signed)
  Oncology Nurse Navigator Documentation    Navigator Encounter Type: Telephone (07/12/15 1300) Telephone: Clinic/[facility] Follow-up (07/12/15 1300)     Surgery Date: 07/16/15 (07/12/15 1300) Treatment Initiated Date: 07/16/15 (07/12/15 1300)                                Time Spent with Patient: 15 (07/12/15 1300)

## 2015-07-13 ENCOUNTER — Telehealth: Payer: Self-pay | Admitting: Hematology and Oncology

## 2015-07-13 NOTE — Telephone Encounter (Signed)
Left message for patient re 3/13 f/u. Schedule mailed

## 2015-07-14 NOTE — H&P (Signed)
Brandi Rodgers  Location: [facility] Patient #: [id number] DOB: 12/14/1950 Undefined / Language: Brandi Rodgers / Race: White Female       History of Present Illness       The patient is a 65 year old female who presents with breast cancer. This is a very pleasant 65 year old Caucasian female from [facility]. She is here with her husband. She is referred by Dr. Emmit Pomfret at [facility] for evaluation and management of a small invasive ductal carcinoma of the right breast, lower outer quadrant. She is being seen in the [facility] today by Dr. Payton Mccallum, Dr. Sondra Come and me. Brandi Rodgers is her PCP. Dr. Toney Rakes is her gynecologist. Dr. Mare Ferrari is her cardiologist.      Focused imaging studies showed a 4 mm mass inside a 1.5 cm area of calcifications in the lower outer quadrant of the right breast, posterior depth. Image guided biopsy shows invasive ductal carcinoma and DCIS. ER 95%. PR 90%, Ki-67 15%. HER-2 negative. Clinical stage T1a.      Comorbidities include paroxysmal atrial fibrillation on eliquis, hypertension, obstructive sleep apnea,Hyperlipidemia. History of bilateral total knee replacements. Obesity. Family history is negative for breast, ovarian, or pancreatic cancer. Mother died of COPD, lung cancer and coronary disease. Father died of COPD The patient is married. She is a retired Automotive engineer. They have 1 child. Denies tobacco. Her husband is with her today.      We spent a long time discussing treatment of breast cancer in general and surgical options specifically. We did talk about mastectomy, lumpectomy, sentinel node biopsy, plastic surgical reconstruction. She is a good candidate for breast conservation and she would like that and that is her preference. She agrees to adjuvant radiation therapy. She'll be scheduled for right breast lumpectomy with radioactive seed localization and right axillary sentinel node biopsy. I  discussed the indications, details, techniques, and numerous risk of surgery with her and her husband. They're aware the risk of bleeding, infection, cosmetic deformity, second operation for positive margins or positive nodes, skin necrosis, nerve damage with chronic pain, low but measurable incidence of arm swelling or arm numbness. She understands all these issues. All of her questions are answered. She agrees with this plan. My office will call her tomorrow to begin the scheduling process.       We will ask Dr. Mare Ferrari to provide cardiac clearance and permission to stop the eliquis 3-4 days preop   Addendum Note(Clayton Jarmon M. Dalbert Batman MD;  We have received cardiac clearance and permission to discontinue eliquis 3 days preop   Other Problems  Arthritis Atrial Fibrillation Back Pain Gastroesophageal Reflux Disease Hemorrhoids Melanoma Migraine Headache Sleep Apnea  Past Surgical History  Colon Polyp Removal - Colonoscopy Knee Surgery Bilateral.  Diagnostic Studies History  Colonoscopy 1-5 years ago Mammogram within last year Pap Smear 1-5 years ago  Medication History  No Current Medications Medications Reconciled  Social History  Alcohol use Occasional alcohol use. Caffeine use Carbonated beverages, Tea. No drug use Tobacco use Never smoker.  Family History   Arthritis Mother. Colon Polyps Brother. Heart Disease Mother. Hypertension Mother. Ischemic Bowel Disease Brother. Respiratory Condition Father, Mother. Thyroid problems Mother.  Pregnancy / Birth History  Age at menarche 54 years. Age of menopause 69-50 Contraceptive History Oral contraceptives. Gravida 4 Maternal age 15-40 Para 1    Review of Systems  General Present- Night Sweats. Not Present- Appetite Loss, Chills, Fatigue, Fever, Weight Gain and Weight Loss. Skin Not Present- Change in  Wart/Mole, Dryness, Hives, Jaundice, New Lesions, Non-Healing Wounds, Rash and  Ulcer. HEENT Present- Ringing in the Ears and Wears glasses/contact lenses. Not Present- Earache, Hearing Loss, Hoarseness, Nose Bleed, Oral Ulcers, Seasonal Allergies, Sinus Pain, Sore Throat, Visual Disturbances and Yellow Eyes. Respiratory Present- Snoring. Not Present- Bloody sputum, Chronic Cough, Difficulty Breathing and Wheezing. Cardiovascular Present- Rapid Heart Rate. Not Present- Chest Pain, Difficulty Breathing Lying Down, Leg Cramps, Palpitations, Shortness of Breath and Swelling of Extremities. Gastrointestinal Present- Hemorrhoids and Indigestion. Not Present- Abdominal Pain, Bloating, Bloody Stool, Change in Bowel Habits, Chronic diarrhea, Constipation, Difficulty Swallowing, Excessive gas, Gets full quickly at meals, Nausea, Rectal Pain and Vomiting. Female Genitourinary Not Present- Frequency, Nocturia, Painful Urination, Pelvic Pain and Urgency. Musculoskeletal Present- Back Pain, Joint Pain, Joint Stiffness and Muscle Pain. Not Present- Muscle Weakness and Swelling of Extremities. Neurological Present- Headaches and Numbness. Not Present- Decreased Memory, Fainting, Seizures, Tingling, Tremor, Trouble walking and Weakness. Psychiatric Not Present- Anxiety, Bipolar, Change in Sleep Pattern, Depression, Fearful and Frequent crying. Endocrine Present- Hot flashes. Not Present- Cold Intolerance, Excessive Hunger, Hair Changes, Heat Intolerance and New Diabetes. Hematology Not Present- Easy Bruising, Excessive bleeding, Gland problems, HIV and Persistent Infections.   Physical Exam  General Mental Status-Alert. General Appearance-Consistent with stated age. Hydration-Well hydrated. Voice-Normal. Note: Obesity   Head and Neck Head-normocephalic, atraumatic with no lesions or palpable masses. Trachea-midline. Thyroid Gland Characteristics - normal size and consistency.  Eye Eyeball - Bilateral-Extraocular movements intact. Sclera/Conjunctiva - Bilateral-No  scleral icterus.  Chest and Lung Exam Chest and lung exam reveals -quiet, even and easy respiratory effort with no use of accessory muscles and on auscultation, normal breath sounds, no adventitious sounds and normal vocal resonance. Inspection Chest Wall - Normal. Back - normal.  Breast Note: Breasts are medium sized. Tiny bruise and tiny hematoma right breast lower outer quadrant. No other skin change. No other mass. Nipple and areola looked normal without drainage. No axillary adenopathy.   Cardiovascular Cardiovascular examination reveals -normal heart sounds, regular rate and rhythm with no murmurs and normal pedal pulses bilaterally.  Abdomen Inspection Inspection of the abdomen reveals - No Hernias. Skin - Scar - no surgical scars. Palpation/Percussion Palpation and Percussion of the abdomen reveal - Soft, Non Tender, No Rebound tenderness, No Rigidity (guarding) and No hepatosplenomegaly. Auscultation Auscultation of the abdomen reveals - Bowel sounds normal.  Neurologic Neurologic evaluation reveals -alert and oriented x 3 with no impairment of recent or remote memory. Mental Status-Normal.  Musculoskeletal Normal Exam - Left-Upper Extremity Strength Normal and Lower Extremity Strength Normal. Normal Exam - Right-Upper Extremity Strength Normal and Lower Extremity Strength Normal.  Lymphatic Head & Neck  General Head & Neck Lymphatics: Bilateral - Description - Normal. Axillary  General Axillary Region: Bilateral - Description - Normal. Tenderness - Non Tender. Femoral & Inguinal  Generalized Femoral & Inguinal Lymphatics: Bilateral - Description - Normal. Tenderness - Non Tender.    Assessment & Plan BREAST CANCER OF LOWER-OUTER QUADRANT OF RIGHT FEMALE BREAST (C50.511)    Your imaging studies and recent biopsies show that you have a small invasive ductal carcinoma of the right breast in the lower outer quadrant. Clinically this is a stage I  cancer. We have discussed options for surgical management including lumpectomy, sentinel node biopsy, mastectomy, immediate or delayed plastic surgical reconstruction. We have discussed adjuvant therapy such as antiestrogen therapy and radiation therapy We have decided to proceed with a right breast lumpectomy with radioactive seed localization and right axillary sentinel node biopsy  We have discussed the indications, techniques, and numerous risk of the surgery Please read the patient information that we have given you Dr. Darrel Hoover office will call you tomorrow to begin the scheduling process for the lumpectomy and sentinel node biopsy and placement of the radioactive seed preop.  PAROXYSMAL ATRIAL FIBRILLATION (I48.0) HYPERTENSION, BENIGN (I10) SLEEP APNEA IN ADULT (G47.30) HISTORY OF KNEE REPLACEMENT, TOTAL, BILATERAL (W59.102) ANTICOAGULATED (Z79.01) Impression: eliquis. Followed by Dr. Mare Ferrari OBESITY, MILD (E66.9)    Edsel Petrin. Dalbert Batman, M.D., [facility] General and Minimally invasive Surgery Breast and Colorectal Surgery Office:   [phone] Pager:   [phone]

## 2015-07-16 ENCOUNTER — Encounter (HOSPITAL_BASED_OUTPATIENT_CLINIC_OR_DEPARTMENT_OTHER): Payer: Self-pay | Admitting: Certified Registered"

## 2015-07-16 ENCOUNTER — Ambulatory Visit (HOSPITAL_COMMUNITY)
Admission: RE | Admit: 2015-07-16 | Discharge: 2015-07-16 | Disposition: A | Discharge status: Home / Self Care | Payer: Medicare Other | Source: Ambulatory Visit | Attending: General Surgery | Admitting: General Surgery

## 2015-07-16 ENCOUNTER — Ambulatory Visit (HOSPITAL_BASED_OUTPATIENT_CLINIC_OR_DEPARTMENT_OTHER): Payer: Medicare Other | Admitting: Certified Registered"

## 2015-07-16 ENCOUNTER — Telehealth: Payer: Self-pay | Admitting: *Deleted

## 2015-07-16 ENCOUNTER — Ambulatory Visit (HOSPITAL_BASED_OUTPATIENT_CLINIC_OR_DEPARTMENT_OTHER)
Admission: RE | Admit: 2015-07-16 | Discharge: 2015-07-16 | Disposition: A | Discharge status: Home Health | Payer: Medicare Other | Source: Ambulatory Visit | Attending: General Surgery | Admitting: General Surgery

## 2015-07-16 ENCOUNTER — Encounter (HOSPITAL_BASED_OUTPATIENT_CLINIC_OR_DEPARTMENT_OTHER)
Admission: RE | Disposition: A | Discharge status: Home Health | Payer: Self-pay | Source: Ambulatory Visit | Attending: General Surgery

## 2015-07-16 DIAGNOSIS — Z79899 Other long term (current) drug therapy: Secondary | ICD-10-CM | POA: Insufficient documentation

## 2015-07-16 DIAGNOSIS — Z85820 Personal history of malignant melanoma of skin: Secondary | ICD-10-CM | POA: Insufficient documentation

## 2015-07-16 DIAGNOSIS — C50511 Malignant neoplasm of lower-outer quadrant of right female breast: Secondary | ICD-10-CM | POA: Diagnosis not present

## 2015-07-16 DIAGNOSIS — I480 Paroxysmal atrial fibrillation: Secondary | ICD-10-CM | POA: Insufficient documentation

## 2015-07-16 DIAGNOSIS — I10 Essential (primary) hypertension: Secondary | ICD-10-CM | POA: Insufficient documentation

## 2015-07-16 DIAGNOSIS — E785 Hyperlipidemia, unspecified: Secondary | ICD-10-CM | POA: Insufficient documentation

## 2015-07-16 DIAGNOSIS — Z96653 Presence of artificial knee joint, bilateral: Secondary | ICD-10-CM | POA: Diagnosis not present

## 2015-07-16 DIAGNOSIS — M1990 Unspecified osteoarthritis, unspecified site: Secondary | ICD-10-CM | POA: Diagnosis not present

## 2015-07-16 DIAGNOSIS — C50911 Malignant neoplasm of unspecified site of right female breast: Secondary | ICD-10-CM

## 2015-07-16 DIAGNOSIS — Z6841 Body Mass Index (BMI) 40.0 and over, adult: Secondary | ICD-10-CM | POA: Insufficient documentation

## 2015-07-16 DIAGNOSIS — G4733 Obstructive sleep apnea (adult) (pediatric): Secondary | ICD-10-CM | POA: Insufficient documentation

## 2015-07-16 DIAGNOSIS — Z170 Estrogen receptor positive status [ER+]: Secondary | ICD-10-CM | POA: Insufficient documentation

## 2015-07-16 DIAGNOSIS — Z7901 Long term (current) use of anticoagulants: Secondary | ICD-10-CM | POA: Insufficient documentation

## 2015-07-16 DIAGNOSIS — K219 Gastro-esophageal reflux disease without esophagitis: Secondary | ICD-10-CM | POA: Insufficient documentation

## 2015-07-16 DIAGNOSIS — R51 Headache: Secondary | ICD-10-CM | POA: Insufficient documentation

## 2015-07-16 HISTORY — DX: Migraine, unspecified, not intractable, without status migrainosus: G43.909

## 2015-07-16 HISTORY — DX: Dental restoration status: Z98.811

## 2015-07-16 HISTORY — DX: Unspecified osteoarthritis, unspecified site: M19.90

## 2015-07-16 HISTORY — DX: Other symptoms and signs involving the musculoskeletal system: R29.898

## 2015-07-16 HISTORY — PX: RADIOACTIVE SEED GUIDED PARTIAL MASTECTOMY WITH AXILLARY SENTINEL LYMPH NODE BIOPSY: SHX6520

## 2015-07-16 HISTORY — DX: Paroxysmal atrial fibrillation: I48.0

## 2015-07-16 SURGERY — RADIOACTIVE SEED GUIDED PARTIAL MASTECTOMY WITH AXILLARY SENTINEL LYMPH NODE BIOPSY
Anesthesia: Regional | Site: Breast | Laterality: Right

## 2015-07-16 MED ORDER — PROPOFOL 500 MG/50ML IV EMUL
INTRAVENOUS | Status: AC
  Filled 2015-07-16: qty 50

## 2015-07-16 MED ORDER — LACTATED RINGERS IV SOLN
INTRAVENOUS | Status: DC
  Administered 2015-07-16 (×2): via INTRAVENOUS

## 2015-07-16 MED ORDER — ATROPINE SULFATE 0.4 MG/ML IJ SOLN
INTRAMUSCULAR | Status: AC
  Filled 2015-07-16: qty 1

## 2015-07-16 MED ORDER — DEXTROSE 5 % IV SOLN: 3.0000 g | INTRAVENOUS | Status: DC

## 2015-07-16 MED ORDER — HYDROMORPHONE HCL 1 MG/ML IJ SOLN
0.2500 mg | INTRAMUSCULAR | Status: DC | PRN
  Administered 2015-07-16: 0.25 mg via INTRAVENOUS

## 2015-07-16 MED ORDER — CHLORHEXIDINE GLUCONATE 4 % EX LIQD: 1.0000 "application " | Freq: Once | CUTANEOUS | Status: DC

## 2015-07-16 MED ORDER — ONDANSETRON HCL 4 MG/2ML IJ SOLN
INTRAMUSCULAR | Status: AC
  Filled 2015-07-16: qty 2

## 2015-07-16 MED ORDER — CEFAZOLIN SODIUM-DEXTROSE 2-3 GM-% IV SOLR
INTRAVENOUS | Status: AC
  Filled 2015-07-16: qty 50

## 2015-07-16 MED ORDER — HYDROMORPHONE HCL 1 MG/ML IJ SOLN
INTRAMUSCULAR | Status: AC
  Filled 2015-07-16: qty 1

## 2015-07-16 MED ORDER — LIDOCAINE HCL (CARDIAC) 20 MG/ML IV SOLN
INTRAVENOUS | Status: AC
  Filled 2015-07-16: qty 5

## 2015-07-16 MED ORDER — EPHEDRINE SULFATE 50 MG/ML IJ SOLN
INTRAMUSCULAR | Status: DC | PRN
  Administered 2015-07-16: 10 mg via INTRAVENOUS

## 2015-07-16 MED ORDER — FENTANYL CITRATE (PF) 100 MCG/2ML IJ SOLN
50.0000 ug | INTRAMUSCULAR | Status: DC | PRN
  Administered 2015-07-16: 100 ug via INTRAVENOUS

## 2015-07-16 MED ORDER — BUPIVACAINE-EPINEPHRINE (PF) 0.5% -1:200000 IJ SOLN
INTRAMUSCULAR | Status: DC | PRN
  Administered 2015-07-16: 15 mL

## 2015-07-16 MED ORDER — SCOPOLAMINE 1 MG/3DAYS TD PT72: 1.0000 | MEDICATED_PATCH | Freq: Once | TRANSDERMAL | Status: DC | PRN

## 2015-07-16 MED ORDER — PROMETHAZINE HCL 25 MG/ML IJ SOLN: 6.2500 mg | INTRAMUSCULAR | Status: DC | PRN

## 2015-07-16 MED ORDER — LIDOCAINE HCL (CARDIAC) 20 MG/ML IV SOLN
INTRAVENOUS | Status: DC | PRN
  Administered 2015-07-16: 30 mg via INTRAVENOUS

## 2015-07-16 MED ORDER — TECHNETIUM TC 99M SULFUR COLLOID FILTERED
1.0000 | Freq: Once | INTRAVENOUS | Status: AC | PRN
  Administered 2015-07-16: 1 via INTRADERMAL

## 2015-07-16 MED ORDER — PROPOFOL 10 MG/ML IV BOLUS
INTRAVENOUS | Status: DC | PRN
  Administered 2015-07-16: 150 mg via INTRAVENOUS

## 2015-07-16 MED ORDER — DEXAMETHASONE SODIUM PHOSPHATE 4 MG/ML IJ SOLN
INTRAMUSCULAR | Status: DC | PRN
  Administered 2015-07-16: 10 mg via INTRAVENOUS

## 2015-07-16 MED ORDER — HYDROCODONE-ACETAMINOPHEN 7.5-325 MG PO TABS: 1.0000 | ORAL_TABLET | Freq: Once | ORAL | Status: DC | PRN

## 2015-07-16 MED ORDER — DEXAMETHASONE SODIUM PHOSPHATE 10 MG/ML IJ SOLN
INTRAMUSCULAR | Status: AC
Start: 2015-07-16 — End: 2015-07-16
  Filled 2015-07-16: qty 1

## 2015-07-16 MED ORDER — EPHEDRINE SULFATE 50 MG/ML IJ SOLN
INTRAMUSCULAR | Status: AC
  Filled 2015-07-16: qty 1

## 2015-07-16 MED ORDER — METHYLENE BLUE 0.5 % INJ SOLN
INTRAVENOUS | Status: DC | PRN
  Administered 2015-07-16: 5 mL

## 2015-07-16 MED ORDER — FENTANYL CITRATE (PF) 100 MCG/2ML IJ SOLN
INTRAMUSCULAR | Status: AC
  Filled 2015-07-16: qty 2

## 2015-07-16 MED ORDER — GLYCOPYRROLATE 0.2 MG/ML IJ SOLN: 0.2000 mg | Freq: Once | INTRAMUSCULAR | Status: DC | PRN

## 2015-07-16 MED ORDER — ONDANSETRON HCL 4 MG/2ML IJ SOLN
INTRAMUSCULAR | Status: DC | PRN
  Administered 2015-07-16: 4 mg via INTRAVENOUS

## 2015-07-16 MED ORDER — 0.9 % SODIUM CHLORIDE (POUR BTL) OPTIME
TOPICAL | Status: DC | PRN
  Administered 2015-07-16: 1000 mL

## 2015-07-16 MED ORDER — DEXAMETHASONE SODIUM PHOSPHATE 10 MG/ML IJ SOLN
INTRAMUSCULAR | Status: AC
  Filled 2015-07-16: qty 1

## 2015-07-16 MED ORDER — CEFAZOLIN SODIUM-DEXTROSE 2-3 GM-% IV SOLR: 2.0000 g | INTRAVENOUS | Status: DC

## 2015-07-16 MED ORDER — HYDROCODONE-ACETAMINOPHEN 5-325 MG PO TABS: 1.0000 | ORAL_TABLET | Freq: Four times a day (QID) | ORAL | Status: DC | PRN

## 2015-07-16 MED ORDER — MIDAZOLAM HCL 2 MG/2ML IJ SOLN
INTRAMUSCULAR | Status: AC
  Filled 2015-07-16: qty 2

## 2015-07-16 MED ORDER — CEFAZOLIN SODIUM-DEXTROSE 2-3 GM-% IV SOLR
2.0000 g | INTRAVENOUS | Status: AC
  Administered 2015-07-16: 2 g via INTRAVENOUS

## 2015-07-16 MED ORDER — MIDAZOLAM HCL 2 MG/2ML IJ SOLN
1.0000 mg | INTRAMUSCULAR | Status: DC | PRN
  Administered 2015-07-16: 2 mg via INTRAVENOUS

## 2015-07-16 SURGICAL SUPPLY — 59 items
APPLIER CLIP 9.375 MED OPEN (MISCELLANEOUS) ×2
BENZOIN TINCTURE PRP APPL 2/3 (GAUZE/BANDAGES/DRESSINGS) IMPLANT
BINDER BREAST LRG (GAUZE/BANDAGES/DRESSINGS) IMPLANT
BINDER BREAST MEDIUM (GAUZE/BANDAGES/DRESSINGS) IMPLANT
BINDER BREAST XLRG (GAUZE/BANDAGES/DRESSINGS) IMPLANT
BINDER BREAST XXLRG (GAUZE/BANDAGES/DRESSINGS) ×2 IMPLANT
BLADE HEX COATED 2.75 (ELECTRODE) ×2 IMPLANT
BLADE SURG 10 STRL SS (BLADE) IMPLANT
BLADE SURG 15 STRL LF DISP TIS (BLADE) ×1 IMPLANT
BLADE SURG 15 STRL SS (BLADE) ×1
CANISTER SUC SOCK COL 7IN (MISCELLANEOUS) IMPLANT
CANISTER SUCT 1200ML W/VALVE (MISCELLANEOUS) ×2 IMPLANT
CHLORAPREP W/TINT 26ML (MISCELLANEOUS) ×2 IMPLANT
CLIP APPLIE 9.375 MED OPEN (MISCELLANEOUS) ×1 IMPLANT
COVER BACK TABLE 60X90IN (DRAPES) ×2 IMPLANT
COVER MAYO STAND STRL (DRAPES) ×2 IMPLANT
COVER PROBE W GEL 5X96 (DRAPES) ×2 IMPLANT
DECANTER SPIKE VIAL GLASS SM (MISCELLANEOUS) IMPLANT
DERMABOND ADVANCED (GAUZE/BANDAGES/DRESSINGS) ×1
DERMABOND ADVANCED .7 DNX12 (GAUZE/BANDAGES/DRESSINGS) ×1 IMPLANT
DEVICE DUBIN W/COMP PLATE 8390 (MISCELLANEOUS) ×2 IMPLANT
DRAPE LAPAROSCOPIC ABDOMINAL (DRAPES) ×2 IMPLANT
DRAPE UTILITY XL STRL (DRAPES) ×2 IMPLANT
DRSG PAD ABDOMINAL 8X10 ST (GAUZE/BANDAGES/DRESSINGS) ×2 IMPLANT
ELECT REM PT RETURN 9FT ADLT (ELECTROSURGICAL) ×2
ELECTRODE REM PT RTRN 9FT ADLT (ELECTROSURGICAL) ×1 IMPLANT
GAUZE SPONGE 4X4 12PLY STRL (GAUZE/BANDAGES/DRESSINGS) IMPLANT
GLOVE BIOGEL M STRL SZ7.5 (GLOVE) ×2 IMPLANT
GLOVE BIOGEL PI IND STRL 8 (GLOVE) ×1 IMPLANT
GLOVE BIOGEL PI INDICATOR 8 (GLOVE) ×1
GLOVE EUDERMIC 7 POWDERFREE (GLOVE) ×4 IMPLANT
GOWN STRL REUS W/ TWL LRG LVL3 (GOWN DISPOSABLE) ×2 IMPLANT
GOWN STRL REUS W/ TWL XL LVL3 (GOWN DISPOSABLE) ×1 IMPLANT
GOWN STRL REUS W/TWL LRG LVL3 (GOWN DISPOSABLE) ×2
GOWN STRL REUS W/TWL XL LVL3 (GOWN DISPOSABLE) ×1
KIT MARKER MARGIN INK (KITS) ×2 IMPLANT
NDL SAFETY ECLIPSE 18X1.5 (NEEDLE) ×1 IMPLANT
NEEDLE HYPO 18GX1.5 SHARP (NEEDLE) ×1
NEEDLE HYPO 25X1 1.5 SAFETY (NEEDLE) ×4 IMPLANT
NS IRRIG 1000ML POUR BTL (IV SOLUTION) ×2 IMPLANT
PACK BASIN DAY SURGERY FS (CUSTOM PROCEDURE TRAY) ×2 IMPLANT
PENCIL BUTTON HOLSTER BLD 10FT (ELECTRODE) ×2 IMPLANT
SHEET MEDIUM DRAPE 40X70 STRL (DRAPES) ×2 IMPLANT
SLEEVE SCD COMPRESS KNEE MED (MISCELLANEOUS) ×2 IMPLANT
SPONGE LAP 18X18 X RAY DECT (DISPOSABLE) IMPLANT
SPONGE LAP 4X18 X RAY DECT (DISPOSABLE) ×2 IMPLANT
STRIP CLOSURE SKIN 1/2X4 (GAUZE/BANDAGES/DRESSINGS) IMPLANT
SUT MNCRL AB 4-0 PS2 18 (SUTURE) ×4 IMPLANT
SUT SILK 2 0 SH (SUTURE) ×2 IMPLANT
SUT VIC AB 2-0 CT1 27 (SUTURE)
SUT VIC AB 2-0 CT1 TAPERPNT 27 (SUTURE) IMPLANT
SUT VIC AB 3-0 SH 27 (SUTURE)
SUT VIC AB 3-0 SH 27X BRD (SUTURE) IMPLANT
SUT VICRYL 3-0 CR8 SH (SUTURE) ×4 IMPLANT
SYRINGE 10CC LL (SYRINGE) ×4 IMPLANT
TOWEL OR 17X24 6PK STRL BLUE (TOWEL DISPOSABLE) ×2 IMPLANT
TOWEL OR NON WOVEN STRL DISP B (DISPOSABLE) ×2 IMPLANT
TUBE CONNECTING 20X1/4 (TUBING) ×2 IMPLANT
YANKAUER SUCT BULB TIP NO VENT (SUCTIONS) ×2 IMPLANT

## 2015-07-16 NOTE — Anesthesia Procedure Notes (Addendum)
Anesthesia Regional Block:  Pectoralis block  Pre-Anesthetic Checklist: ,, timeout performed, Correct Patient, Correct Site, Correct Laterality, Correct Procedure, Correct Position, site marked, Risks and benefits discussed,  Surgical consent,  Pre-op evaluation,  At surgeon's request and post-op pain management  Laterality: Right  Prep: chloraprep       Needles:   Needle Type: Echogenic Needle     Needle Length: 9cm 9 cm Needle Gauge: 21 and 21 G    Additional Needles:  Procedures: ultrasound guided (picture in chart) Pectoralis block Narrative:  Start time: 07/16/2015 8:27 AM End time: 07/16/2015 8:35 AM Injection made incrementally with aspirations every 5 mL.  Performed by: Personally  Anesthesiologist: Suzette Battiest  Additional Notes: Risks and benefits discussed. Pt tolerated well with no immediate complications.   Procedure Name: LMA Insertion Date/Time: 07/16/2015 8:50 AM Performed by: Lyndee Leo Pre-anesthesia Checklist: Patient identified, Emergency Drugs available, Suction available and Patient being monitored Patient Re-evaluated:Patient Re-evaluated prior to inductionOxygen Delivery Method: Circle System Utilized Preoxygenation: Pre-oxygenation with 100% oxygen Intubation Type: IV induction Ventilation: Mask ventilation without difficulty LMA: LMA with gastric port inserted LMA Size: 4.0 Number of attempts: 1 Placement Confirmation: positive ETCO2 Tube secured with: Tape Dental Injury: Teeth and Oropharynx as per pre-operative assessment

## 2015-07-16 NOTE — Telephone Encounter (Signed)
Patient called and stated that [employer] informed her that the eliquis requires a prior auth. The number that she provided to call was [phone] or log onto [email or web address] to print forms.

## 2015-07-16 NOTE — Progress Notes (Signed)
Assisted Dr. Rob Fitzgerald with right, ultrasound guided, pectoralis block. Side rails up, monitors on throughout procedure. See vital signs in flow sheet. Tolerated Procedure well. 

## 2015-07-16 NOTE — Discharge Instructions (Signed)
[facility] °Office Phone Number [phone] ° °BREAST BIOPSY/ PARTIAL MASTECTOMY: POST OP INSTRUCTIONS ° °Always review your discharge instruction sheet given to you by the facility where your surgery was performed. ° °IF YOU HAVE DISABILITY OR FAMILY LEAVE FORMS, YOU MUST BRING THEM TO THE OFFICE FOR PROCESSING.  DO NOT GIVE THEM TO YOUR DOCTOR. ° °1. A prescription for pain medication may be given to you upon discharge.  Take your pain medication as prescribed, if needed.  If narcotic pain medicine is not needed, then you may take acetaminophen (Tylenol) or ibuprofen (Advil) as needed. °2. Take your usually prescribed medications unless otherwise directed °3. If you need a refill on your pain medication, please contact your pharmacy.  They will contact our office to request authorization.  Prescriptions will not be filled after 5pm or on week-ends. °4. You should eat very light the first 24 hours after surgery, such as soup, crackers, pudding, etc.  Resume your normal diet the day after surgery. °5. Most patients will experience some swelling and bruising in the breast.  Ice packs and a good support bra will help.  Swelling and bruising can take several days to resolve.  °6. It is common to experience some constipation if taking pain medication after surgery.  Increasing fluid intake and taking a stool softener will usually help or prevent this problem from occurring.  A mild laxative (Milk of Magnesia or Miralax) should be taken according to package directions if there are no bowel movements after 48 hours. °7. Unless discharge instructions indicate otherwise, you may remove your bandages 24-48 hours after surgery, and you may shower at that time.  You may have steri-strips (small skin tapes) in place directly over the incision.  These strips should be left on the skin for 7-10 days.  If your surgeon used skin glue on the incision, you may shower in 24 hours.  The glue will flake off over the  next 2-3 weeks.  Any sutures or staples will be removed at the office during your follow-up visit. °8. ACTIVITIES:  You may resume regular daily activities (gradually increasing) beginning the next day.  Wearing a good support bra or sports bra minimizes pain and swelling.  You may have sexual intercourse when it is comfortable. °a. You may drive when you no longer are taking prescription pain medication, you can comfortably wear a seatbelt, and you can safely maneuver your car and apply brakes. °b. RETURN TO WORK:  ______________________________________________________________________________________ °9. You should see your doctor in the office for a follow-up appointment approximately two weeks after your surgery.  Your doctor’s nurse will typically make your follow-up appointment when she calls you with your pathology report.  Expect your pathology report 2-3 business days after your surgery.  You may call to check if you do not hear from us after three days. °10. OTHER INSTRUCTIONS: _______________________________________________________________________________________________ _____________________________________________________________________________________________________________________________________ °_____________________________________________________________________________________________________________________________________ °_____________________________________________________________________________________________________________________________________ ° °WHEN TO CALL YOUR DOCTOR: °1. Fever over 101.0 °2. Nausea and/or vomiting. °3. Extreme swelling or bruising. °4. Continued bleeding from incision. °5. Increased pain, redness, or drainage from the incision. ° °The clinic staff is available to answer your questions during regular business hours.  Please don’t hesitate to call and ask to speak to one of the nurses for clinical concerns.  If you have a medical emergency, go to the nearest  emergency room or call 911.  A surgeon from [facility] is always on call at the hospital. ° °For further questions, please visit [email or web address]  ° ° ° °  Post Anesthesia Home Care Instructions ° °Activity: °Get plenty of rest for the remainder of the day. A responsible adult should stay with you for 24 hours following the procedure.  °For the next 24 hours, DO NOT: °-Drive a car °-Operate machinery °-Drink alcoholic beverages °-Take any medication unless instructed by your physician °-Make any legal decisions or sign important papers. ° °Meals: °Start with liquid foods such as gelatin or soup. Progress to regular foods as tolerated. Avoid greasy, spicy, heavy foods. If nausea and/or vomiting occur, drink only clear liquids until the nausea and/or vomiting subsides. Call your physician if vomiting continues. ° °Special Instructions/Symptoms: °Your throat may feel dry or sore from the anesthesia or the breathing tube placed in your throat during surgery. If this causes discomfort, gargle with warm salt water. The discomfort should disappear within 24 hours. ° °If you had a scopolamine patch placed behind your ear for the management of post- operative nausea and/or vomiting: ° °1. The medication in the patch is effective for 72 hours, after which it should be removed.  Wrap patch in a tissue and discard in the trash. Wash hands thoroughly with soap and water. °2. You may remove the patch earlier than 72 hours if you experience unpleasant side effects which may include dry mouth, dizziness or visual disturbances. °3. Avoid touching the patch. Wash your hands with soap and water after contact with the patch. °  ° °

## 2015-07-16 NOTE — Anesthesia Preprocedure Evaluation (Addendum)
Anesthesia Evaluation  Patient identified by MRN, date of birth, ID band Patient awake    Reviewed: Allergy & Precautions, NPO status , Patient's Chart, lab work & pertinent test results  Airway Mallampati: II  TM Distance: >3 FB Neck ROM: Full    Dental  (+) Dental Advisory Given   Pulmonary sleep apnea ,    breath sounds clear to auscultation       Cardiovascular hypertension, Pt. on home beta blockers and Pt. on medications + dysrhythmias  Rhythm:Regular Rate:Normal     Neuro/Psych  Headaches,    GI/Hepatic Neg liver ROS, GERD  ,  Endo/Other  Morbid obesity  Renal/GU negative Renal ROS     Musculoskeletal  (+) Arthritis ,   Abdominal   Peds  Hematology negative hematology ROS (+)   Anesthesia Other Findings   Reproductive/Obstetrics                            Lab Results  Component Value Date   WBC 8.7 07/07/2015   HGB 12.8 07/07/2015   HCT 38.8 07/07/2015   MCV 79.4* 07/07/2015   PLT 253 07/07/2015   Lab Results  Component Value Date   CREATININE 0.8 07/07/2015   BUN 10.9 07/07/2015   NA 142 07/07/2015   K 4.0 07/07/2015   CL 103 08/23/2014   CO2 29 07/07/2015    Anesthesia Physical Anesthesia Plan  ASA: III  Anesthesia Plan: General and Regional   Post-op Pain Management: GA combined w/ Regional for post-op pain   Induction: Intravenous  Airway Management Planned: LMA  Additional Equipment:   Intra-op Plan:   Post-operative Plan: Extubation in OR  Informed Consent: I have reviewed the patients History and Physical, chart, labs and discussed the procedure including the risks, benefits and alternatives for the proposed anesthesia with the patient or authorized representative who has indicated his/her understanding and acceptance.   Dental advisory given  Plan Discussed with: CRNA  Anesthesia Plan Comments:         Anesthesia Quick Evaluation

## 2015-07-16 NOTE — Interval H&P Note (Signed)
History and Physical Interval Note:  07/16/2015 8:17 AM  Brandi Rodgers  has presented today for surgery, with the diagnosis of INVASIVE CANCER OF THE RIGHT BREAST LOWER OUTER QUADRANT IN A FEMALE  The various methods of treatment have been discussed with the patient and family. After consideration of risks, benefits and other options for treatment, the patient has consented to  Procedure(s): RADIOACTIVE SEED GUIDED PARTIAL MASTECTOMY WITH AXILLARY SENTINEL LYMPH NODE BIOPSY, INJECT BLUE DYE RIGHT BREAST (Right) as a surgical intervention .  The patient's history has been reviewed, patient examined, no change in status, stable for surgery.  I have reviewed the patient's chart and labs.  Questions were answered to the patient's satisfaction.     Adin Hector

## 2015-07-16 NOTE — Transfer of Care (Signed)
Immediate Anesthesia Transfer of Care Note  Patient: Brandi Rodgers  Procedure(s) Performed: Procedure(s): RADIOACTIVE SEED GUIDED PARTIAL MASTECTOMY WITH AXILLARY SENTINEL LYMPH NODE BIOPSY, INJECT BLUE DYE RIGHT BREAST (Right)  Patient Location: PACU  Anesthesia Type:GA combined with regional for post-op pain  Level of Consciousness: awake, sedated and patient cooperative  Airway & Oxygen Therapy: Patient Spontanous Breathing and Patient connected to face mask oxygen  Post-op Assessment: Report given to RN and Post -op Vital signs reviewed and stable  Post vital signs: Reviewed and stable  Last Vitals:  Filed Vitals:   07/16/15 0833 07/16/15 0834  BP:    Pulse: 57 56  Temp:    Resp: 14 15    Complications: No apparent anesthesia complications

## 2015-07-16 NOTE — Progress Notes (Signed)
Emotional support during breast injections °

## 2015-07-16 NOTE — Op Note (Signed)
Patient Name:           Brandi Rodgers   Date of Surgery:        07/16/2015  Pre op Diagnosis:      Invasive ductal carcinoma  Right breast, lower outer quadrant, estrogen receptor positive, HER-2 negative.  Clinical stage T1a.  Post op Diagnosis:    same  Procedure:                 Inject blue dye right breast, right breast partial mastectomy with radioactive seed localization, right axillary sentinel node biopsy  Surgeon:                     Edsel Petrin. Dalbert Batman, M.D., FACS  Assistant:                      OR staff  Operative Indications:     This is a very pleasant 65 year old Caucasian female from [facility]. She is referred by Dr. Emmit Pomfret at [facility] for evaluation and management of a small invasive ductal carcinoma of the right breast, lower outer quadrant. She is being seen in the [facility] today by Dr. Lindi Adie, Dr. Sondra Come and me. Alessandra Grout is her PCP. Dr. Toney Rakes is her gynecologist. Dr. Mare Ferrari is her cardiologist.  Focused imaging studies showed a 4 mm mass inside a 1.5 cm area of calcifications in the lower outer quadrant of the right breast, posterior depth. Image guided biopsy shows invasive ductal carcinoma and DCIS. ER 95%. PR 90%, Ki-67 15%. HER-2 negative. Clinical stage T1a.  Comorbidities include paroxysmal atrial fibrillation on eliquis, hypertension, obstructive sleep apnea,Hyperlipidemia. History of bilateral total knee replacements. Obesity. She'll be scheduled for right breast lumpectomy with radioactive seed localization and right axillary sentinel node biopsy. I discussed the indications, details, techniques, and numerous risk of surgery with her and her husband. They're aware the risk of bleeding, infection, cosmetic deformity, second operation for positive margins or positive nodes, skin necrosis, nerve damage with chronic pain, low but measurable incidence of arm swelling or arm numbness. She understands all these issues.  All of her questions are answered. She agrees with this plan.   Operative Findings:       The radioactive seed was placed at [facility] mammography a day or 2 ago.  The biopsy marker clip is adjacent to the calcifications, and the radioactive seed is adjacent to the calcifications on the other side, and the cancer is therefore between the 2.  I was able to hear the audible signal of the radioactive seed with the neoprobe in the holding area.  Technetium 99 was injected into the breast in the holding area by the nuclear medicine technician.  Pectoral block was performed by the anesthesiologist.  The specimen mammogram looked good with all of the above-described structures within the center of the specimen.  I found four sentinel lymph nodes.  Procedure in Detail:          Following the induction of general LMA anesthesia and a surgical timeout was performed.  Intravenous antibodies were given.  Following alcohol prep I injected 5 mL of blue dye into the right breast.  This was methylene blue mixed with saline.  The breast was massaged for a few minutes.     The right breast and chest wall and axilla were then prepped and draped in a sterile fashion.  0.5% Marcaine with epinephrine was used as local infiltration anesthetic into the skin and subcutaneous  tissue.  Using the neoprobe I localized the radioactive signal in the right breast.  This was at about the 6:30 position about 3 cm or more above the inframammary crease.  I made a transverse incision in the lower breast parallel to the inframammary crease.  Using the neoprobe as a guide I dissected all the breast tissue.  The lumpectomy specimen was removed and marked with silk sutures and a 6 color ink kit to orient the pathologist.  Specimen mammogram looked good as described above.  Hemostasis was excellent and achieved with  electrocautery.  After irrigating the wound I closed the breast tissues in multiple layers with interrupted 3-0 Vicryl and the skin with a  running subcuticular 4-0 Monocryl and Dermabond.     I made a transverse incision in the right axilla just below the hairline.  Dissection was carried down through subcutaneous tissue.  The clavipectoral fascia was incised.  Using a neoprobe I isolated four sentinel node lymph nodes that were very hot and somewhat blue.  One of the lymph nodes was in the retropectoral area.  After these for removed there was no more radioactivity.  The 4 sentinel lymph nodes were sent as separate specimens.  Hemostasis excellent.  Wound irrigated.  Deeper tissues were closed with 3-0 Vicryl and skin closed with a running subcuticular 4-0 Monocryl and Dermabond.  Breast binder was placed and the patient taken to PACU in stable condition.  EBL 20 mL or less.  Counts correct.  Complications none.     Edsel Petrin. Dalbert Batman, M.D., FACS General and Minimally Invasive Surgery Breast and Colorectal Surgery  07/16/2015 9:58 AM

## 2015-07-16 NOTE — Anesthesia Postprocedure Evaluation (Signed)
Anesthesia Post Note  Patient: TAYANA BADOUR  Procedure(s) Performed: Procedure(s) (LRB): RADIOACTIVE SEED GUIDED PARTIAL MASTECTOMY WITH AXILLARY SENTINEL LYMPH NODE BIOPSY (Right)  Patient location during evaluation: PACU Anesthesia Type: General and Regional Level of consciousness: awake and alert Pain management: pain level controlled Vital Signs Assessment: post-procedure vital signs reviewed and stable Respiratory status: spontaneous breathing, nonlabored ventilation, respiratory function stable and patient connected to nasal cannula oxygen Cardiovascular status: blood pressure returned to baseline and stable Postop Assessment: no signs of nausea or vomiting Anesthetic complications: no    Last Vitals:  Filed Vitals:   07/16/15 1100 07/16/15 1135  BP: 128/76 123/65  Pulse: 55 54  Temp:  36.6 C  Resp: 19 19    Last Pain:  Filed Vitals:   07/16/15 1137  PainSc: 1                  Tiajuana Amass

## 2015-07-19 ENCOUNTER — Telehealth: Payer: Self-pay

## 2015-07-19 ENCOUNTER — Encounter (HOSPITAL_BASED_OUTPATIENT_CLINIC_OR_DEPARTMENT_OTHER): Payer: Self-pay | Admitting: General Surgery

## 2015-07-19 NOTE — Telephone Encounter (Signed)
Prior auth for Eliquis 5 mg sent to [facility]. 

## 2015-07-19 NOTE — Telephone Encounter (Signed)
Sent to [facility] for approval today.  We should have an answer in 24 hours.

## 2015-07-20 ENCOUNTER — Telehealth: Payer: Self-pay

## 2015-07-20 NOTE — Telephone Encounter (Signed)
Eliquis approved through 05/14/2016. PA- [id number].

## 2015-07-24 NOTE — Assessment & Plan Note (Signed)
Rt Lumpectomy 07/16/15: IDC grade 2, 0.6 cm, with DCIS, 0/4 LN, ER 95%, PR 90% HER-2 negative ratio 1.70, KI 67:15%, Path stage: T1aN0 Stage 1A   Pathology counseling: I discussed the final pathology report of the patient provided  a copy of this report. I discussed the margins as well as lymph node surgeries. We also discussed the final staging along with previously performed ER/PR and HER-2/neu testing.  Plan: 1. Oncotype DX testing (if final tumor size is greater than 5 mm) 2. Adjuvant radiation therapy followed by 3. Adjuvant antiestrogen therapy  RTC based on Oncotyope DX result

## 2015-07-26 ENCOUNTER — Encounter: Payer: Self-pay | Admitting: Hematology and Oncology

## 2015-07-26 ENCOUNTER — Ambulatory Visit (HOSPITAL_BASED_OUTPATIENT_CLINIC_OR_DEPARTMENT_OTHER): Payer: Medicare Other | Admitting: Hematology and Oncology

## 2015-07-26 ENCOUNTER — Telehealth: Payer: Self-pay | Admitting: *Deleted

## 2015-07-26 VITALS — BP 129/58 | HR 58 | Temp 97.7°F | Resp 18 | Ht 68.0 in | Wt 275.7 lb

## 2015-07-26 DIAGNOSIS — C50511 Malignant neoplasm of lower-outer quadrant of right female breast: Secondary | ICD-10-CM | POA: Diagnosis not present

## 2015-07-26 NOTE — Progress Notes (Signed)
Patient Care Team: Jonathon Jordan, MD as PCP - General (Family Medicine) Sylvan Cheese, NP as Nurse Practitioner (Hematology and Oncology)  DIAGNOSIS: Breast cancer of lower-outer quadrant of right female breast [facility])   Staging form: Breast, AJCC 7th Edition     Clinical stage from 07/07/2015: Stage IA (T1a, N0, M0) - Unsigned   SUMMARY OF ONCOLOGIC HISTORY:   Breast cancer of lower-outer quadrant of right female breast ([place])   06/25/2015 Initial Diagnosis Right breast screening det focal asymmetry plus calcs 4 mm at 8:00 axilla neg, 3-D biopsy: Grade 1 IDC with DCIS and calcifications, ER 95%, PR 90% HER-2 negative ratio 1.70, KI 67:15%, clinical stage: T1aN0 Stage 1A    07/16/2015 Surgery Rt Lumpectomy: IDC grade 2, 0.6 cm, with DCIS, 0/4 LN, ER 95%, PR 90% HER-2 negative ratio 1.70, KI 67:15%, Path stage: T1aN0 Stage 1A     CHIEF COMPLIANT: Follow-up after right lumpectomy  INTERVAL HISTORY: Brandi Rodgers is a 65 year old lady with above-mentioned history right breast cancer underwent lumpectomy and is here today to discuss pathology report. She is doing extremely well from surgery standpoint. She has very minimal discomfort in the breast or axilla. She is taking tramadol intermittently. She is here today accompanied by her husband.  REVIEW OF SYSTEMS:   Constitutional: Denies fevers, chills or abnormal weight loss Eyes: Denies blurriness of vision Ears, nose, mouth, throat, and face: Denies mucositis or sore throat Respiratory: Denies cough, dyspnea or wheezes Cardiovascular: Denies palpitation, chest discomfort Gastrointestinal:  Denies nausea, heartburn or change in bowel habits Skin: Denies abnormal skin rashes Lymphatics: Denies new lymphadenopathy or easy bruising Neurological:Denies numbness, tingling or new weaknesses Behavioral/Psych: Mood is stable, no new changes  Extremities: No lower extremity edema Breast:  Recent right lumpectomy All other systems  were reviewed with the patient and are negative.  I have reviewed the past medical history, past surgical history, social history and family history with the patient and they are unchanged from previous note.  ALLERGIES:  is allergic to adhesive and methocarbamol.  MEDICATIONS:  Current Outpatient Prescriptions  Medication Sig Dispense Refill  . atenolol (TENORMIN) 50 MG tablet Take 50 mg by mouth daily.     [name] BIOTIN 5000 PO Take 1 tablet by mouth daily.    . Calcium-Vitamin D-Vitamin K (VIACTIV PO) Take 1 tablet by mouth daily.    [name] ELIQUIS 5 MG TABS tablet TAKE 1 TABLET (5 MG TOTAL) BY MOUTH 2 (TWO) TIMES DAILY. 180 tablet 1  . furosemide (LASIX) 20 MG tablet Take 20 mg by mouth daily as needed for fluid.     [name] gabapentin (NEURONTIN) 600 MG tablet Take 600 mg by mouth 3 (three) times daily.    . Multiple Vitamin (MULTIVITAMIN) tablet Take 1 tablet by mouth daily.    . ranitidine (ZANTAC) 75 MG tablet Take 75 mg by mouth 2 (two) times daily as needed for heartburn.     . rizatriptan (MAXALT-MLT) 10 MG disintegrating tablet Take 10 mg by mouth as needed for migraine. May repeat in 2 hours if needed     No current facility-administered medications for this visit.    PHYSICAL EXAMINATION: ECOG PERFORMANCE STATUS: 1 - Symptomatic but completely ambulatory  Filed Vitals:   07/26/15 0851  BP: 129/58  Pulse: 58  Temp: 97.7 F (36.5 C)  Resp: 18   Filed Weights   07/26/15 0851  Weight: 275 lb 11.2 oz (125.057 kg)    GENERAL:alert, no distress and comfortable SKIN: skin  color, texture, turgor are normal, no rashes or significant lesions EYES: normal, Conjunctiva are pink and non-injected, sclera clear OROPHARYNX:no exudate, no erythema and lips, buccal mucosa, and tongue normal  NECK: supple, thyroid normal size, non-tender, without nodularity LYMPH:  no palpable lymphadenopathy in the cervical, axillary or inguinal LUNGS: clear to auscultation and percussion with normal breathing  effort HEART: regular rate & rhythm and no murmurs and no lower extremity edema ABDOMEN:abdomen soft, non-tender and normal bowel sounds MUSCULOSKELETAL:no cyanosis of digits and no clubbing  NEURO: alert & oriented x 3 with fluent speech, no focal motor/sensory deficits EXTREMITIES: No lower extremity edema  LABORATORY DATA:  I have reviewed the data as listed   Chemistry      Component Value Date/Time   NA 142 07/07/2015 1226   NA 138 08/23/2014 0426   K 4.0 07/07/2015 1226   K 4.6 08/23/2014 0426   CL 103 08/23/2014 0426   CO2 29 07/07/2015 1226   CO2 30 08/23/2014 0426   BUN 10.9 07/07/2015 1226   BUN 11 08/23/2014 0426   CREATININE 0.8 07/07/2015 1226   CREATININE 0.57 08/23/2014 0426      Component Value Date/Time   CALCIUM 10.0 07/07/2015 1226   CALCIUM 8.8 08/23/2014 0426   ALKPHOS 127 07/07/2015 1226   ALKPHOS 87 08/13/2014 1020   AST 22 07/07/2015 1226   AST 42* 08/13/2014 1020   ALT 18 07/07/2015 1226   ALT 39* 08/13/2014 1020   BILITOT 0.52 07/07/2015 1226   BILITOT 0.8 08/13/2014 1020       Lab Results  Component Value Date   WBC 8.7 07/07/2015   HGB 12.8 07/07/2015   HCT 38.8 07/07/2015   MCV 79.4* 07/07/2015   PLT 253 07/07/2015   NEUTROABS 6.6* 07/07/2015     ASSESSMENT & PLAN:  Breast cancer of lower-outer quadrant of right female breast ([place]) Rt Lumpectomy 07/16/15: IDC grade 2, 0.6 cm, with DCIS, 0/4 LN, ER 95%, PR 90% HER-2 negative ratio 1.70, KI 67:15%, Path stage: T1aN0 Stage 1A   Pathology counseling: I discussed the final pathology report of the patient provided  a copy of this report. I discussed the margins as well as lymph node surgeries. We also discussed the final staging along with previously performed ER/PR and HER-2/neu testing.  Plan: 1. Oncotype DX testing  2. Adjuvant radiation therapy followed by 3. Adjuvant antiestrogen therapy  RTC based on Oncotyope DX result   No orders of the defined types were placed in this  encounter.   The patient has a good understanding of the overall plan. she agrees with it. she will call with any problems that may develop before the next visit here.   Rulon Eisenmenger, MD 07/26/2015

## 2015-07-26 NOTE — Telephone Encounter (Signed)
Ordered Oncotype Dx test for Dr. Lindi Adie.  Faxed Order form to Pathology. Added to task list.

## 2015-08-03 ENCOUNTER — Encounter (HOSPITAL_COMMUNITY): Payer: Self-pay

## 2015-08-09 ENCOUNTER — Telehealth: Payer: Self-pay | Admitting: *Deleted

## 2015-08-09 NOTE — Telephone Encounter (Signed)
Received oncotype score of 19.  Left vm for pt to return call to discuss results. Referral placed for appt with Dr. Sondra Come to discuss and start xrt.

## 2015-08-13 NOTE — Progress Notes (Signed)
Location of Breast Cancer: Breast cancer of lower-outer quadrant of right female breast   Histology per Pathology Report:   07/16/15 Diagnosis 1. Breast, lumpectomy, Right - INVASIVE DUCTAL CARCINOMA WITH CALCIFICATIONS, GRADE II/III, SPANNING 0.6 CM - DUCTAL CARCINOMA IN SITU WITH CALCIFICATIONS, HIGH GRADE. - THE SURGICAL RESECTION MARGINS ARE NEGATIVE FOR CARCINOMA. - SEE ONCOLOGY TABLE BELOW. 2. Lymph node, sentinel, biopsy, Right axillary #1 - THERE IS NO EVIDENCE OF CARCINOMA IN 1 OF 1 LYMPH NODE (0/1). 3. Lymph node, sentinel, biopsy, Right axillary #2 - THERE IS NO EVIDENCE OF CARCINOMA IN 1 OF 1 LYMPH NODE (0/1). 4. Lymph node, sentinel, biopsy, Right axillary #3 - THERE IS NO EVIDENCE OF CARCINOMA IN 1 OF 1 LYMPH NODE (0/1). 5. Lymph node, sentinel, biopsy, Right axillary #4 - THERE IS NO EVIDENCE OF CARCINOMA IN 1 OF 1 LYMPH NODE (0/1).  06/28/15 Diagnosis Breast, right, needle core biopsy, density and calcs 8:00 o'clock, 8 CMFN - INVASIVE AND IN SITU DUCTAL CARCINOMA WITH CALCIFICATIONS  Receptor Status: ER(95%), PR (90%), Her2-neu (negative), Ki-(15%)  Did patient present with symptoms (if so, please note symptoms) or was this found on screening mammography?: screening mammogram  Past/Anticipated interventions by surgeon, if any: 07/16/15 - Procedure: RADIOACTIVE SEED GUIDED PARTIAL MASTECTOMY WITH AXILLARY SENTINEL LYMPH NODE BIOPSY;  Surgeon: [name], MD;  Location: [facility];  Service: General;  Laterality: Right;  Past/Anticipated interventions by medical oncology, if any: Per Dr. Lindi Adie the plan is for "Oncotype DX testing. Adjuvant radiation therapy followed by 3. Adjuvant antiestrogen therapy."  Lymphedema issues, if any:  no  Pain issues, if any: no  OB Gyn history: She menarched at early age of 11 and went to menopause at age 2. She had 1 pregnancy, her first child was born at age 23. She has received birth control pills for approximately  20 years. She was never exposed to fertility medications or hormone replacement therapy. She has no family history of Breast/GYN/GI cancer.  SAFETY ISSUES:  Prior radiation? no  Pacemaker/ICD? no  Possible current pregnancy?no  Is the patient on methotrexate? no  Current Complaints / other details:  Patient is here with her husband.  BP 139/76 mmHg  Pulse 54  Temp(Src) 97.9 F (36.6 C) (Oral)  Ht '5\' 8"'  (1.727 m)  Wt 273 lb 6.4 oz (124.013 kg)  BMI 41.58 kg/m2    Jacqulyn Liner, RN 08/13/2015,8:21 AM

## 2015-08-18 ENCOUNTER — Encounter: Payer: Self-pay | Admitting: Radiation Oncology

## 2015-08-18 ENCOUNTER — Ambulatory Visit
Admission: RE | Admit: 2015-08-18 | Discharge: 2015-08-18 | Disposition: A | Discharge status: Home / Self Care | Payer: Medicare Other | Source: Ambulatory Visit | Attending: Radiation Oncology | Admitting: Radiation Oncology

## 2015-08-18 VITALS — BP 139/76 | HR 54 | Temp 97.9°F | Ht 68.0 in | Wt 273.4 lb

## 2015-08-18 DIAGNOSIS — Z510 Encounter for antineoplastic radiation therapy: Secondary | ICD-10-CM | POA: Diagnosis not present

## 2015-08-18 DIAGNOSIS — C50511 Malignant neoplasm of lower-outer quadrant of right female breast: Secondary | ICD-10-CM | POA: Diagnosis present

## 2015-08-18 DIAGNOSIS — Z170 Estrogen receptor positive status [ER+]: Secondary | ICD-10-CM | POA: Diagnosis not present

## 2015-08-18 NOTE — Progress Notes (Addendum)
Radiation Oncology         (336) [phone] ________________________________  Name: Brandi Rodgers MRN: [id number]  Date: 08/18/2015  DOB: 22-Mar-1951  Reevaluation Note  CC: [name], MD  [name], MD  Diagnosis: T1bN0 Stage 1A Invasive Ductal Carcinoma of the Right Breast (ER/PR positive, HER2 negative)  Narrative:  The patient returns today for routine follow-up.  The patient was originally seen in multidisciplinary breast clinic on 07/07/15. She was felt to be a good candidate for breast conservation treatment. She has completed surgery consisting of a right lumpectomy and sentinel lymph node biopsies on 07/16/15. Final pathology revealed a grade II, invasive ductal carcinoma with calcifications spanning 0.6 cm (pT1b, pN0) (ER/PR positive, HER2 negative, Ki67 15%) with negative margins. Also present was high grade DCIS with calcifications. 4/4 right axillary sentinel lymph nodes that were biopsied were negative for malignancy. Dr. Lindi Adie ordered Oncotype DX testing and the result came back as 19; intermediate risk. Dr. Lindi Adie did not recommend chemotherapy. The patient has done satisfactorily postoperatively. She is appropriate to proceed with adjuvant radiation treatment at this time.  The patient is currently taking Doxycycline because her surgeon believed she has developed cellulitis.  ROS: She feels some numbness under the right axillary region, but this is improving.  ALLERGIES:  is allergic to adhesive and methocarbamol.  Meds: Current Outpatient Prescriptions  Medication Sig Dispense Refill  . atenolol (TENORMIN) 50 MG tablet Take 50 mg by mouth daily.     [name] BIOTIN 5000 PO Take 1 tablet by mouth daily.    . Calcium-Vitamin D-Vitamin K (VIACTIV PO) Take 1 tablet by mouth daily.    [name] doxycycline (VIBRAMYCIN) 100 MG capsule Take 100 mg by mouth 2 (two) times daily.    [name] ELIQUIS 5 MG TABS tablet TAKE 1 TABLET (5 MG TOTAL) BY MOUTH 2 (TWO) TIMES DAILY. 180 tablet 1  .  fluticasone (CUTIVATE) 9.53 % cream 1 APPLICATION APPLY ON THE SKIN TWICE A DAY  0  . furosemide (LASIX) 20 MG tablet Take 20 mg by mouth daily as needed for fluid.     [name] gabapentin (NEURONTIN) 600 MG tablet Take 600 mg by mouth 3 (three) times daily.    . Multiple Vitamin (MULTIVITAMIN) tablet Take 1 tablet by mouth daily.    . rizatriptan (MAXALT-MLT) 10 MG disintegrating tablet Take 10 mg by mouth as needed for migraine. May repeat in 2 hours if needed    . ranitidine (ZANTAC) 75 MG tablet Take 75 mg by mouth 2 (two) times daily as needed for heartburn. Reported on 08/18/2015     No current facility-administered medications for this encounter.    Physical Findings: The patient is in no acute distress. Patient is alert and oriented.  height is '5\' 8"'  (1.727 m) and weight is 273 lb 6.4 oz (124.013 kg). Her oral temperature is 97.9 F (36.6 C). Her blood pressure is 139/76 and her pulse is 54. .   Lungs are clear to auscultation bilaterally. Heart has regular rate and rhythm. No palpable cervical, supraclavicular, or axillary adenopathy.  The patient is s/p right lumpectomy. The surgical incision is located in the lower outer quadrant. It is healing well with no signs of drainage or infection. There is a right axillary scar healing healing well with no signs of drainage or infection. The left breast has no palpable masses.  Lab Findings: Lab Results  Component Value Date   WBC 8.7 07/07/2015   HGB 12.8 07/07/2015   HCT 38.8  07/07/2015   MCV 79.4* 07/07/2015   PLT 253 07/07/2015     Radiographic Findings: No results found.  Impression: T1bN0,  Stage 1A Invasive Ductal Carcinoma of the Right Breast (ER/PR positive, HER2 negative).  The patient is status post lumpectomy as part of her breast conservation treatment strategy. The patient is appropriate to proceed with adjuvant radiation treatment at this time.  I discussed with the patient the role of radiation treatment in this setting.  We discussed the expected benefit in terms of local/regional control area we also discussed the possible side effects and risks of treatment. All of her questions were answered.  We also discussed the logistics of treatment. The patient wishes to proceed with simulation.  Plan:  The patient will be scheduled for CT simulation next week, April 13 at [facility], such that we could begin treatment planning. We will begin radiation treatment to the right breast 6 weeks post-op. The patient signed a consent form and this was placed in her medical chart.  ------------------------------------------------  Blair Promise, PhD, MD  This document serves as a record of services personally performed by Gery Pray, MD. It was created on his behalf by Darcus Austin, a trained medical scribe. The creation of this record is based on the scribe's personal observations and the provider's statements to them. This document has been checked and approved by the attending provider.

## 2015-08-18 NOTE — Progress Notes (Signed)
Please see the Nurse Progress Note in the MD Initial Consult Encounter for this patient. 

## 2015-08-19 ENCOUNTER — Telehealth: Payer: Self-pay | Admitting: Oncology

## 2015-08-19 NOTE — Telephone Encounter (Signed)
Left a message advising Brandi Rodgers that it is OK to have dental work while she is having radiation.

## 2015-08-19 NOTE — Telephone Encounter (Signed)
Brandi Rodgers left a message and said she needs a tooth pulled and also needs an implant.  She has an appointment for a consult next week and needs an OK from Radiation before she can proceed.  She is requesting a return call at [phone].

## 2015-08-26 ENCOUNTER — Ambulatory Visit
Admission: RE | Admit: 2015-08-26 | Discharge: 2015-08-26 | Disposition: A | Discharge status: Home / Self Care | Payer: Medicare Other | Source: Ambulatory Visit | Attending: Radiation Oncology | Admitting: Radiation Oncology

## 2015-08-26 ENCOUNTER — Other Ambulatory Visit: Payer: Self-pay | Admitting: *Deleted

## 2015-08-26 DIAGNOSIS — C50511 Malignant neoplasm of lower-outer quadrant of right female breast: Secondary | ICD-10-CM

## 2015-08-26 DIAGNOSIS — Z510 Encounter for antineoplastic radiation therapy: Secondary | ICD-10-CM | POA: Diagnosis not present

## 2015-08-26 NOTE — Progress Notes (Signed)
  Radiation Oncology         (336) [phone] ________________________________  Name: Brandi Rodgers MRN: [id number]  Date: 08/26/2015  DOB: 24-Nov-1950  SIMULATION AND TREATMENT PLANNING NOTE    ICD-9-CM ICD-10-CM   1. Breast cancer of lower-outer quadrant of right female breast ([place]) 174.5 C50.511     DIAGNOSIS:  T1bN0 Stage 1A Invasive Ductal Carcinoma of the Right Breast (ER/PR positive, HER2 negative)  NARRATIVE:  The patient was brought to the [facility].  Identity was confirmed.  All relevant records and images related to the planned course of therapy were reviewed.  The patient freely provided informed written consent to proceed with treatment after reviewing the details related to the planned course of therapy. The consent form was witnessed and verified by the simulation staff.  Then, the patient was set-up in a stable reproducible  supine position for radiation therapy.  CT images were obtained.  Surface markings were placed.  The CT images were loaded into the planning software.  Then the target and avoidance structures were contoured.  Treatment planning then occurred.  The radiation prescription was entered and confirmed.  Then, I designed and supervised the construction of a total of 3 medically necessary complex treatment devices.  I have requested : 3D Simulation  I have requested a DVH of the following structures: lumpectomy cavity, heart, lungs.  I have ordered:dose calc  PLAN:  The patient will receive 50.4 Gy in 28 fractions Followed by a boost to the lumpectomy cavity of 10 gray and a cumulative dose of 60.4 gray.  ________________________________ -----------------------------------  Blair Promise, PhD, MD  This document serves as a record of services personally performed by Gery Pray, MD. It was created on his behalf by Darcus Austin, a trained medical scribe. The creation of this record is based on the scribe's personal observations and the provider's  statements to them. This document has been checked and approved by the attending provider.

## 2015-08-27 ENCOUNTER — Telehealth: Payer: Self-pay | Admitting: Hematology and Oncology

## 2015-08-27 NOTE — Telephone Encounter (Signed)
s.w. pt and advised on JUNE appt....pt ok and aware of d.t

## 2015-09-01 DIAGNOSIS — Z510 Encounter for antineoplastic radiation therapy: Secondary | ICD-10-CM | POA: Diagnosis not present

## 2015-09-02 ENCOUNTER — Ambulatory Visit
Admission: RE | Admit: 2015-09-02 | Discharge: 2015-09-02 | Disposition: A | Discharge status: Home / Self Care | Payer: Medicare Other | Source: Ambulatory Visit | Attending: Radiation Oncology | Admitting: Radiation Oncology

## 2015-09-02 DIAGNOSIS — Z510 Encounter for antineoplastic radiation therapy: Secondary | ICD-10-CM | POA: Diagnosis not present

## 2015-09-02 DIAGNOSIS — C50511 Malignant neoplasm of lower-outer quadrant of right female breast: Secondary | ICD-10-CM

## 2015-09-02 NOTE — Progress Notes (Signed)
  Radiation Oncology         (336) [phone] ________________________________  Name: Brandi Rodgers MRN: [id number]  Date: 09/02/2015  DOB: 10/08/1950  Simulation Verification Note    ICD-9-CM ICD-10-CM   1. Breast cancer of lower-outer quadrant of right female breast ([place]) 174.5 C50.511     Status: outpatient  NARRATIVE: The patient was brought to the treatment unit and placed in the planned treatment position. The clinical setup was verified. Then port films were obtained and uploaded to the radiation oncology medical record software.  The treatment beams were carefully compared against the planned radiation fields. The position location and shape of the radiation fields was reviewed. They targeted volume of tissue appears to be appropriately covered by the radiation beams. Organs at risk appear to be excluded as planned.  Based on my personal review, I approved the simulation verification. The patient's treatment will proceed as planned.  -----------------------------------  Blair Promise, PhD, MD

## 2015-09-06 ENCOUNTER — Ambulatory Visit: Payer: Medicare Other

## 2015-09-06 ENCOUNTER — Ambulatory Visit
Admission: RE | Admit: 2015-09-06 | Discharge: 2015-09-06 | Disposition: A | Discharge status: Home / Self Care | Payer: Medicare Other | Source: Ambulatory Visit | Attending: Radiation Oncology | Admitting: Radiation Oncology

## 2015-09-07 ENCOUNTER — Encounter: Payer: Self-pay | Admitting: Radiation Oncology

## 2015-09-07 ENCOUNTER — Ambulatory Visit: Payer: Medicare Other

## 2015-09-08 ENCOUNTER — Ambulatory Visit: Payer: Medicare Other

## 2015-09-08 ENCOUNTER — Ambulatory Visit
Admission: RE | Admit: 2015-09-08 | Discharge: 2015-09-08 | Disposition: A | Discharge status: Home / Self Care | Payer: Medicare Other | Source: Ambulatory Visit | Attending: Radiation Oncology | Admitting: Radiation Oncology

## 2015-09-08 DIAGNOSIS — Z510 Encounter for antineoplastic radiation therapy: Secondary | ICD-10-CM | POA: Diagnosis not present

## 2015-09-08 NOTE — Addendum Note (Signed)
Encounter addended by: Jacqulyn Liner, RN on: 09/08/2015 11:25 AM<BR>     Documentation filed: Charges VN

## 2015-09-09 ENCOUNTER — Ambulatory Visit
Admission: RE | Admit: 2015-09-09 | Discharge: 2015-09-09 | Disposition: A | Discharge status: Home / Self Care | Payer: Medicare Other | Source: Ambulatory Visit | Attending: Radiation Oncology | Admitting: Radiation Oncology

## 2015-09-09 DIAGNOSIS — Z510 Encounter for antineoplastic radiation therapy: Secondary | ICD-10-CM | POA: Diagnosis not present

## 2015-09-10 ENCOUNTER — Other Ambulatory Visit: Payer: Self-pay | Admitting: Family Medicine

## 2015-09-10 ENCOUNTER — Ambulatory Visit
Admission: RE | Admit: 2015-09-10 | Discharge: 2015-09-10 | Disposition: A | Discharge status: Home / Self Care | Payer: Medicare Other | Source: Ambulatory Visit | Attending: Radiation Oncology | Admitting: Radiation Oncology

## 2015-09-10 DIAGNOSIS — Z136 Encounter for screening for cardiovascular disorders: Secondary | ICD-10-CM

## 2015-09-10 DIAGNOSIS — Z510 Encounter for antineoplastic radiation therapy: Secondary | ICD-10-CM | POA: Diagnosis not present

## 2015-09-12 ENCOUNTER — Ambulatory Visit: Payer: Medicare Other

## 2015-09-13 ENCOUNTER — Ambulatory Visit
Admission: RE | Admit: 2015-09-13 | Discharge: 2015-09-13 | Disposition: A | Discharge status: Home / Self Care | Payer: Medicare Other | Source: Ambulatory Visit | Attending: Radiation Oncology | Admitting: Radiation Oncology

## 2015-09-13 ENCOUNTER — Telehealth: Payer: Self-pay | Admitting: *Deleted

## 2015-09-13 DIAGNOSIS — C50511 Malignant neoplasm of lower-outer quadrant of right female breast: Secondary | ICD-10-CM

## 2015-09-13 DIAGNOSIS — Z510 Encounter for antineoplastic radiation therapy: Secondary | ICD-10-CM | POA: Diagnosis not present

## 2015-09-13 MED ORDER — RADIAPLEXRX EX GEL
Freq: Once | CUTANEOUS | Status: AC
  Administered 2015-09-13: 10:00:00 via TOPICAL

## 2015-09-13 MED ORDER — ALRA NON-METALLIC DEODORANT (RAD-ONC)
1.0000 "application " | Freq: Once | TOPICAL | Status: AC
  Administered 2015-09-13: 1 via TOPICAL

## 2015-09-13 NOTE — Telephone Encounter (Signed)
Left vm for pt to return call to assess needs during xrt. Contact information provided. 

## 2015-09-13 NOTE — Progress Notes (Signed)
Pt here for patient teaching.  Pt given Radiation and You booklet, skin care instructions, Alra deodorant and Radiaplex gel. Pt reports they have not watched the Radiation Therapy Education video and has been given the link to watch at home.  Reviewed areas of pertinence such as fatigue and skin changes . Pt able to give teach back of to pat skin and use unscented/gentle soap,apply Radiaplex bid and avoid applying anything to skin within 4 hours of treatment. Pt demonstrated understanding and verbalizes understanding of information given and will contact nursing with any questions or concerns.     Http://rtanswers.org/treatmentinformation/whattoexpect/index

## 2015-09-14 ENCOUNTER — Ambulatory Visit
Admission: RE | Admit: 2015-09-14 | Discharge: 2015-09-14 | Disposition: A | Discharge status: Home / Self Care | Payer: Medicare Other | Source: Ambulatory Visit | Attending: Radiation Oncology | Admitting: Radiation Oncology

## 2015-09-14 ENCOUNTER — Encounter: Payer: Self-pay | Admitting: Radiation Oncology

## 2015-09-14 VITALS — BP 133/68 | HR 50 | Temp 98.2°F | Ht 68.0 in | Wt 278.0 lb

## 2015-09-14 DIAGNOSIS — C50511 Malignant neoplasm of lower-outer quadrant of right female breast: Secondary | ICD-10-CM

## 2015-09-14 DIAGNOSIS — Z510 Encounter for antineoplastic radiation therapy: Secondary | ICD-10-CM | POA: Diagnosis not present

## 2015-09-14 NOTE — Progress Notes (Signed)
Brandi Rodgers has completed 5 fractions to her right breast.  She denies having pain or fatigue.  The skin on her right breast is pink around her nipple area.  She is going to start using radiaplex today.  BP 133/68 mmHg  Pulse 50  Temp(Src) 98.2 F (36.8 C) (Oral)  Ht 5\' 8"  (1.727 m)  Wt 278 lb (126.1 kg)  BMI 42.28 kg/m2

## 2015-09-14 NOTE — Progress Notes (Signed)
  Radiation Oncology         (336) [phone] ________________________________  Name: Brandi Rodgers MRN: [id number]  Date: 09/14/2015  DOB: 1950/09/16  Weekly Radiation Therapy Management  Current Dose: 9 Gy     Planned Dose:  60.4 Gy  Narrative . . . . . . . . The patient presents for routine under treatment assessment.                          Brandi Rodgers has completed 5 fractions to her right breast. She denies having pain or fatigue. The nurse noted the skin on her right breast is pink around her nipple area. She is going to start using radiaplex today.                                 Set-up films were reviewed.                                 The chart was checked. Physical Findings. . .  height is 5\' 8"  (1.727 m) and weight is 278 lb (126.1 kg). Her oral temperature is 98.2 F (36.8 C). Her blood pressure is 133/68 and her pulse is 50. . Weight essentially stable. Lungs are clear to auscultation bilaterally. Heart has regular rate and rhythm. The right breast does not show significant radiation reaction at this time. Impression . . . . . . . The patient is tolerating radiation. Plan . . . . . . . . . . . . Continue treatment as planned.  ________________________________   Blair Promise, PhD, MD   This document serves as a record of services personally performed by Gery Pray, MD. It was created on his behalf by Darcus Austin, a trained medical scribe. The creation of this record is based on the scribe's personal observations and the provider's statements to them. This document has been checked and approved by the attending provider.

## 2015-09-15 ENCOUNTER — Ambulatory Visit
Admission: RE | Admit: 2015-09-15 | Discharge: 2015-09-15 | Disposition: A | Discharge status: Home / Self Care | Payer: Medicare Other | Source: Ambulatory Visit | Attending: Radiation Oncology | Admitting: Radiation Oncology

## 2015-09-15 DIAGNOSIS — Z510 Encounter for antineoplastic radiation therapy: Secondary | ICD-10-CM | POA: Diagnosis not present

## 2015-09-16 ENCOUNTER — Ambulatory Visit
Admission: RE | Admit: 2015-09-16 | Discharge: 2015-09-16 | Disposition: A | Discharge status: Home / Self Care | Payer: Medicare Other | Source: Ambulatory Visit | Attending: Radiation Oncology | Admitting: Radiation Oncology

## 2015-09-16 DIAGNOSIS — Z510 Encounter for antineoplastic radiation therapy: Secondary | ICD-10-CM | POA: Diagnosis not present

## 2015-09-17 ENCOUNTER — Ambulatory Visit: Payer: Medicare Other

## 2015-09-20 ENCOUNTER — Ambulatory Visit
Admission: RE | Admit: 2015-09-20 | Discharge: 2015-09-20 | Disposition: A | Discharge status: Home / Self Care | Payer: Medicare Other | Source: Ambulatory Visit | Attending: Radiation Oncology | Admitting: Radiation Oncology

## 2015-09-20 ENCOUNTER — Ambulatory Visit: Payer: Medicare Other

## 2015-09-20 DIAGNOSIS — Z510 Encounter for antineoplastic radiation therapy: Secondary | ICD-10-CM | POA: Diagnosis not present

## 2015-09-20 NOTE — Telephone Encounter (Signed)
Encounter opened in error

## 2015-09-21 ENCOUNTER — Encounter: Payer: Self-pay | Admitting: Radiation Oncology

## 2015-09-21 ENCOUNTER — Ambulatory Visit
Admission: RE | Admit: 2015-09-21 | Discharge: 2015-09-21 | Disposition: A | Discharge status: Home / Self Care | Payer: Medicare Other | Source: Ambulatory Visit | Attending: Radiation Oncology | Admitting: Radiation Oncology

## 2015-09-21 VITALS — BP 130/67 | HR 55 | Temp 98.6°F | Ht 68.0 in | Wt 282.3 lb

## 2015-09-21 DIAGNOSIS — Z510 Encounter for antineoplastic radiation therapy: Secondary | ICD-10-CM | POA: Diagnosis not present

## 2015-09-21 DIAGNOSIS — C50511 Malignant neoplasm of lower-outer quadrant of right female breast: Secondary | ICD-10-CM

## 2015-09-21 NOTE — Progress Notes (Addendum)
Brandi Rodgers is here for her 9th fraction of radiation to her Right Breast. She admits to a little fatigue at times. She asked if she should have a steroid shot to her lower back for chronic back pain on 10/06/15? She also complains of an upset second from recent antibiotics for a dental procedure, and redness to her breast (prescribed by Dr. Dalbert Batman) and has been taking pepto bismol for this. She does have some redness to her Right Breast, and she is using the radiaplex cream when she remembers.  BP 130/67 mmHg  Pulse 55  Temp(Src) 98.6 F (37 C)  Ht 5\' 8"  (1.727 m)  Wt 282 lb 4.8 oz (128.05 kg)  BMI 42.93 kg/m2  SpO2 99%

## 2015-09-21 NOTE — Progress Notes (Signed)
Weekly Management Note Current Dose:  16.2 Gy  Projected Dose:  50.4 Gy   Narrative:  The patient presents for routine under treatment assessment.  CBCT/MVCT images/Port film x-rays were reviewed.  The chart was checked. Ms. Inzer is here for her 9th fraction of radiation to her Right Breast. She admits to a little fatigue at times. She asked if she should have a steroid shot to her lower back for chronic back pain on 10/06/15? She also complains of an upset stomach from recent antibiotics for a dental procedure, and redness to her breast (prescribed by Dr. Dalbert Batman) and has been taking pepto bismol for this. She does have some redness to her Right Breast, and she is using the radiaplex cream when she remembers.  Physical Findings: Weight: 282 lb 4.8 oz (128.05 kg). Unchanged. She has some pink around her nipple.  Impression:  The patient is tolerating radiation.  Plan:  Continue treatment as planned. OK to get steroid injection. Continue antibiotics.     ------------------------------------------------  Thea Silversmith, MD    This document serves as a record of services personally performed by Thea Silversmith, MD. It was created on her behalf by  Lendon Collar, a trained medical scribe. The creation of this record is based on the scribe's personal observations and the provider's statements to them. This document has been checked and approved by the attending provider.

## 2015-09-22 ENCOUNTER — Ambulatory Visit
Admission: RE | Admit: 2015-09-22 | Discharge: 2015-09-22 | Disposition: A | Discharge status: Home / Self Care | Payer: Medicare Other | Source: Ambulatory Visit | Attending: Radiation Oncology | Admitting: Radiation Oncology

## 2015-09-22 DIAGNOSIS — Z510 Encounter for antineoplastic radiation therapy: Secondary | ICD-10-CM | POA: Diagnosis not present

## 2015-09-23 ENCOUNTER — Ambulatory Visit
Admission: RE | Admit: 2015-09-23 | Discharge: 2015-09-23 | Disposition: A | Discharge status: Home / Self Care | Payer: Medicare Other | Source: Ambulatory Visit | Attending: Radiation Oncology | Admitting: Radiation Oncology

## 2015-09-23 DIAGNOSIS — Z510 Encounter for antineoplastic radiation therapy: Secondary | ICD-10-CM | POA: Diagnosis not present

## 2015-09-24 ENCOUNTER — Ambulatory Visit
Admission: RE | Admit: 2015-09-24 | Discharge: 2015-09-24 | Disposition: A | Discharge status: Home / Self Care | Payer: Medicare Other | Source: Ambulatory Visit | Attending: Radiation Oncology | Admitting: Radiation Oncology

## 2015-09-24 DIAGNOSIS — Z510 Encounter for antineoplastic radiation therapy: Secondary | ICD-10-CM | POA: Diagnosis not present

## 2015-09-26 ENCOUNTER — Ambulatory Visit: Payer: Medicare Other

## 2015-09-27 ENCOUNTER — Ambulatory Visit
Admission: RE | Admit: 2015-09-27 | Discharge: 2015-09-27 | Disposition: A | Discharge status: Home / Self Care | Payer: Medicare Other | Source: Ambulatory Visit | Attending: Radiation Oncology | Admitting: Radiation Oncology

## 2015-09-27 DIAGNOSIS — Z510 Encounter for antineoplastic radiation therapy: Secondary | ICD-10-CM | POA: Diagnosis not present

## 2015-09-28 ENCOUNTER — Encounter: Payer: Self-pay | Admitting: Radiation Oncology

## 2015-09-28 ENCOUNTER — Ambulatory Visit
Admission: RE | Admit: 2015-09-28 | Discharge: 2015-09-28 | Disposition: A | Discharge status: Home / Self Care | Payer: Medicare Other | Source: Ambulatory Visit | Attending: Radiation Oncology | Admitting: Radiation Oncology

## 2015-09-28 VITALS — BP 131/74 | HR 50 | Temp 98.1°F | Resp 16 | Ht 68.0 in | Wt 279.5 lb

## 2015-09-28 DIAGNOSIS — C50511 Malignant neoplasm of lower-outer quadrant of right female breast: Secondary | ICD-10-CM

## 2015-09-28 DIAGNOSIS — Z510 Encounter for antineoplastic radiation therapy: Secondary | ICD-10-CM | POA: Diagnosis not present

## 2015-09-28 NOTE — Progress Notes (Signed)
Brandi Rodgers has completed 14 fractions to her right breast.  She denies having pain except for occasional sharp pains in her right breast.  She denies having fatigue.  She is using radiaplex.  The skin on her right breast is pink.    BP 131/74 mmHg  Pulse 50  Temp(Src) 98.1 F (36.7 C) (Oral)  Resp 16  Ht 5\' 8"  (1.727 m)  Wt 279 lb 8 oz (126.78 kg)  BMI 42.51 kg/m2   Wt Readings from Last 3 Encounters:  09/28/15 279 lb 8 oz (126.78 kg)  09/21/15 282 lb 4.8 oz (128.05 kg)  09/14/15 278 lb (126.1 kg)

## 2015-09-28 NOTE — Progress Notes (Signed)
  Radiation Oncology         (336) [phone] ________________________________  Name: WAKITA LABERGE MRN: [id number]  Date: 09/28/2015  DOB: Mar 22, 1951  Weekly Radiation Therapy Management    ICD-9-CM ICD-10-CM   1. Breast cancer of lower-outer quadrant of right female breast ([facility]) 174.5 C50.511     Current Dose: 25.2 Gy     Planned Dose:  60.4 Gy  Narrative . . . . . . . . The patient presents for routine under treatment assessment.                                Charliene has completed 14 fractions to her right breast. She denies having pain except for occasional sharp pains in her right breast. She denies having fatigue. She is using radiaplex. The skin on her right breast is pink.                                     Set-up films were reviewed.                                 The chart was checked. Physical Findings. . .  height is 5\' 8"  (1.727 m) and weight is 279 lb 8 oz (126.78 kg). Her oral temperature is 98.1 F (36.7 C). Her blood pressure is 131/74 and her pulse is 50. Her respiration is 16. .The lungs are clear. The heart has a regular rhythm and rate. The right breast area shows some mild erythema.   Impression . . . . . . . The patient is tolerating radiation. Plan . . . . . . . . . . . . Continue treatment as planned.  ________________________________   Blair Promise, PhD, MD

## 2015-09-29 ENCOUNTER — Ambulatory Visit
Admission: RE | Admit: 2015-09-29 | Discharge: 2015-09-29 | Disposition: A | Discharge status: Home / Self Care | Payer: Medicare Other | Source: Ambulatory Visit | Attending: Radiation Oncology | Admitting: Radiation Oncology

## 2015-09-29 DIAGNOSIS — Z510 Encounter for antineoplastic radiation therapy: Secondary | ICD-10-CM | POA: Diagnosis not present

## 2015-09-30 ENCOUNTER — Ambulatory Visit
Admission: RE | Admit: 2015-09-30 | Discharge: 2015-09-30 | Disposition: A | Discharge status: Home / Self Care | Payer: Medicare Other | Source: Ambulatory Visit | Attending: Radiation Oncology | Admitting: Radiation Oncology

## 2015-09-30 DIAGNOSIS — Z510 Encounter for antineoplastic radiation therapy: Secondary | ICD-10-CM | POA: Diagnosis not present

## 2015-10-01 ENCOUNTER — Ambulatory Visit
Admission: RE | Admit: 2015-10-01 | Discharge: 2015-10-01 | Disposition: A | Discharge status: Home / Self Care | Payer: Medicare Other | Source: Ambulatory Visit | Attending: Radiation Oncology | Admitting: Radiation Oncology

## 2015-10-01 DIAGNOSIS — Z510 Encounter for antineoplastic radiation therapy: Secondary | ICD-10-CM | POA: Diagnosis not present

## 2015-10-04 ENCOUNTER — Ambulatory Visit
Admission: RE | Admit: 2015-10-04 | Discharge: 2015-10-04 | Disposition: A | Discharge status: Home / Self Care | Payer: Medicare Other | Source: Ambulatory Visit | Attending: Radiation Oncology | Admitting: Radiation Oncology

## 2015-10-04 DIAGNOSIS — Z510 Encounter for antineoplastic radiation therapy: Secondary | ICD-10-CM | POA: Diagnosis not present

## 2015-10-05 ENCOUNTER — Ambulatory Visit
Admission: RE | Admit: 2015-10-05 | Discharge: 2015-10-05 | Disposition: A | Discharge status: Home / Self Care | Payer: Medicare Other | Source: Ambulatory Visit | Attending: Radiation Oncology | Admitting: Radiation Oncology

## 2015-10-05 ENCOUNTER — Encounter: Payer: Self-pay | Admitting: Radiation Oncology

## 2015-10-05 VITALS — BP 135/73 | HR 53 | Temp 98.4°F | Resp 16 | Ht 68.0 in | Wt 282.1 lb

## 2015-10-05 DIAGNOSIS — C50511 Malignant neoplasm of lower-outer quadrant of right female breast: Secondary | ICD-10-CM

## 2015-10-05 DIAGNOSIS — Z510 Encounter for antineoplastic radiation therapy: Secondary | ICD-10-CM | POA: Diagnosis not present

## 2015-10-05 NOTE — Progress Notes (Signed)
Brandi Rodgers has completed 19 fractions to her right breast.  She reports having pain in her joints, especially her right elbow, yesterday.  She has taken tramadol which helped.  She reports having slight fatigue.  She will have a steroid shot in her lower back on Wednesday.  She is using radiaplex and hydrocortisone for itching.  The skin on her right breast is red with a rash in the upper portion of her breast and underneath.  BP 135/73 mmHg  Pulse 53  Temp(Src) 98.4 F (36.9 C) (Oral)  Resp 16  Ht 5\' 8"  (1.727 m)  Wt 282 lb 1.6 oz (127.96 kg)  BMI 42.90 kg/m2   Wt Readings from Last 3 Encounters:  10/05/15 282 lb 1.6 oz (127.96 kg)  09/28/15 279 lb 8 oz (126.78 kg)  09/21/15 282 lb 4.8 oz (128.05 kg)

## 2015-10-05 NOTE — Progress Notes (Signed)
  Radiation Oncology         [phone]) [phone] ________________________________  Name: Brandi Rodgers MRN: [id number]  Date: 10/05/2015  DOB: Jan 30, 1951  Weekly Radiation Therapy Management    ICD-9-CM ICD-10-CM   1. Breast cancer of lower-outer quadrant of right female breast ([facility]) 174.5 C50.511      Current Dose: 34.2 Gy     Planned Dose:  60.4 Gy  Narrative . . . . . . . . The patient presents for routine under treatment assessment.                                 Brandi Rodgers has completed 19 fractions to her right breast. She reports having pain in her joints, especially her right elbow, yesterday. She has taken tramadol which helped. She reports having slight fatigue. She will have a steroid shot in her lower back on Wednesday. She is using radiaplex and hydrocortisone for itching.                                   Set-up films were reviewed.                                 The chart was checked. Physical Findings. . .  height is 5\' 8"  (1.727 m) and weight is 282 lb 1.6 oz (127.96 kg). Her oral temperature is 98.4 F (36.9 C). Her blood pressure is 135/73 and her pulse is 53. Her respiration is 16. . Weight essentially stable.  No significant changes.  The lungs are clear. The heart has a regular rhythm and rate.  The skin on her right breast is red with a rash in the upper portion of her breast and underneath. Impression . . . . . . . The patient is tolerating radiation. Plan . . . . . . . . . . . . Continue treatment as planned.  ________________________________   Blair Promise, PhD, MD

## 2015-10-06 ENCOUNTER — Ambulatory Visit
Admission: RE | Admit: 2015-10-06 | Discharge: 2015-10-06 | Disposition: A | Discharge status: Home / Self Care | Payer: Medicare Other | Source: Ambulatory Visit | Attending: Radiation Oncology | Admitting: Radiation Oncology

## 2015-10-06 DIAGNOSIS — Z510 Encounter for antineoplastic radiation therapy: Secondary | ICD-10-CM | POA: Diagnosis not present

## 2015-10-07 ENCOUNTER — Ambulatory Visit
Admission: RE | Admit: 2015-10-07 | Discharge: 2015-10-07 | Disposition: A | Discharge status: Home / Self Care | Payer: Medicare Other | Source: Ambulatory Visit | Attending: Radiation Oncology | Admitting: Radiation Oncology

## 2015-10-07 DIAGNOSIS — Z510 Encounter for antineoplastic radiation therapy: Secondary | ICD-10-CM | POA: Diagnosis not present

## 2015-10-08 ENCOUNTER — Ambulatory Visit
Admission: RE | Admit: 2015-10-08 | Discharge: 2015-10-08 | Disposition: A | Discharge status: Home / Self Care | Payer: Medicare Other | Source: Ambulatory Visit | Attending: Radiation Oncology | Admitting: Radiation Oncology

## 2015-10-08 DIAGNOSIS — Z510 Encounter for antineoplastic radiation therapy: Secondary | ICD-10-CM | POA: Diagnosis not present

## 2015-10-12 ENCOUNTER — Ambulatory Visit
Admission: RE | Admit: 2015-10-12 | Discharge: 2015-10-12 | Disposition: A | Discharge status: Home / Self Care | Payer: Medicare Other | Source: Ambulatory Visit | Attending: Radiation Oncology | Admitting: Radiation Oncology

## 2015-10-12 DIAGNOSIS — Z510 Encounter for antineoplastic radiation therapy: Secondary | ICD-10-CM | POA: Diagnosis not present

## 2015-10-13 ENCOUNTER — Ambulatory Visit
Admission: RE | Admit: 2015-10-13 | Discharge: 2015-10-13 | Disposition: A | Discharge status: Home / Self Care | Payer: Medicare Other | Source: Ambulatory Visit | Attending: Radiation Oncology | Admitting: Radiation Oncology

## 2015-10-13 ENCOUNTER — Ambulatory Visit: Payer: Medicare Other | Admitting: Radiation Oncology

## 2015-10-13 ENCOUNTER — Encounter: Payer: Self-pay | Admitting: Radiation Oncology

## 2015-10-13 VITALS — BP 125/64 | HR 57 | Temp 98.1°F | Ht 68.0 in | Wt 276.2 lb

## 2015-10-13 DIAGNOSIS — Z510 Encounter for antineoplastic radiation therapy: Secondary | ICD-10-CM | POA: Diagnosis not present

## 2015-10-13 DIAGNOSIS — C50511 Malignant neoplasm of lower-outer quadrant of right female breast: Secondary | ICD-10-CM

## 2015-10-13 NOTE — Progress Notes (Signed)
  Radiation Oncology         [phone]) [phone] ________________________________  Name: Brandi Rodgers MRN: [id number]  Date: 10/13/2015  DOB: Sep 24, 1950  Weekly Radiation Therapy Management    ICD-9-CM ICD-10-CM   1. Breast cancer of lower-outer quadrant of right female breast ([facility]) 174.5 C50.511      Current Dose: 43.2 Gy     Planned Dose:  60.4 Gy  Narrative . . . . . . . . The patient presents for routine under treatment assessment.                                 Gola Ardito has completed 24 fractions to her right breast. She reports occasional sharp and uncomfortable pain in her right breast. She reports having occasional fatigue. She is using radiaplex and hydrocortone cream.                                 Set-up films were reviewed.                                 The chart was checked. Physical Findings. . .  height is 5\' 8"  (1.727 m) and weight is 276 lb 3.2 oz (125.283 kg). Her oral temperature is 98.1 F (36.7 C). Her blood pressure is 125/64 and her pulse is 57. . The lungs are clear. The heart has a regular rhythm and rate. Erythema in the right upper inner quadrant and the inframammary fold area. Impression . . . . . . . The patient is tolerating radiation. Plan . . . . . . . . . . . . Continue treatment as planned.  ________________________________   Blair Promise, PhD, MD  This document serves as a record of services personally performed by Gery Pray, MD. It was created on his behalf by Darcus Austin, a trained medical scribe. The creation of this record is based on the scribe's personal observations and the provider's statements to them. This document has been checked and approved by the attending provider.

## 2015-10-13 NOTE — Progress Notes (Signed)
Jaquel Leister has completed 24 fractions to her right breast.  She reports occasional sharp and uncomfortable pain in her right breast.  She reports having occasional fatigue.  She is using radiaplex and hydrocortone cream.  The skin on her right breast is red with dermatitis on the upper portion.  BP 125/64 mmHg  Pulse 57  Temp(Src) 98.1 F (36.7 C) (Oral)  Ht 5\' 8"  (1.727 m)  Wt 276 lb 3.2 oz (125.283 kg)  BMI 42.01 kg/m2   Wt Readings from Last 3 Encounters:  10/13/15 276 lb 3.2 oz (125.283 kg)  10/05/15 282 lb 1.6 oz (127.96 kg)  09/28/15 279 lb 8 oz (126.78 kg)

## 2015-10-14 ENCOUNTER — Ambulatory Visit
Admission: RE | Admit: 2015-10-14 | Discharge: 2015-10-14 | Disposition: A | Discharge status: Home / Self Care | Payer: Medicare Other | Source: Ambulatory Visit | Attending: Radiation Oncology | Admitting: Radiation Oncology

## 2015-10-14 DIAGNOSIS — Z510 Encounter for antineoplastic radiation therapy: Secondary | ICD-10-CM | POA: Diagnosis not present

## 2015-10-15 ENCOUNTER — Ambulatory Visit
Admission: RE | Admit: 2015-10-15 | Discharge: 2015-10-15 | Disposition: A | Discharge status: Home / Self Care | Payer: Medicare Other | Source: Ambulatory Visit | Attending: Radiation Oncology | Admitting: Radiation Oncology

## 2015-10-15 DIAGNOSIS — Z510 Encounter for antineoplastic radiation therapy: Secondary | ICD-10-CM | POA: Diagnosis not present

## 2015-10-18 ENCOUNTER — Ambulatory Visit
Admission: RE | Admit: 2015-10-18 | Discharge: 2015-10-18 | Disposition: A | Discharge status: Home / Self Care | Payer: Medicare Other | Source: Ambulatory Visit | Attending: Radiation Oncology | Admitting: Radiation Oncology

## 2015-10-18 ENCOUNTER — Ambulatory Visit: Payer: Medicare Other

## 2015-10-18 DIAGNOSIS — Z510 Encounter for antineoplastic radiation therapy: Secondary | ICD-10-CM | POA: Diagnosis not present

## 2015-10-19 ENCOUNTER — Ambulatory Visit
Admission: RE | Admit: 2015-10-19 | Discharge: 2015-10-19 | Disposition: A | Discharge status: Home / Self Care | Payer: Medicare Other | Source: Ambulatory Visit | Attending: Radiation Oncology | Admitting: Radiation Oncology

## 2015-10-19 ENCOUNTER — Ambulatory Visit: Payer: Medicare Other

## 2015-10-19 VITALS — BP 120/63 | HR 59 | Temp 97.8°F | Ht 68.0 in | Wt 280.0 lb

## 2015-10-19 DIAGNOSIS — Z510 Encounter for antineoplastic radiation therapy: Secondary | ICD-10-CM | POA: Diagnosis not present

## 2015-10-19 DIAGNOSIS — C50511 Malignant neoplasm of lower-outer quadrant of right female breast: Secondary | ICD-10-CM

## 2015-10-19 NOTE — Progress Notes (Signed)
  Radiation Oncology         [phone] ________________________________  Name: Brandi Rodgers MRN: [id number]  Date: 10/19/2015  DOB: November 17, 1950  Weekly Radiation Therapy Management  Breast cancer of the lower-outer quadrant of the right female breast.  Current Dose: 50.4 Gy     Planned Dose:  60.4 Gy  Narrative . . . . . . . . The patient presents for routine under treatment assessment.                                 Brandi Rodgers has completed 28 fractions to her right breast. She denies having pain and fatigue. She is using radiaplex gel. The skin on her right breast is red with a small area of peeling under her right breast.                                 Set-up films were reviewed.                                 The chart was checked. Physical Findings. . .  height is 5\' 8"  (1.727 m) and weight is 280 lb (127.007 kg). Her oral temperature is 97.8 F (36.6 C). Her blood pressure is 120/63 and her pulse is 59. . The lungs are clear. The heart has a regular rhythm and rate. Erythema in the right upper inner quadrant and the inframammary fold area. No skin breakdown Impression . . . . . . . The patient is tolerating radiation. Plan . . . . . . . . . . . . Continue treatment as planned.  ________________________________   Blair Promise, PhD, MD    This document serves as a record of services personally performed by Gery Pray, MD. It was created on his behalf by Lendon Collar, a trained medical scribe. The creation of this record is based on the scribe's personal observations and the provider's statements to them. This document has been checked and approved by the attending provider.

## 2015-10-19 NOTE — Progress Notes (Signed)
Brandi Rodgers has completed 28 fractions to her right breast.  She denies having pain and fatigue.  She is using radiaplex gel.  The skin on her right breast is red with a small area of peeling under her right breast.   BP 120/63 mmHg  Pulse 59  Temp(Src) 97.8 F (36.6 C) (Oral)  Ht 5\' 8"  (1.727 m)  Wt 280 lb (127.007 kg)  BMI 42.58 kg/m2   Wt Readings from Last 3 Encounters:  10/19/15 280 lb (127.007 kg)  10/13/15 276 lb 3.2 oz (125.283 kg)  10/05/15 282 lb 1.6 oz (127.96 kg)

## 2015-10-20 ENCOUNTER — Ambulatory Visit: Payer: Medicare Other

## 2015-10-20 DIAGNOSIS — Z510 Encounter for antineoplastic radiation therapy: Secondary | ICD-10-CM | POA: Diagnosis not present

## 2015-10-21 ENCOUNTER — Ambulatory Visit: Payer: Medicare Other

## 2015-10-21 ENCOUNTER — Ambulatory Visit
Admission: RE | Admit: 2015-10-21 | Discharge: 2015-10-21 | Disposition: A | Discharge status: Home / Self Care | Payer: Medicare Other | Source: Ambulatory Visit | Attending: Radiation Oncology | Admitting: Radiation Oncology

## 2015-10-21 DIAGNOSIS — Z510 Encounter for antineoplastic radiation therapy: Secondary | ICD-10-CM | POA: Diagnosis not present

## 2015-10-22 ENCOUNTER — Ambulatory Visit: Payer: Medicare Other

## 2015-10-22 ENCOUNTER — Ambulatory Visit
Admission: RE | Admit: 2015-10-22 | Discharge: 2015-10-22 | Disposition: A | Discharge status: Home / Self Care | Payer: Medicare Other | Source: Ambulatory Visit | Attending: Radiation Oncology | Admitting: Radiation Oncology

## 2015-10-22 DIAGNOSIS — Z510 Encounter for antineoplastic radiation therapy: Secondary | ICD-10-CM | POA: Diagnosis not present

## 2015-10-25 ENCOUNTER — Ambulatory Visit: Payer: Medicare Other

## 2015-10-25 ENCOUNTER — Ambulatory Visit
Admission: RE | Admit: 2015-10-25 | Discharge: 2015-10-25 | Disposition: A | Discharge status: Home / Self Care | Payer: Medicare Other | Source: Ambulatory Visit | Attending: Radiation Oncology | Admitting: Radiation Oncology

## 2015-10-25 DIAGNOSIS — Z510 Encounter for antineoplastic radiation therapy: Secondary | ICD-10-CM | POA: Diagnosis not present

## 2015-10-26 ENCOUNTER — Ambulatory Visit
Admission: RE | Admit: 2015-10-26 | Discharge: 2015-10-26 | Disposition: A | Discharge status: Home / Self Care | Payer: Medicare Other | Source: Ambulatory Visit | Attending: Radiation Oncology | Admitting: Radiation Oncology

## 2015-10-26 ENCOUNTER — Encounter: Payer: Self-pay | Admitting: Radiation Oncology

## 2015-10-26 ENCOUNTER — Ambulatory Visit: Payer: Medicare Other

## 2015-10-26 VITALS — BP 120/67 | HR 61 | Temp 98.2°F | Resp 18 | Ht 68.0 in | Wt 282.5 lb

## 2015-10-26 DIAGNOSIS — R5383 Other fatigue: Secondary | ICD-10-CM | POA: Insufficient documentation

## 2015-10-26 DIAGNOSIS — Z923 Personal history of irradiation: Secondary | ICD-10-CM | POA: Insufficient documentation

## 2015-10-26 DIAGNOSIS — Z510 Encounter for antineoplastic radiation therapy: Secondary | ICD-10-CM | POA: Diagnosis not present

## 2015-10-26 DIAGNOSIS — C50511 Malignant neoplasm of lower-outer quadrant of right female breast: Secondary | ICD-10-CM

## 2015-10-26 MED ORDER — RADIAPLEXRX EX GEL
Freq: Once | CUTANEOUS | Status: AC
  Administered 2015-10-26: 10:00:00 via TOPICAL

## 2015-10-26 NOTE — Progress Notes (Signed)
Brandi Rodgers has completed 33 fractions to her right breast.  She denies having pain other that a scratchy throat that started 2 weeks ago.  She thinks it may be due allergies.  She reports having slight fatigue.  The skin on her right breast is red with a peeling area underneath her breast.  She is using radiaplex and has been given a refill.  She also uses neosporin on the peeling area.  She has been given a one month follow up appointment.  BP 120/67 mmHg  Pulse 61  Temp(Src) 98.2 F (36.8 C) (Oral)  Resp 18  Ht 5\' 8"  (1.727 m)  Wt 282 lb 8 oz (128.141 kg)  BMI 42.96 kg/m2   Wt Readings from Last 3 Encounters:  10/26/15 282 lb 8 oz (128.141 kg)  10/19/15 280 lb (127.007 kg)  10/13/15 276 lb 3.2 oz (125.283 kg)

## 2015-10-26 NOTE — Progress Notes (Signed)
  Radiation Oncology         [phone] ________________________________   Name: Brandi Rodgers MRN: [id number]  Date: 10/26/2015  DOB: 1951/03/01  Weekly Radiation Therapy Management    ICD-9-CM ICD-10-CM   1. Breast cancer of lower-outer quadrant of right female breast (HCC) 174.5 C50.511 hyaluronate sodium (RADIAPLEXRX) gel     Current Dose: 60.4 Gy     Planned Dose:  60.4 Gy  Narrative . . . . . . . . The patient presents for routine under treatment assessment.                                  Everlean Kutscher has completed 33 fractions to her right breast. She denies having pain other that a scratchy throat that started 2 weeks ago. She thinks it may be due allergies. She reports having slight fatigue. The skin on her right breast is red with a peeling area underneath her breast. She is using radiaplex and has been given a refill. She also uses neosporin on the peeling area. She has been given a one month follow up appointment.                                  Set-up films were reviewed.                                 The chart was checked. Physical Findings. . .  height is 5\' 8"  (1.727 m) and weight is 282 lb 8 oz (128.141 kg). Her oral temperature is 98.2 F (36.8 C). Her blood pressure is 120/67 and her pulse is 61. Her respiration is 18. .Lungs are clear to auscultation bilaterally. Heart has regular rate and rhythm. No palpable cervical, supraclavicular, or axillary adenopathy. Abdomen soft, non-tender, normal bowel sounds. Healing mild moist desquamation in the right inframammary fold.  No signs of infection. Impression . . . . . . . The patient is tolerating radiation. Plan . . . . . . . . . . . . Routine follow-up in 4-6 weeks.  ________________________________   Blair Promise, PhD, MD

## 2015-10-27 ENCOUNTER — Other Ambulatory Visit: Payer: Self-pay | Admitting: *Deleted

## 2015-10-27 MED ORDER — APIXABAN 5 MG PO TABS: ORAL_TABLET | ORAL | Status: DC

## 2015-10-28 ENCOUNTER — Encounter: Payer: Self-pay | Admitting: *Deleted

## 2015-10-28 NOTE — Progress Notes (Signed)
  Radiation Oncology         [phone] ________________________________  Name: Brandi Rodgers MRN: [id number]  Date: 10/26/2015  DOB: [birth year]  End of Treatment Note  Diagnosis: T1bN0 Stage 1A Invasive Ductal Carcinoma of the Right Breast (ER/PR positive, HER2 negative)     Indication for treatment:  Curative        Radiation treatment dates:   09/07/15- 10/26/15  Site/dose:    1) Right breast treated to 50.4 Gy in 28 fractions 2) Right breast boost treated to 10 Gy in 5 fractions   Beams/energy:    1) Conformal / 10X 2) Isodose Plan / 10X, 6X   Narrative: The patient tolerated radiation treatment relatively well. During treatment the patient commonly experienced erythema in the right breast area. She managed this well with Radiaplex and Neosporin. She also had occasional sharp pains in her right breast.   Plan: The patient has completed radiation treatment. The patient will return to radiation oncology clinic for routine followup in one month. I advised them to call or return sooner if they have any questions or concerns related to their recovery or treatment.  -----------------------------------  Blair Promise, PhD, MD  This document serves as a record of services personally performed by Gery Pray, MD. It was created on his behalf by Derek Mound, a trained medical scribe. The creation of this record is based on the scribe's personal observations and the provider's statements to them. This document has been checked and approved by the attending provider.

## 2015-10-29 ENCOUNTER — Telehealth: Payer: Self-pay | Admitting: Cardiovascular Disease

## 2015-10-29 NOTE — Telephone Encounter (Signed)
Ne message    Request for surgical clearance:  1. What type of surgery is being performed? Back injecion  2. When is this surgery scheduled? pending  3. Are there any medications that need to be held prior to surgery and how long?How long to be off Eliquis  4. Name of physician performing surgery? Dr. Nelva Bush   5. What is your office phone and fax number? Fax number [phone] number [phone] ext [phone]

## 2015-11-01 ENCOUNTER — Ambulatory Visit (HOSPITAL_BASED_OUTPATIENT_CLINIC_OR_DEPARTMENT_OTHER): Payer: Medicare Other | Admitting: Hematology and Oncology

## 2015-11-01 ENCOUNTER — Telehealth: Payer: Self-pay | Admitting: Hematology and Oncology

## 2015-11-01 ENCOUNTER — Encounter: Payer: Self-pay | Admitting: Hematology and Oncology

## 2015-11-01 VITALS — BP 141/74 | HR 68 | Temp 98.7°F | Resp 19 | Wt 281.7 lb

## 2015-11-01 DIAGNOSIS — Z79811 Long term (current) use of aromatase inhibitors: Secondary | ICD-10-CM

## 2015-11-01 DIAGNOSIS — Z170 Estrogen receptor positive status [ER+]: Secondary | ICD-10-CM

## 2015-11-01 DIAGNOSIS — C50511 Malignant neoplasm of lower-outer quadrant of right female breast: Secondary | ICD-10-CM | POA: Diagnosis not present

## 2015-11-01 MED ORDER — ANASTROZOLE 1 MG PO TABS: 1.0000 mg | ORAL_TABLET | Freq: Every day | ORAL | Status: DC

## 2015-11-01 NOTE — Progress Notes (Signed)
Patient Care Team: Jonathon Jordan, MD as PCP - General (Family Medicine) Sylvan Cheese, NP as Nurse Practitioner (Hematology and Oncology)  DIAGNOSIS: Breast cancer of lower-outer quadrant of right female breast [facility])   Staging form: Breast, AJCC 7th Edition     Clinical stage from 07/07/2015: Stage IA (T1a, N0, M0) - Unsigned   SUMMARY OF ONCOLOGIC HISTORY:   Breast cancer of lower-outer quadrant of right female breast ([place])   06/25/2015 Initial Diagnosis Right breast screening det focal asymmetry plus calcs 4 mm at 8:00 axilla neg, 3-D biopsy: Grade 1 IDC with DCIS and calcifications, ER 95%, PR 90% HER-2 negative ratio 1.70, KI 67:15%, clinical stage: T1aN0 Stage 1A    07/16/2015 Surgery Rt Lumpectomy: IDC grade 2, 0.6 cm, with DCIS, 0/4 LN, ER 95%, PR 90% HER-2 negative ratio 1.70, KI 67:15%, Path stage: T1aN0 Stage 1A , Oncotype DX score 19, 12% ROR   09/08/2015 - 10/26/2015 Radiation Therapy Adjuvant radiation therapy   11/01/2015 -  Anti-estrogen oral therapy Anastrozole 1 mg daily 5 years    CHIEF COMPLIANT: Follow-up after completing radiation therapy  INTERVAL HISTORY: Brandi Rodgers is a 65 year old with above-mentioned history of right breast cancer. With lumpectomy and radiation and is here to discuss starting anastrozole therapy. She done fairly well from radiation standpoint she did have mild discomfort from the axilla. Denies any lumps or nodules.  REVIEW OF SYSTEMS:   Constitutional: Denies fevers, chills or abnormal weight loss Eyes: Denies blurriness of vision Ears, nose, mouth, throat, and face: Denies mucositis or sore throat Respiratory: Denies cough, dyspnea or wheezes Cardiovascular: Denies palpitation, chest discomfort Gastrointestinal:  Denies nausea, heartburn or change in bowel habits Skin: Denies abnormal skin rashes Lymphatics: Denies new lymphadenopathy or easy bruising Neurological:Denies numbness, tingling or new  weaknesses Behavioral/Psych: Mood is stable, no new changes  Extremities: No lower extremity edema Breast:  denies any pain or lumps or nodules in either breasts All other systems were reviewed with the patient and are negative.  I have reviewed the past medical history, past surgical history, social history and family history with the patient and they are unchanged from previous note.  ALLERGIES:  is allergic to adhesive and methocarbamol.  MEDICATIONS:  Current Outpatient Prescriptions  Medication Sig Dispense Refill  . acetaminophen (TYLENOL) 325 MG tablet     . albuterol (PROVENTIL HFA) 108 (90 Base) MCG/ACT inhaler Reported on 10/13/2015    . anastrozole (ARIMIDEX) 1 MG tablet Take 1 tablet (1 mg total) by mouth daily. 90 tablet 3  . apixaban (ELIQUIS) 5 MG TABS tablet TAKE 1 TABLET (5 MG TOTAL) BY MOUTH 2 (TWO) TIMES DAILY. 180 tablet 1  . atenolol (TENORMIN) 50 MG tablet Take 50 mg by mouth daily.     [name] BIOTIN 5000 PO Take 1 tablet by mouth daily.    [name] bismuth subsalicylate (PEPTO BISMOL) 262 MG chewable tablet Chew 524 mg by mouth as needed.    . Calcium-Vitamin D-Vitamin K (VIACTIV PO) Take 1 tablet by mouth daily.    . fluticasone (CUTIVATE) 0.05 % cream Reported on 10/13/2015  0  . furosemide (LASIX) 20 MG tablet Take 20 mg by mouth daily as needed for fluid. Reported on 10/13/2015    . gabapentin (NEURONTIN) 600 MG tablet Take 600 mg by mouth 3 (three) times daily.    . hyaluronate sodium (RADIAPLEXRX) GEL Apply 1 application topically 2 (two) times daily.    . hydrocortisone cream 1 % Apply 1 application topically 2 (  two) times daily.    . Multiple Vitamin (MULTIVITAMIN) tablet Take 1 tablet by mouth daily.    . non-metallic deodorant Jethro Poling) MISC Apply 1 application topically daily as needed.    . ranitidine (ZANTAC) 75 MG tablet Take 75 mg by mouth 2 (two) times daily as needed for heartburn. Reported on 09/14/2015    . rizatriptan (MAXALT-MLT) 10 MG disintegrating tablet Take  10 mg by mouth as needed for migraine. Reported on 09/14/2015    . traMADol (ULTRAM) 50 MG tablet Take by mouth every 6 (six) hours as needed.     No current facility-administered medications for this visit.    PHYSICAL EXAMINATION: ECOG PERFORMANCE STATUS: 0 - Asymptomatic  Filed Vitals:   11/01/15 1033  BP: 141/74  Pulse: 68  Temp: 98.7 F (37.1 C)  Resp: 19   Filed Weights   11/01/15 1033  Weight: 281 lb 11.2 oz (127.778 kg)    GENERAL:alert, no distress and comfortable SKIN: skin color, texture, turgor are normal, no rashes or significant lesions EYES: normal, Conjunctiva are pink and non-injected, sclera clear OROPHARYNX:no exudate, no erythema and lips, buccal mucosa, and tongue normal  NECK: supple, thyroid normal size, non-tender, without nodularity LYMPH:  no palpable lymphadenopathy in the cervical, axillary or inguinal LUNGS: clear to auscultation and percussion with normal breathing effort HEART: regular rate & rhythm and no murmurs and no lower extremity edema ABDOMEN:abdomen soft, non-tender and normal bowel sounds MUSCULOSKELETAL:no cyanosis of digits and no clubbing  NEURO: alert & oriented x 3 with fluent speech, no focal motor/sensory deficits EXTREMITIES: No lower extremity edema  LABORATORY DATA:  I have reviewed the data as listed   Chemistry      Component Value Date/Time   NA 142 07/07/2015 1226   NA 138 08/23/2014 0426   K 4.0 07/07/2015 1226   K 4.6 08/23/2014 0426   CL 103 08/23/2014 0426   CO2 29 07/07/2015 1226   CO2 30 08/23/2014 0426   BUN 10.9 07/07/2015 1226   BUN 11 08/23/2014 0426   CREATININE 0.8 07/07/2015 1226   CREATININE 0.57 08/23/2014 0426      Component Value Date/Time   CALCIUM 10.0 07/07/2015 1226   CALCIUM 8.8 08/23/2014 0426   ALKPHOS 127 07/07/2015 1226   ALKPHOS 87 08/13/2014 1020   AST 22 07/07/2015 1226   AST 42* 08/13/2014 1020   ALT 18 07/07/2015 1226   ALT 39* 08/13/2014 1020   BILITOT 0.52 07/07/2015 1226    BILITOT 0.8 08/13/2014 1020       Lab Results  Component Value Date   WBC 8.7 07/07/2015   HGB 12.8 07/07/2015   HCT 38.8 07/07/2015   MCV 79.4* 07/07/2015   PLT 253 07/07/2015   NEUTROABS 6.6* 07/07/2015     ASSESSMENT & PLAN:  Breast cancer of lower-outer quadrant of right female breast ([place]) Rt Lumpectomy 07/16/15: IDC grade 2, 0.6 cm, with DCIS, 0/4 LN, ER 95%, PR 90% HER-2 negative ratio 1.70, KI 67:15%, Path stage: T1aN0 Stage 1A Oncotype DX score 19, 12% risk of recurrence Status post radiation 09/08/2015 to 10/26/2015  Current treatment: Recommended starting antiestrogen therapy with anastrozole 1 mg daily 5 years  Anastrozole counseling: We discussed the risks and benefits of anti-estrogen therapy with aromatase inhibitors. These include but not limited to insomnia, hot flashes, mood changes, vaginal dryness, bone density loss, and weight gain. We strongly believe that the benefits far outweigh the risks. Patient understands these risks and consented to starting treatment. Planned  treatment duration is 5 years.  Return to clinic in 3 months for toxicity check and follow up.  No orders of the defined types were placed in this encounter.   The patient has a good understanding of the overall plan. she agrees with it. she will call with any problems that may develop before the next visit here.   Rulon Eisenmenger, MD 11/01/2015

## 2015-11-01 NOTE — Assessment & Plan Note (Signed)
Rt Lumpectomy 07/16/15: IDC grade 2, 0.6 cm, with DCIS, 0/4 LN, ER 95%, PR 90% HER-2 negative ratio 1.70, KI 67:15%, Path stage: T1aN0 Stage 1A Oncotype DX score 19, 12% risk of recurrence Status post radiation 09/08/2015 to 10/26/2015  Current treatment: Recommended starting antiestrogen therapy with anastrozole 1 mg daily 5 years  Anastrozole counseling: We discussed the risks and benefits of anti-estrogen therapy with aromatase inhibitors. These include but not limited to insomnia, hot flashes, mood changes, vaginal dryness, bone density loss, and weight gain. We strongly believe that the benefits far outweigh the risks. Patient understands these risks and consented to starting treatment. Planned treatment duration is 5 years.  Return to clinic in 3 months for toxicity check and follow

## 2015-11-01 NOTE — Telephone Encounter (Signed)
appt made and avs printed °

## 2015-11-16 ENCOUNTER — Encounter: Payer: Self-pay | Admitting: Cardiovascular Disease

## 2015-11-16 NOTE — Telephone Encounter (Signed)
Sent via epic 

## 2015-12-03 ENCOUNTER — Other Ambulatory Visit: Payer: Self-pay | Admitting: *Deleted

## 2015-12-03 DIAGNOSIS — C50511 Malignant neoplasm of lower-outer quadrant of right female breast: Secondary | ICD-10-CM

## 2015-12-04 ENCOUNTER — Telehealth: Payer: Self-pay | Admitting: Hematology and Oncology

## 2015-12-04 NOTE — Telephone Encounter (Signed)
Lvm advising appt 9/27 for survivorship clinic.

## 2015-12-08 ENCOUNTER — Encounter: Payer: Self-pay | Admitting: Oncology

## 2015-12-09 ENCOUNTER — Encounter: Payer: Self-pay | Admitting: Radiation Oncology

## 2015-12-09 ENCOUNTER — Ambulatory Visit
Admission: RE | Admit: 2015-12-09 | Discharge: 2015-12-09 | Disposition: A | Discharge status: Home / Self Care | Payer: Medicare Other | Source: Ambulatory Visit | Attending: Radiation Oncology | Admitting: Radiation Oncology

## 2015-12-09 VITALS — BP 126/54 | HR 65 | Temp 97.7°F | Ht 68.0 in | Wt 286.2 lb

## 2015-12-09 DIAGNOSIS — C50511 Malignant neoplasm of lower-outer quadrant of right female breast: Secondary | ICD-10-CM | POA: Diagnosis not present

## 2015-12-09 DIAGNOSIS — Y842 Radiological procedure and radiotherapy as the cause of abnormal reaction of the patient, or of later complication, without mention of misadventure at the time of the procedure: Secondary | ICD-10-CM | POA: Insufficient documentation

## 2015-12-09 DIAGNOSIS — Z170 Estrogen receptor positive status [ER+]: Secondary | ICD-10-CM | POA: Insufficient documentation

## 2015-12-09 NOTE — Progress Notes (Signed)
Brandi Rodgers here for follow up.  She denies pain now but did have tenderness in her nipple area last week.  She also reports having soreness in her ribs under her right breast from her bra at times.  She is taking Arimidex and reports having some stomach discomfort with occasional diarrhea.  She has been taking pepto bismol for this.  Recommended trying Imodium.  She reports having a good energy level. The skin on her right breast is intact.   BP (!) 126/54 (BP Location: Left Arm, Patient Position: Sitting)   Pulse 65   Temp 97.7 F (36.5 C) (Oral)   Ht 5\' 8"  (1.727 m)   Wt 286 lb 3.2 oz (129.8 kg)   SpO2 98%   BMI 43.52 kg/m    Wt Readings from Last 3 Encounters:  12/09/15 286 lb 3.2 oz (129.8 kg)  11/01/15 281 lb 11.2 oz (127.8 kg)  10/26/15 282 lb 8 oz (128.1 kg)

## 2015-12-09 NOTE — Addendum Note (Signed)
Encounter addended by: Karen R Hess, RN on: 12/09/2015  9:31 AM<BR>    Actions taken: Charge Capture section accepted

## 2015-12-09 NOTE — Progress Notes (Signed)
Radiation Oncology         [phone] ________________________________  Name: Brandi Rodgers MRN: [id number]  Date: 12/09/2015  DOB: 1950-11-14      Follow-Up Visit Note  CC: [name], MD  [name], MD   Diagnosis: T1bN0 Stage 1A Invasive Ductal Carcinoma of the Right Breast (ER/PR positive, HER2 negative)     Interval Since Last Radiation:  One month, two weeks, (10/26/15)  Narrative:  The patient returns today for routine follow-up.  She denies pain now but did have tenderness in her nipple area last week.  She also reports having soreness in her ribs under her right breast from her bra at times.  She is taking Arimidex and reports having some stomach discomfort with occasional diarrhea.  She has been taking pepto bismol for this.  Recommended trying Imodium.  She reports having a good energy level. The skin on her right breast is intact. The patient reports that using a different type of bra has alleviated any pain.  The patient denies nipple discharge.                               ALLERGIES:  is allergic to adhesive [tape] and methocarbamol.  Meds: Current Outpatient Prescriptions  Medication Sig Dispense Refill  . acetaminophen (TYLENOL) 325 MG tablet     . anastrozole (ARIMIDEX) 1 MG tablet Take 1 tablet (1 mg total) by mouth daily. 90 tablet 3  . apixaban (ELIQUIS) 5 MG TABS tablet TAKE 1 TABLET (5 MG TOTAL) BY MOUTH 2 (TWO) TIMES DAILY. 180 tablet 1  . atenolol (TENORMIN) 50 MG tablet Take 50 mg by mouth daily.     [name] BIOTIN 5000 PO Take 1 tablet by mouth daily.    [name] bismuth subsalicylate (PEPTO BISMOL) 262 MG chewable tablet Chew 524 mg by mouth as needed.    . Calcium-Vitamin D-Vitamin K (VIACTIV PO) Take 1 tablet by mouth daily.    [name] gabapentin (NEURONTIN) 600 MG tablet Take 600 mg by mouth 3 (three) times daily.    . Multiple Vitamin (MULTIVITAMIN) tablet Take 1 tablet by mouth daily.    . ranitidine (ZANTAC) 75 MG tablet Take 75 mg by mouth 2  (two) times daily as needed for heartburn. Reported on 09/14/2015    . rizatriptan (MAXALT-MLT) 10 MG disintegrating tablet Take 10 mg by mouth as needed for migraine. Reported on 09/14/2015    . traMADol (ULTRAM) 50 MG tablet Take by mouth every 6 (six) hours as needed.    [name] albuterol (PROVENTIL HFA) 108 (90 Base) MCG/ACT inhaler Reported on 10/13/2015    . fluticasone (CUTIVATE) 0.05 % cream Reported on 10/13/2015  0  . furosemide (LASIX) 20 MG tablet Take 20 mg by mouth daily as needed for fluid. Reported on 10/13/2015    . hyaluronate sodium (RADIAPLEXRX) GEL Apply 1 application topically 2 (two) times daily.    . hydrocortisone cream 1 % Apply 1 application topically 2 (two) times daily.    . non-metallic deodorant Jethro Poling) MISC Apply 1 application topically daily as needed.     No current facility-administered medications for this encounter.     Physical Findings: The patient is in no acute distress. Patient is alert and oriented.  height is _0  (1.727 m) and weight is 286 lb 3.2 oz (129.8 kg). Her oral temperature is 97.7 F (36.5 C). Her blood pressure is 126/54 (abnormal) and her pulse is 65.  Her oxygen saturation is 98%. .   Lungs are clear to auscultation bilaterally. Heart has regular rate and rhythm. No palpable cervical, supraclavicular, or axillary adenopathy. Abdomen soft, non-tender, normal bowel sounds. The patient has very mild pigmentation changes in the right breast.  No other palpable masses appreciated or nipple bleeding.    Lab Findings: Lab Results  Component Value Date   WBC 8.7 07/07/2015   HGB 12.8 07/07/2015   HCT 38.8 07/07/2015   MCV 79.4 (L) 07/07/2015   PLT 253 07/07/2015    Radiographic Findings: No results found.  Impression:  The patient is recovering from the effects of radiation.  No evidence of recurrence on clinical exam today  Plan:  I will follow up with the patient in about six months.    ____________________________________   This document  serves as a record of services personally performed by Gery Pray, MD. It was created on his behalf by Truddie Hidden, a trained medical scribe. The creation of this record is based on the scribe's personal observations and the provider's statements to them. This document has been checked and approved by the attending provider.

## 2015-12-20 ENCOUNTER — Telehealth: Payer: Self-pay | Admitting: Cardiovascular Disease

## 2015-12-20 NOTE — Telephone Encounter (Signed)
Called Manuela Schwartz back. She stated the patient would only need to be off the Eliquis for 3 days. Let her know patient has appt with Dr Sallyanne Kuster on 12/27/15. Patient is former Dr Mare Ferrari patient.  Will route to Dr Sallyanne Kuster for clearance.

## 2015-12-20 NOTE — Telephone Encounter (Signed)
New message   Office is requesting to fax back clearance note  Request for surgical clearance:  1. What type of surgery is being performed? Neurotomy  2. When is this surgery scheduled? 8.18.2017    3. Are there any medications that need to be held prior to surgery and how long? eliquis 5 days prior   4. Name of physician performing surgery? Dr. Nelva Bush   5. What is your office phone and fax number? [phone]

## 2015-12-21 ENCOUNTER — Encounter: Payer: Self-pay | Admitting: Cardiovascular Disease

## 2015-12-21 NOTE — Telephone Encounter (Signed)
Letter sent via epic. Stop 3 days before the procedure, should be more than enough for anticoagulant effect to resolve completely.

## 2015-12-21 NOTE — Telephone Encounter (Signed)
Letter and encounter routed via EPIC to number provided.

## 2015-12-24 ENCOUNTER — Telehealth: Payer: Self-pay | Admitting: Hematology and Oncology

## 2015-12-24 NOTE — Telephone Encounter (Signed)
pt called to resched 9/27 apt , Brandi Rodgers confirmed apts w/ pt

## 2015-12-27 ENCOUNTER — Encounter: Payer: Self-pay | Admitting: Cardiovascular Disease

## 2015-12-27 ENCOUNTER — Ambulatory Visit (INDEPENDENT_AMBULATORY_CARE_PROVIDER_SITE_OTHER): Payer: Medicare Other | Admitting: Cardiovascular Disease

## 2015-12-27 VITALS — BP 134/82 | HR 62 | Ht 68.0 in | Wt 288.0 lb

## 2015-12-27 DIAGNOSIS — G43009 Migraine without aura, not intractable, without status migrainosus: Secondary | ICD-10-CM | POA: Diagnosis not present

## 2015-12-27 DIAGNOSIS — I480 Paroxysmal atrial fibrillation: Secondary | ICD-10-CM

## 2015-12-27 DIAGNOSIS — Z7901 Long term (current) use of anticoagulants: Secondary | ICD-10-CM | POA: Diagnosis not present

## 2015-12-27 DIAGNOSIS — I272 Other secondary pulmonary hypertension: Secondary | ICD-10-CM

## 2015-12-27 DIAGNOSIS — I2721 Secondary pulmonary arterial hypertension: Secondary | ICD-10-CM

## 2015-12-27 DIAGNOSIS — E668 Other obesity: Secondary | ICD-10-CM | POA: Insufficient documentation

## 2015-12-27 DIAGNOSIS — Z6838 Body mass index (BMI) 38.0-38.9, adult: Secondary | ICD-10-CM

## 2015-12-27 LAB — BASIC METABOLIC PANEL
BUN: 9 mg/dL (ref 7–25)
CHLORIDE: 102 mmol/L (ref 98–110)
CO2: 32 mmol/L — AB (ref 20–31)
Calcium: 9.6 mg/dL (ref 8.6–10.4)
Creat: 0.83 mg/dL (ref 0.50–0.99)
Glucose, Bld: 98 mg/dL (ref 65–99)
POTASSIUM: 4.1 mmol/L (ref 3.5–5.3)
SODIUM: 141 mmol/L (ref 135–146)

## 2015-12-27 LAB — CBC
HCT: 39.9 % (ref 35.0–45.0)
HEMOGLOBIN: 13.2 g/dL (ref 11.7–15.5)
MCH: 28.7 pg (ref 27.0–33.0)
MCHC: 33.1 g/dL (ref 32.0–36.0)
MCV: 86.7 fL (ref 80.0–100.0)
MPV: 10.1 fL (ref 7.5–12.5)
Platelets: 229 10*3/uL (ref 140–400)
RBC: 4.6 MIL/uL (ref 3.80–5.10)
RDW: 15 % (ref 11.0–15.0)
WBC: 8.3 10*3/uL (ref 3.8–10.8)

## 2015-12-27 NOTE — Patient Instructions (Signed)
Dr Sallyanne Kuster recommends that you continue on your current medications as directed. Please refer to the Current Medication list given to you today.  Your physician recommends that you return for lab work at your earliest convenience.  Dr Sallyanne Kuster recommends that you schedule a follow-up appointment in 6 months. You will receive a reminder letter in the mail two months in advance. If you don't receive a letter, please call our office to schedule the follow-up appointment.  If you need a refill on your cardiac medications before your next appointment, please call your pharmacy.

## 2015-12-27 NOTE — Progress Notes (Signed)
Cardiology Office Note    Date:  12/27/2015   ID:  LAYDEN MCPHATTER, DOB 25-Oct-1950, MRN [id number]  PCP:  Brandi Coma, MD  Cardiologist:   Sanda Klein, MD   Chief complaint: Establish new cardiology follow-up.   History of Present Illness:  Brandi Rodgers is a 65 y.o. female with paroxysmal atrial fibrillation, previously followed by Dr. Warren Danes. She has a history of migraine headaches for which she takes atenolol but does not have hypertension (in fact when her atenolol was doubled for better migraine control she developed presyncope). Every 2-4 weeks or social have an episode of atrial fibrillation manifesting as palpitations, but in the last 6 months the symptoms have been less severe than before. She generally tolerates the episodes well, without dyspnea, angina or dizziness/syncope. She is compliant with anticoagulation with Eliquis and has not had any bleeding complications. She plans to interrupt her anticoagulant for a couple of days in order to undergo a spinal neurotomy procedure later this week.  She has morbid obesity and sleep apnea, but does not use CPAP. She does use a jaw advancement device and denies symptoms of daytime hypersomnolence. Echocardiogram performed in October 2015 showed normal left ventricular wall thickness and ejection fraction, normal left atrial size, but did show mild pulmonary hypertension with an estimated systolic PA pressure 41 mmHg. Has a history of right breast cancer status post surgery and right breast radiation therapy. She does not have angina pectoris or any known coronary or peripheral vascular obstructive lesions. She has mild hyperlipidemia which has been managed with diet alone. She has had problems with edema in the past but uses furosemide extremely rarely. She does not think she's taken any in the last 3 months.  Past Medical History:  Diagnosis Date  . Arthritis    cervical and lumbar spine  . Breast cancer of  lower-outer quadrant of right female breast ([place]) 07/02/2015  . Degenerative lumbar disc   . Dental crowns present   . GERD (gastroesophageal reflux disease)   . Hypercholesterolemia    borderline - no current med.  . Jaw clicking   . Migraines   . Osteoarthritis    bilateral knee  . Paroxysmal atrial fibrillation ([place])    last episode was 01/2014  . Radiation 09/07/15-10/26/15   right breast 50.4 Gy, boost to 10 Gy  . Sleep apnea    uses mouth guard at night; no CPAP    Past Surgical History:  Procedure Laterality Date  . CARPAL TUNNEL RELEASE Right 05/13/2003  . DILATION AND CURETTAGE OF UTERUS    . KNEE ARTHROSCOPY Right 04/06/2003  . KNEE ARTHROSCOPY Left 11/30/2003  . RADIOACTIVE SEED GUIDED MASTECTOMY WITH AXILLARY SENTINEL LYMPH NODE BIOPSY Right 07/16/2015   Procedure: RADIOACTIVE SEED GUIDED PARTIAL MASTECTOMY WITH AXILLARY SENTINEL LYMPH NODE BIOPSY;  Surgeon: [name], MD;  Location: [facility];  Service: General;  Laterality: Right;  . TOTAL KNEE ARTHROPLASTY Left 03/23/2014   Procedure: LEFT TOTAL KNEE ARTHROPLASTY ;  Surgeon: [name], MD;  [facility];  Service: Orthopedics;  Laterality: Left;  . TOTAL KNEE ARTHROPLASTY Right 08/21/2014   Procedure: RIGHT TOTAL KNEE ARTHROPLASTY;  Surgeon: Gaynelle Arabian, MD;  [facility];  Service: Orthopedics;  Laterality: Right;    Current Medications: Outpatient Medications Prior to Visit  Medication Sig Dispense Refill  . acetaminophen (TYLENOL) 325 MG tablet     . albuterol (PROVENTIL HFA) 108 (90 Base) MCG/ACT inhaler Reported on 10/13/2015    .  anastrozole (ARIMIDEX) 1 MG tablet Take 1 tablet (1 mg total) by mouth daily. 90 tablet 3  . apixaban (ELIQUIS) 5 MG TABS tablet TAKE 1 TABLET (5 MG TOTAL) BY MOUTH 2 (TWO) TIMES DAILY. 180 tablet 1  . atenolol (TENORMIN) 50 MG tablet Take 50 mg by mouth daily.     [name] BIOTIN 5000 PO Take 1 tablet by mouth daily.    [name] bismuth subsalicylate (PEPTO BISMOL)  262 MG chewable tablet Chew 524 mg by mouth as needed.    . Calcium-Vitamin D-Vitamin K (VIACTIV PO) Take 1 tablet by mouth daily.    . furosemide (LASIX) 20 MG tablet Take 20 mg by mouth daily as needed for fluid. Reported on 10/13/2015    . gabapentin (NEURONTIN) 600 MG tablet Take 600 mg by mouth 3 (three) times daily.    . hyaluronate sodium (RADIAPLEXRX) GEL Apply 1 application topically 2 (two) times daily.    . Multiple Vitamin (MULTIVITAMIN) tablet Take 1 tablet by mouth daily.    . non-metallic deodorant Jethro Poling) MISC Apply 1 application topically daily as needed.    . ranitidine (ZANTAC) 75 MG tablet Take 75 mg by mouth 2 (two) times daily as needed for heartburn. Reported on 09/14/2015    . rizatriptan (MAXALT-MLT) 10 MG disintegrating tablet Take 10 mg by mouth as needed for migraine. Reported on 09/14/2015    . traMADol (ULTRAM) 50 MG tablet Take by mouth every 6 (six) hours as needed.    . fluticasone (CUTIVATE) 0.05 % cream Reported on 10/13/2015  0  . hydrocortisone cream 1 % Apply 1 application topically 2 (two) times daily.     No facility-administered medications prior to visit.      Allergies:   Adhesive [tape] and Methocarbamol   Social History   Social History  . Marital status: Married    Spouse name: N/A  . Number of children: N/A  . Years of education: N/A   Social History Main Topics  . Smoking status: Never Smoker  . Smokeless tobacco: Never Used  . Alcohol use Yes     Comment: occasionally  . Drug use: No  . Sexual activity: Yes   Other Topics Concern  . None   Social History Narrative  . None     Family History:  The patient's family history includes Lung cancer in her mother; Thyroid cancer in her mother.   ROS:   Please see the history of present illness.    ROS All other systems reviewed and are negative.   PHYSICAL EXAM:   VS:  BP 134/82   Pulse 62   Ht 5\' 8"  (1.727 m)   Wt 288 lb (130.6 kg)   BMI 43.79 kg/m    GEN: Morbidly obese, well  developed, in no acute distress  HEENT: normal  Neck: no JVD, carotid bruits, or masses Cardiac: RRR; no murmurs, rubs, or gallops,no edema  Respiratory:  clear to auscultation bilaterally, normal work of breathing GI: soft, nontender, nondistended, + BS MS: no deformity or atrophy  Skin: warm and dry, no rash Neuro:  Alert and Oriented x 3, Strength and sensation are intact Psych: euthymic mood, full affect  Wt Readings from Last 3 Encounters:  12/27/15 288 lb (130.6 kg)  12/09/15 286 lb 3.2 oz (129.8 kg)  11/01/15 281 lb 11.2 oz (127.8 kg)      Studies/Labs Reviewed:   EKG:  EKG is ordered today.  The ekg ordered today demonstrates Normal sinus rhythm, poor R-wave progression,  QTC 371 ms  Recent Labs: 07/07/2015: ALT 18; BUN 10.9; Creatinine 0.8; HGB 12.8; Platelets 253; Potassium 4.0; Sodium 142     ASSESSMENT:    1. Paroxysmal atrial fibrillation (HCC)   2. Long term (current) use of anticoagulants   3. Morbid obesity due to excess calories ([place])   4. Nonintractable migraine, unspecified migraine type   5. Pulmonary arterial hypertension (HCC)      PLAN:  In order of problems listed above:  1. AFib: The burden of arrhythmias relatively low and she is not particular symptomatic. Higher doses of beta blocker were not well tolerated in the past. True antiarrhythmics do not appear justified at this point. 2. Embolic risk is high enough to justify chronic anticoagulation ([facility] 2 : age, gender), but low enough to where brief interruptions of anticoagulation for surgical procedure should be quite safe. Okay to stop the anticoagulant for planned spinal injection. 3. Morbid obesity: Direct correlation between weight and overall burden of atrial fibrillation. Continued efforts at weight loss are justified. 4. Migraines: Well controlled on beta blocker and as needed triptan. 5. PAH: Mild by previous echo. No edema at this time. Might be related to obesity and sleep  apnea.    Medication Adjustments/Labs and Tests Ordered: Current medicines are reviewed at length with the patient today.  Concerns regarding medicines are outlined above.  Medication changes, Labs and Tests ordered today are listed in the Patient Instructions below. Patient Instructions  Dr Sallyanne Kuster recommends that you continue on your current medications as directed. Please refer to the Current Medication list given to you today.  Your physician recommends that you return for lab work at your earliest convenience.  Dr Sallyanne Kuster recommends that you schedule a follow-up appointment in 6 months. You will receive a reminder letter in the mail two months in advance. If you don't receive a letter, please call our office to schedule the follow-up appointment.  If you need a refill on your cardiac medications before your next appointment, please call your pharmacy.    Signed, Sanda Klein, MD  12/27/2015 5:31 PM    [address], [place], [place]  [address] Phone: [phone]; Fax: [phone]

## 2016-01-10 IMAGING — CR DG CHEST 2V
2 series · 2 of 2 positions shown · non-contrast
Comparison: 01/29/2014.

CLINICAL DATA: Recent diagnosis of atrial fibrillation. Right hilar
fullness on the recent prior chest radiograph.

EXAM:
CHEST  2 VIEW

[w chest pa]
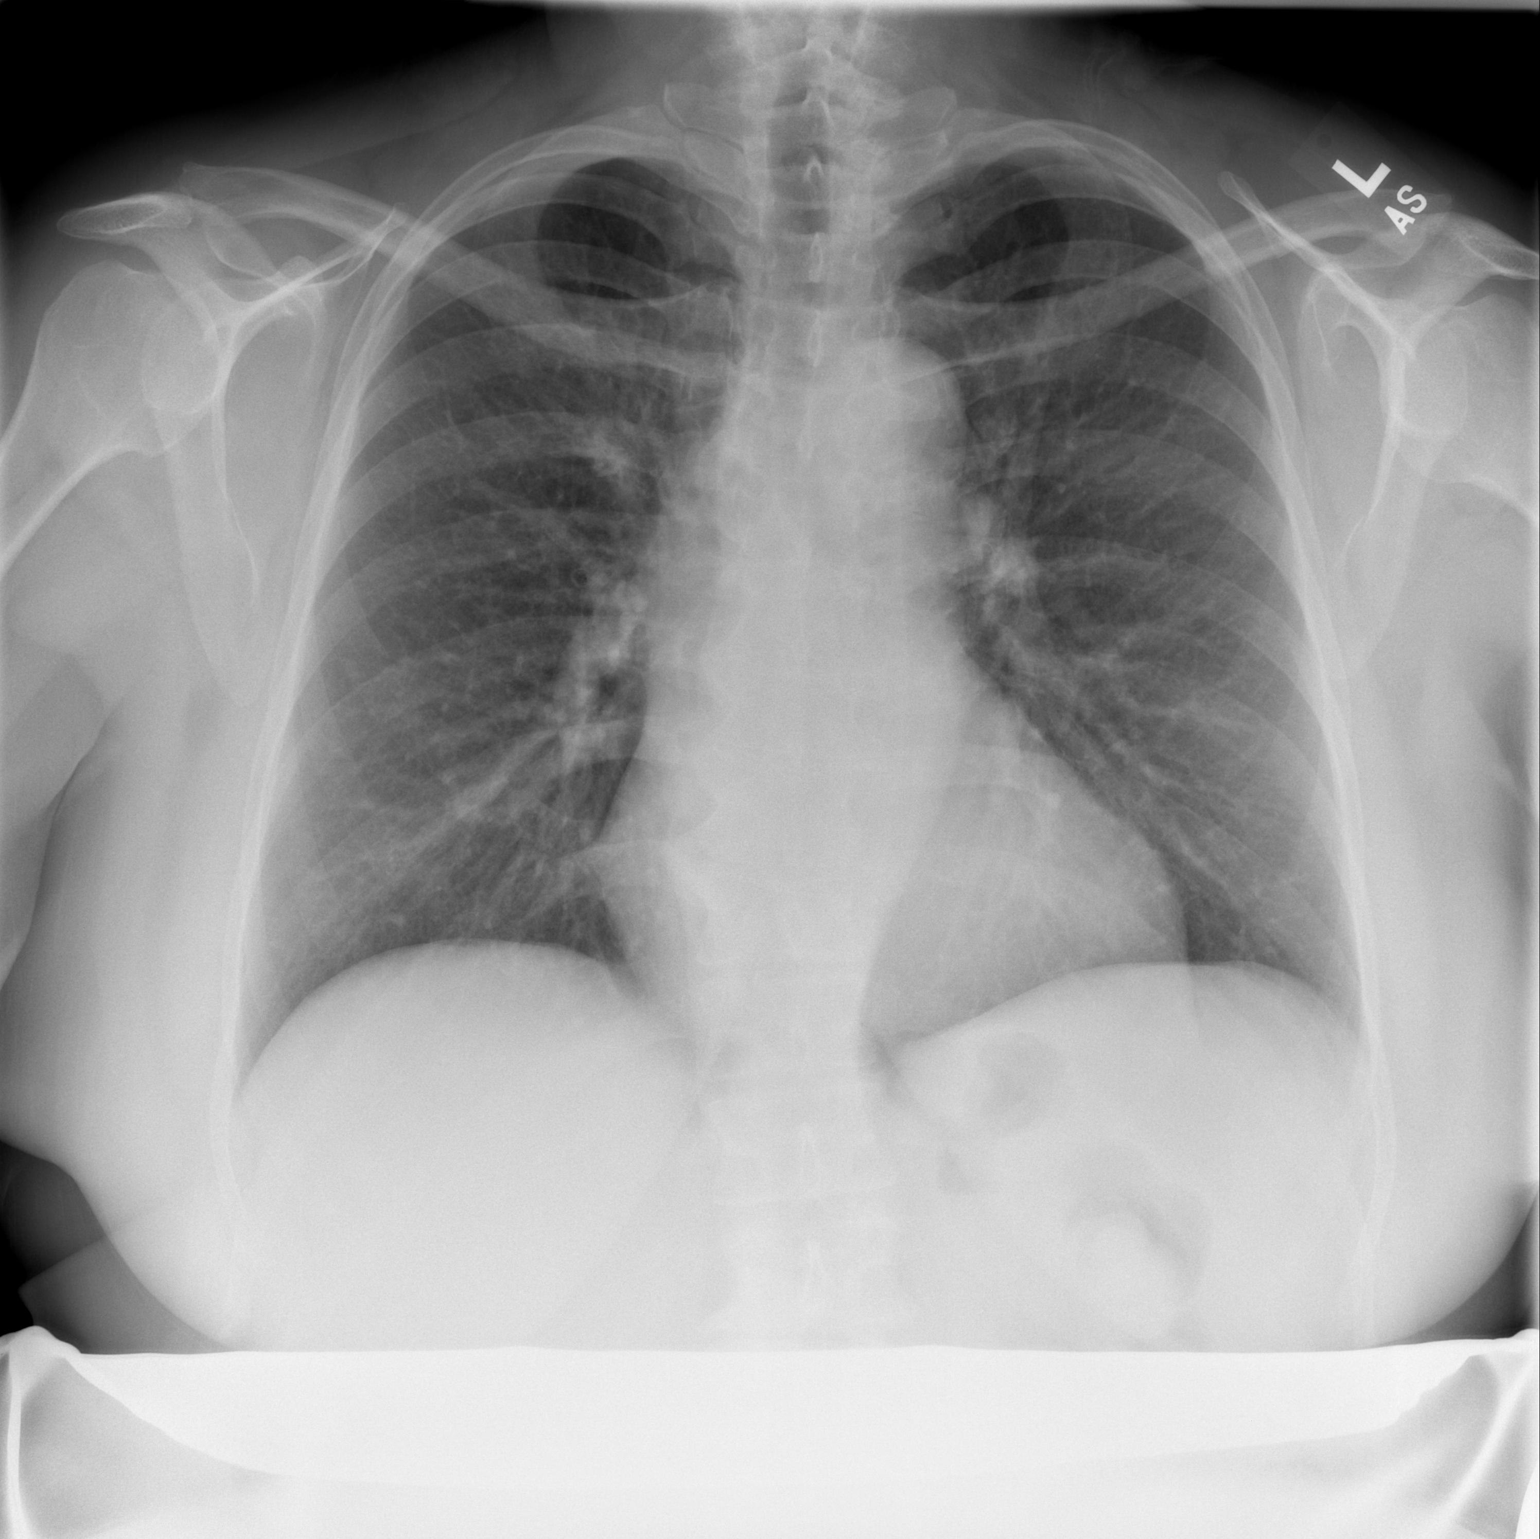

[w chest lat]
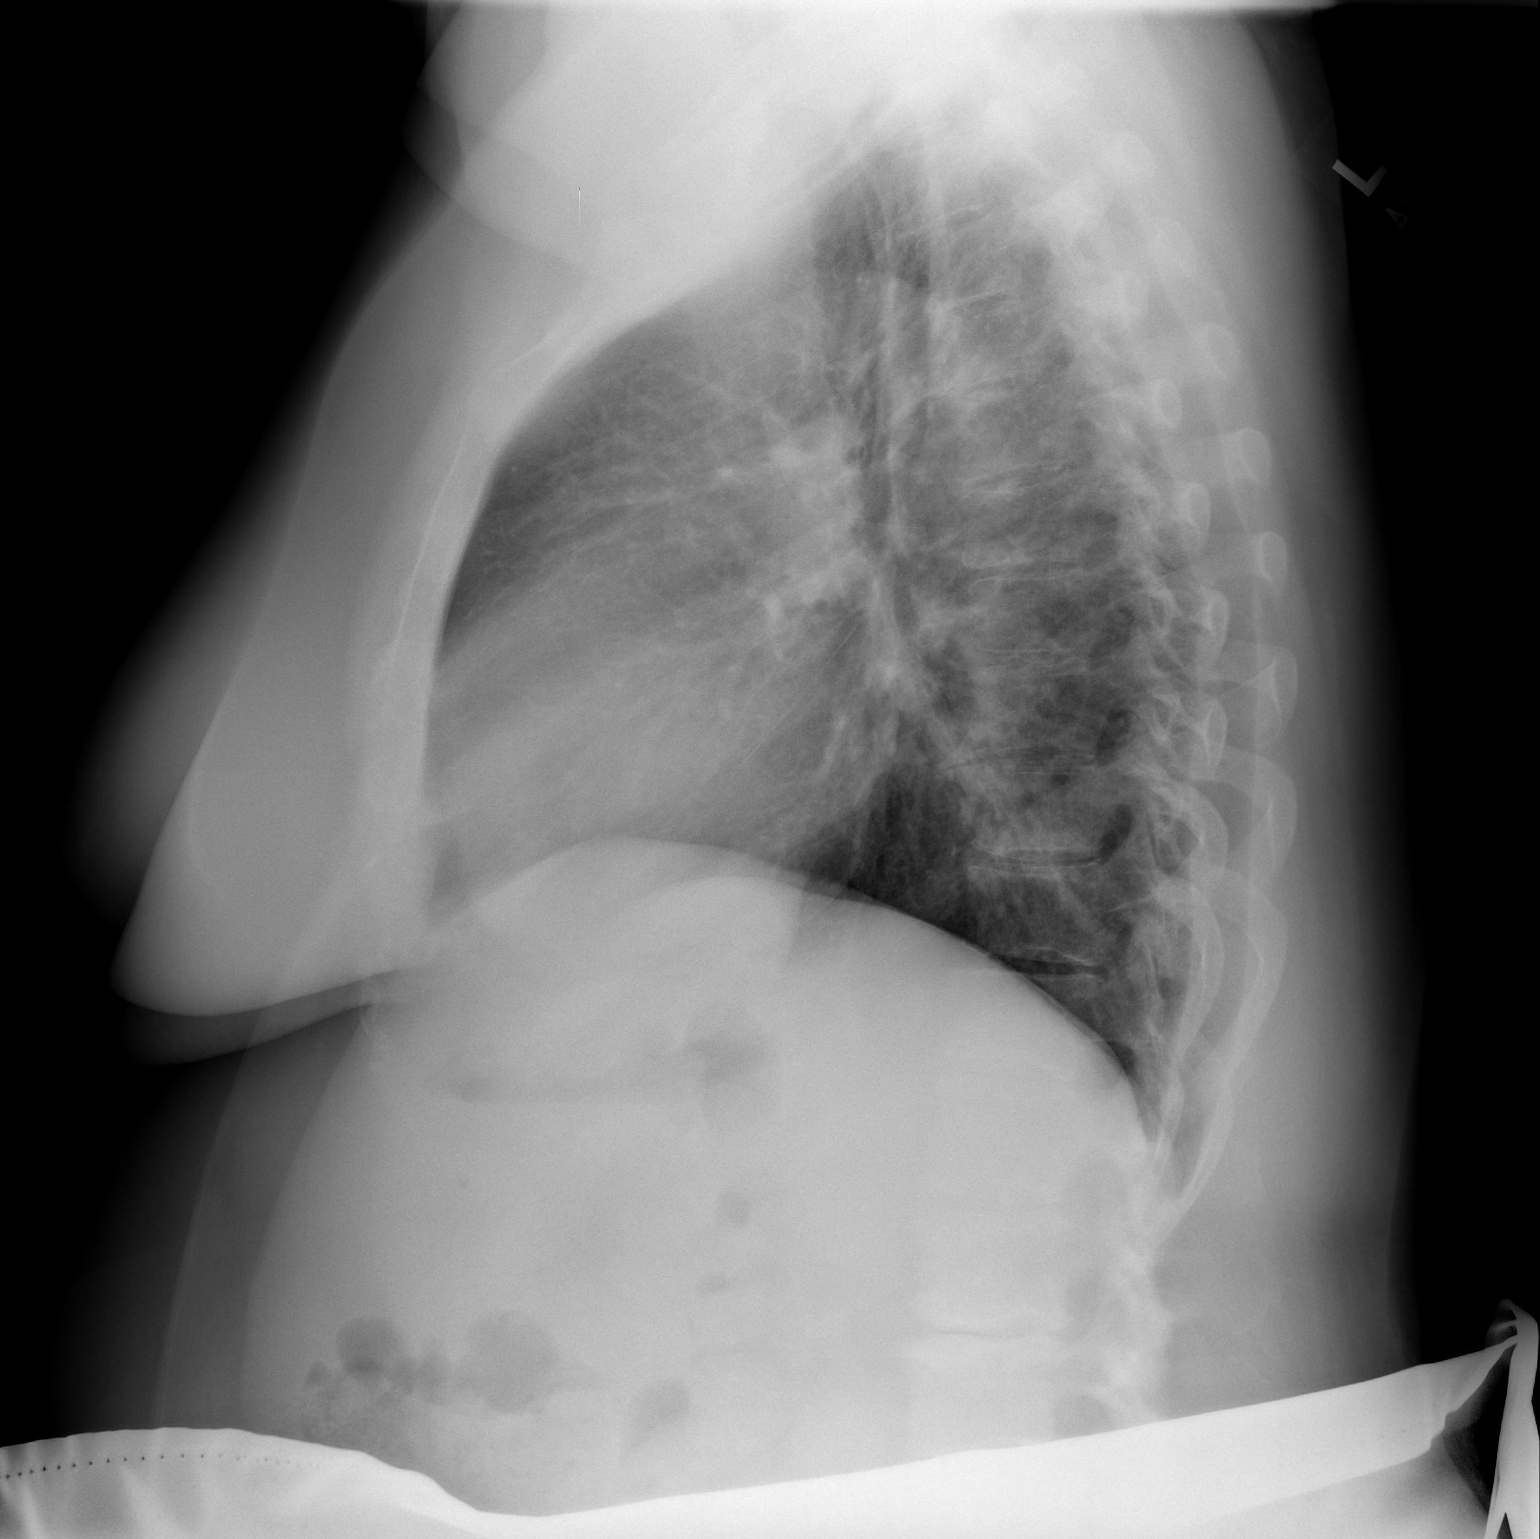

[2 of 2 positions shown; findings below may reference images not displayed]

FINDINGS: Cardiac silhouette is normal in size. Aorta is mildly uncoiled. No
mediastinal or hilar masses. No evidence of adenopathy. Hilar
fullness noted on the prior study was likely due to technique and
patient rotation to the right accentuating normal hilar vasculature.

Clear lungs.  No pleural effusion or pneumothorax.

Bony thorax is intact.
IMPRESSION: No active cardiopulmonary disease.

## 2016-02-01 ENCOUNTER — Ambulatory Visit: Payer: Medicare Other | Admitting: Hematology and Oncology

## 2016-02-09 ENCOUNTER — Ambulatory Visit: Payer: Medicare Other | Admitting: Hematology and Oncology

## 2016-02-09 ENCOUNTER — Encounter: Payer: Medicare Other | Admitting: Adult Health

## 2016-02-14 ENCOUNTER — Ambulatory Visit (HOSPITAL_BASED_OUTPATIENT_CLINIC_OR_DEPARTMENT_OTHER): Payer: Medicare Other | Admitting: Adult Health

## 2016-02-14 VITALS — BP 125/84 | HR 72 | Temp 98.2°F | Resp 18 | Ht 68.0 in | Wt 285.7 lb

## 2016-02-14 DIAGNOSIS — C50511 Malignant neoplasm of lower-outer quadrant of right female breast: Secondary | ICD-10-CM

## 2016-02-14 DIAGNOSIS — R232 Flushing: Secondary | ICD-10-CM

## 2016-02-14 DIAGNOSIS — Z79811 Long term (current) use of aromatase inhibitors: Secondary | ICD-10-CM

## 2016-02-14 DIAGNOSIS — T451X5A Adverse effect of antineoplastic and immunosuppressive drugs, initial encounter: Principal | ICD-10-CM

## 2016-02-14 MED ORDER — VENLAFAXINE HCL ER 37.5 MG PO CP24: 37.5000 mg | ORAL_CAPSULE | Freq: Every day | ORAL | 3 refills | Status: DC

## 2016-02-14 NOTE — Progress Notes (Signed)
CLINIC:  Survivorship   REASON FOR VISIT:  Routine follow-up post-treatment for a recent history of breast cancer.  BRIEF ONCOLOGIC HISTORY:    Breast cancer of lower-outer quadrant of right female breast ([place])   06/25/2015 Initial Diagnosis    Right breast screening det focal asymmetry plus calcs 4 mm at 8:00 axilla neg, 3-D biopsy: Grade 1 IDC with DCIS and calcifications, ER 95%, PR 90% HER-2 negative ratio 1.70, KI 67:15%, clinical stage: T1aN0 Stage 1A       07/16/2015 Surgery    Rt Lumpectomy Dalbert Batman): IDC grade 2, 0.6 cm, with DCIS, 0/4 LN, ER 95%, PR 90% HER-2 negative ratio 1.70, KI 67:15%, Path stage: T1aN0 Stage 1A , Oncotype DX score 19, 12% ROR      09/07/2015 - 10/26/2015 Radiation Therapy    Adjuvant radiation therapy ([place]). Right breast treated to 50.4 Gy in 28 fractions.  Right breast boost treated to 10 Gy in 5 fractions       11/01/2015 -  Anti-estrogen oral therapy    Anastrozole 1 mg daily 5 years       INTERVAL HISTORY:  Brandi Rodgers presents to the [facility] today for our initial meeting to review her survivorship care plan detailing her treatment course for breast cancer, as well as monitoring long-term side effects of that treatment, education regarding health maintenance, screening, and overall wellness and health promotion.     Overall, Brandi Rodgers reports feeling pretty well since completing her radiation therapy approximately 2.5 months ago.  She Started the anastrozole in 10/2015; since that time she has been having very significant and frequent hot flashes, about 10-20 times per day. These hot flashes are severe and leave her in "drenching sweats." She would like to talk to me today about something that may help her with the hot flashes. Otherwise she is tolerating the anastrozole well; she denies any arthralgias or vaginal dryness. She does report having some diarrhea since starting the anastrozole, but this is improving.  She continues to have  some right breast and axilla pain, and wants to know if this is to be expected at this point.   She tells me that she is concerned about how her husband and adult son are coping with her breast cancer diagnosis and treatment. Otherwise, she is largely without complaints today.   REVIEW OF SYSTEMS:  Review of Systems  Constitutional: Negative.   HENT: Negative.   Eyes: Negative.   Respiratory: Negative.   Cardiovascular: Negative.   Gastrointestinal: Positive for diarrhea.  Genitourinary: Negative.   Musculoskeletal: Negative.  Negative for joint pain.  Skin: Negative.   Neurological: Negative.   Endo/Heme/Allergies:       Hot flashes; 10-20x/day with "drenching sweats"  Psychiatric/Behavioral: The patient is nervous/anxious.   GU: Denies vaginal bleeding, discharge, or dryness.  Breast: Denies any new nodularity, masses, nipple changes, or nipple discharge.    A 14-point review of systems was completed and was negative, except as noted above.   ONCOLOGY TREATMENT TEAM:  1. Surgeon:  Dr. Dalbert Batman at [facility] 2. Medical Oncologist: Dr. Lindi Adie 3. Radiation Oncologist: Dr. Sondra Come    PAST MEDICAL/SURGICAL HISTORY:  Past Medical History:  Diagnosis Date  . Arthritis    cervical and lumbar spine  . Breast cancer of lower-outer quadrant of right female breast ([place]) 07/02/2015  . Degenerative lumbar disc   . Dental crowns present   . GERD (gastroesophageal reflux disease)   . Hypercholesterolemia    borderline -  no current med.  . Jaw clicking   . Migraines   . Osteoarthritis    bilateral knee  . Paroxysmal atrial fibrillation ([place])    last episode was 01/2014  . Radiation 09/07/15-10/26/15   right breast 50.4 Gy, boost to 10 Gy  . Sleep apnea    uses mouth guard at night; no CPAP   Past Surgical History:  Procedure Laterality Date  . CARPAL TUNNEL RELEASE Right 05/13/2003  . DILATION AND CURETTAGE OF UTERUS    . KNEE ARTHROSCOPY Right 04/06/2003  . KNEE  ARTHROSCOPY Left 11/30/2003  . RADIOACTIVE SEED GUIDED MASTECTOMY WITH AXILLARY SENTINEL LYMPH NODE BIOPSY Right 07/16/2015   Procedure: RADIOACTIVE SEED GUIDED PARTIAL MASTECTOMY WITH AXILLARY SENTINEL LYMPH NODE BIOPSY;  Surgeon: [name], MD;  Location: [facility];  Service: General;  Laterality: Right;  . TOTAL KNEE ARTHROPLASTY Left 03/23/2014   Procedure: LEFT TOTAL KNEE ARTHROPLASTY ;  Surgeon: [name], MD;  [facility];  Service: Orthopedics;  Laterality: Left;  . TOTAL KNEE ARTHROPLASTY Right 08/21/2014   Procedure: RIGHT TOTAL KNEE ARTHROPLASTY;  Surgeon: Gaynelle Arabian, MD;  [facility];  Service: Orthopedics;  Laterality: Right;     ALLERGIES:  Allergies  Allergen Reactions  . Adhesive [Tape] Other (See Comments)    BLISTERS  . Methocarbamol Hives     CURRENT MEDICATIONS:  Outpatient Encounter Prescriptions as of 02/14/2016  Medication Sig Note  . acetaminophen (TYLENOL) 325 MG tablet  09/14/2015: Received from: [facility])  . albuterol (PROVENTIL HFA) 108 (90 Base) MCG/ACT inhaler Reported on 10/13/2015 09/28/2015: Received from: [facility])  . anastrozole (ARIMIDEX) 1 MG tablet Take 1 tablet (1 mg total) by mouth daily.   [name] apixaban (ELIQUIS) 5 MG TABS tablet TAKE 1 TABLET (5 MG TOTAL) BY MOUTH 2 (TWO) TIMES DAILY.   [name] atenolol (TENORMIN) 50 MG tablet Take 50 mg by mouth daily.    [name] BIOTIN 5000 PO Take 1 tablet by mouth daily.   [name] bismuth subsalicylate (PEPTO BISMOL) 262 MG chewable tablet Chew 524 mg by mouth as needed.   . Calcium-Vitamin D-Vitamin K (VIACTIV PO) Take 1 tablet by mouth daily.   . furosemide (LASIX) 20 MG tablet Take 20 mg by mouth daily as needed for fluid. Reported on 10/13/2015   . gabapentin (NEURONTIN) 600 MG tablet Take 600 mg by mouth 3 (three) times daily.   . hyaluronate sodium (RADIAPLEXRX) GEL Apply 1  application topically 2 (two) times daily. 10/26/2015: Refill given 10/26/15  . Multiple Vitamin (MULTIVITAMIN) tablet Take 1 tablet by mouth daily.   . non-metallic deodorant Jethro Poling) MISC Apply 1 application topically daily as needed.   . ranitidine (ZANTAC) 75 MG tablet Take 75 mg by mouth 2 (two) times daily as needed for heartburn. Reported on 09/14/2015   . rizatriptan (MAXALT-MLT) 10 MG disintegrating tablet Take 10 mg by mouth as needed for migraine. Reported on 09/14/2015   . traMADol (ULTRAM) 50 MG tablet Take by mouth every 6 (six) hours as needed.   . venlafaxine XR (EFFEXOR-XR) 37.5 MG 24 hr capsule Take 1 capsule (37.5 mg total) by mouth daily with breakfast.    No facility-administered encounter medications on file as of 02/14/2016.      ONCOLOGIC FAMILY HISTORY:  Family History  Problem Relation Age of Onset  . Lung cancer Mother   . Thyroid cancer Mother  GENETIC COUNSELING/TESTING: None.  SOCIAL HISTORY:  Brandi Rodgers is married and lives with her husband in [place], [place]. She has 1 adult son, who lives in [place], [place]; he is a Land there.  She denies any current tobacco or illicit drug use.  She drinks alcohol occasionally.   PHYSICAL EXAMINATION:  Vital Signs:   Vitals:   02/14/16 1138  BP: 125/84  Pulse: 72  Resp: 18  Temp: 98.2 F (36.8 C)   Filed Weights   02/14/16 1138  Weight: 285 lb 11.2 oz (129.6 kg)   General: Well-nourished, well-appearing female in no acute distress.  She is unaccompanied today.   HEENT: Head is normocephalic.  Pupils equal and reactive to light. Conjunctivae clear without exudate.  Sclerae anicteric. Oral mucosa is pink, moist.  Oropharynx is pink without lesions or erythema.  Lymph: No cervical, supraclavicular, or infraclavicular lymphadenopathy noted on palpation.  Cardiovascular: Regular rate and rhythm.[name] Respiratory: Clear to auscultation bilaterally. Chest expansion symmetric; breathing non-labored.   GI: Abdomen soft and round; non-tender, non-distended. Bowel sounds normoactive.  GU: Deferred.  Neuro: No focal deficits. Steady gait.  Psych: Mood and affect normal and appropriate for situation.  Extremities: No edema. Skin: Warm and dry.  LABORATORY DATA:  None for this visit.  DIAGNOSTIC IMAGING:  None for this visit.      ASSESSMENT AND PLAN:  Ms.. Rodgers is a pleasant 65 y.o. female with Stage IA right breast invasive ductal carcinoma, ER+/PR+/HER2-, diagnosed in 06/2015; treated with lumpectomy, adjuvant radiation therapy, and anti-estrogen therapy with anastrozole beginning in 10/2015.  She presents to the Survivorship Clinic for our initial meeting and routine follow-up post-completion of treatment for breast cancer.    1. Stage IA right breast cancer:  Brandi Rodgers is continuing to recover from definitive treatment for breast cancer. She will follow-up with her medical oncologist, Dr. Lindi Adie next week (02/21/16) with history and physical exam per surveillance protocol.  She will continue her anti-estrogen therapy with anastrozole. Common side effects of Tamoxifen were again reviewed with her as well. Today, a comprehensive survivorship care plan and treatment summary was reviewed with the patient today detailing her breast cancer diagnosis, treatment course, potential late/long-term effects of treatment, appropriate follow-up care with recommendations for the future, and patient education resources.  A copy of this summary, along with a letter will be sent to the patient's primary care provider via mail/fax/In Basket message after today's visit.    2. Hot flashes secondary to aromatase inhibitor use: Brandi Rodgers is experiencing significant hot flashes since beginning anastrozole. We discussed that there are several options to help in managing hot flashes associated with anti-estrogen therapy. Given that her hot flashes are mostly during the day, with little to no hot flashes at night,  Effexor XR may be a good option for her. We discussed possible side effects of this medication.  I e-prescribed Effexor XR 37.5 mg to her pharmacy today.   She understands that she may require an increased dose of the Effexor to optimize her hot flash symptoms. I encouraged her to talk with Dr. Lindi Adie in the coming months, as he can make recommendations on when may be the most appropriate time to increase the dose of the Effexor further.  3. Right breast and axilla pain: We discussed that mastalgia and axillary pain are normal after lumpectomy and subsequent radiation. She understands that these symptoms will likely continue to improve with time. She was reassured that these are normal findings. They are not  severe and do not warrant additional intervention at this time; we will continue to monitor.  4. Bone health:  Given Brandi Rodgers's age, history of breast cancer, and her current treatment regimen including anti-estrogen therapy with anastrozole she is at risk for bone demineralization.  Her last DEXA scan was on 06/18/15 and was normal. We reviewed that aromatase inhibitors like anastrozole can weaken bones. Therefore, she was encouraged to increase her consumption of foods rich in calcium, as well as increase her weight-bearing activities.  She was given education on specific activities to promote bone health.  5. Cancer screening:  Due to Brandi Rodgers's history and her age, she should receive screening for skin cancers, colon cancer, and gynecologic cancers.  The information and recommendations are listed on the patient's comprehensive care plan/treatment summary and were reviewed in detail with the patient.    6. Health maintenance and wellness promotion: Brandi Rodgers was encouraged to consume 5-7 servings of fruits and vegetables per day. We reviewed the "Nutrition Rainbow."  She was also encouraged to engage in moderate to vigorous exercise for 30 minutes per day most days of the week. We discussed the  [employer] fitness program, which is designed for cancer survivors to help them become more physically fit after cancer treatments.  She was instructed to limit her alcohol consumption and continue to abstain from tobacco use.    7. Support services/counseling: It is not uncommon for this period of the patient's cancer care trajectory to be one of many emotions and stressors.  We discussed an opportunity for her to participate in the next session of [facility] designed for patients after they have completed treatment.  Given her reported concerns about her son and husband, the [facility] ("Finding Your New Normal") support group series would give her some additional resources to talk with her family members about her cancer diagnosis and recovery. She will consider participating and was given a brochure with instructions on how to enroll in the program if she chooses. Brandi Rodgers was encouraged to take advantage of our many other support services programs, support groups, and/or counseling in coping with her new life as a cancer survivor after completing anti-cancer treatment.  She was offered support today through active listening and expressive supportive counseling.  She was given information regarding our other available services and encouraged to contact me with any questions or for help enrolling in any of our support group/programs.    Dispo:   -Return to cancer center to see Dr. Lindi Adie on 02/21/16  -She is welcome to return back to the Survivorship Clinic at any time; no additional follow-up needed at this time.  -Consider referral back to survivorship as a long-term survivor for continued surveillance  A total of 60 minutes of face-to-face time was spent with this patient with greater than 50% of that time in counseling and care-coordination.   Mike Craze, NP Survivorship Program [place] [phone]   Note: PRIMARY CARE PROVIDER [name],  [place] [phone]

## 2016-02-16 ENCOUNTER — Encounter: Payer: Self-pay | Admitting: Adult Health

## 2016-02-21 ENCOUNTER — Encounter: Payer: Self-pay | Admitting: Hematology and Oncology

## 2016-02-21 ENCOUNTER — Ambulatory Visit (HOSPITAL_BASED_OUTPATIENT_CLINIC_OR_DEPARTMENT_OTHER): Payer: Medicare Other | Admitting: Hematology and Oncology

## 2016-02-21 DIAGNOSIS — R232 Flushing: Secondary | ICD-10-CM | POA: Diagnosis not present

## 2016-02-21 DIAGNOSIS — Z170 Estrogen receptor positive status [ER+]: Secondary | ICD-10-CM

## 2016-02-21 DIAGNOSIS — C50511 Malignant neoplasm of lower-outer quadrant of right female breast: Secondary | ICD-10-CM | POA: Diagnosis not present

## 2016-02-21 DIAGNOSIS — Z79811 Long term (current) use of aromatase inhibitors: Secondary | ICD-10-CM | POA: Diagnosis not present

## 2016-02-21 NOTE — Progress Notes (Signed)
Patient Care Team: Jonathon Jordan, MD as PCP - General (Family Medicine) Sylvan Cheese, NP as Nurse Practitioner (Hematology and Oncology)  DIAGNOSIS: Breast cancer of lower-outer quadrant of right female breast [facility])   Staging form: Breast, AJCC 7th Edition   - Clinical stage from 07/07/2015: Stage IA (T1a, N0, M0) - Unsigned  SUMMARY OF ONCOLOGIC HISTORY:   Breast cancer of lower-outer quadrant of right female breast ([place])   06/25/2015 Initial Diagnosis    Right breast screening det focal asymmetry plus calcs 4 mm at 8:00 axilla neg, 3-D biopsy: Grade 1 IDC with DCIS and calcifications, ER 95%, PR 90% HER-2 negative ratio 1.70, KI 67:15%, clinical stage: T1aN0 Stage 1A       07/16/2015 Surgery    Rt Lumpectomy [facility]): IDC grade 2, 0.6 cm, with DCIS, 0/4 LN, ER 95%, PR 90% HER-2 negative ratio 1.70, KI 67:15%, Path stage: T1aN0 Stage 1A , Oncotype DX score 19, 12% ROR      09/07/2015 - 10/26/2015 Radiation Therapy    Adjuvant radiation therapy ([place]). Right breast treated to 50.4 Gy in 28 fractions.  Right breast boost treated to 10 Gy in 5 fractions       11/01/2015 -  Anti-estrogen oral therapy    Anastrozole 1 mg daily 5 years       CHIEF COMPLIANT: follow-up on anastrozole  INTERVAL HISTORY: Brandi Rodgers is a 65 year old with above-mentioned history of right breast cancer treated with lumpectomy radiation is currently on anastrozole. She is tolerating anastrozole fairly well. She reports profound hot flashes 10-20 times per day accompanied by intermittent loose stools. She just started on Effexor few days ago and is awaiting response to treatment.  REVIEW OF SYSTEMS:   Constitutional: Denies fevers, chills or abnormal weight loss, severe hot flashes, obesity Eyes: Denies blurriness of vision Ears, nose, mouth, throat, and face: Denies mucositis or sore throat Respiratory: Denies cough, dyspnea or wheezes Cardiovascular: Denies palpitation, chest  discomfort Gastrointestinal:  Denies nausea, heartburn or change in bowel habits Skin: Denies abnormal skin rashes Lymphatics: Denies new lymphadenopathy or easy bruising Neurological:Denies numbness, tingling or new weaknesses Behavioral/Psych: Mood is stable, no new changes  Extremities: No lower extremity edema  All other systems were reviewed with the patient and are negative.  I have reviewed the past medical history, past surgical history, social history and family history with the patient and they are unchanged from previous note.  ALLERGIES:  is allergic to adhesive [tape] and methocarbamol.  MEDICATIONS:  Current Outpatient Prescriptions  Medication Sig Dispense Refill  . acetaminophen (TYLENOL) 325 MG tablet     . albuterol (PROVENTIL HFA) 108 (90 Base) MCG/ACT inhaler Reported on 10/13/2015    . anastrozole (ARIMIDEX) 1 MG tablet Take 1 tablet (1 mg total) by mouth daily. 90 tablet 3  . apixaban (ELIQUIS) 5 MG TABS tablet TAKE 1 TABLET (5 MG TOTAL) BY MOUTH 2 (TWO) TIMES DAILY. 180 tablet 1  . atenolol (TENORMIN) 50 MG tablet Take 50 mg by mouth daily.     [name] BIOTIN 5000 PO Take 1 tablet by mouth daily.    [name] bismuth subsalicylate (PEPTO BISMOL) 262 MG chewable tablet Chew 524 mg by mouth as needed.    . Calcium-Vitamin D-Vitamin K (VIACTIV PO) Take 1 tablet by mouth daily.    . furosemide (LASIX) 20 MG tablet Take 20 mg by mouth daily as needed for fluid. Reported on 10/13/2015    . gabapentin (NEURONTIN) 600 MG tablet Take 600 mg  by mouth 3 (three) times daily.    . hyaluronate sodium (RADIAPLEXRX) GEL Apply 1 application topically 2 (two) times daily.    . Multiple Vitamin (MULTIVITAMIN) tablet Take 1 tablet by mouth daily.    . non-metallic deodorant Jethro Poling) MISC Apply 1 application topically daily as needed.    . ranitidine (ZANTAC) 75 MG tablet Take 75 mg by mouth 2 (two) times daily as needed for heartburn. Reported on 09/14/2015    . rizatriptan (MAXALT-MLT) 10 MG  disintegrating tablet Take 10 mg by mouth as needed for migraine. Reported on 09/14/2015    . traMADol (ULTRAM) 50 MG tablet Take by mouth every 6 (six) hours as needed.    . venlafaxine XR (EFFEXOR-XR) 37.5 MG 24 hr capsule Take 1 capsule (37.5 mg total) by mouth daily with breakfast. 90 capsule 3   No current facility-administered medications for this visit.     PHYSICAL EXAMINATION: ECOG PERFORMANCE STATUS: 1 - Symptomatic but completely ambulatory  Vitals:   02/21/16 1403  BP: (!) 140/50  Pulse: (!) 59  Resp: 18  Temp: 98 F (36.7 C)   Filed Weights   02/21/16 1403  Weight: 288 lb 14.4 oz (131 kg)    GENERAL:alert, no distress and comfortable SKIN: skin color, texture, turgor are normal, no rashes or significant lesions EYES: normal, Conjunctiva are pink and non-injected, sclera clear OROPHARYNX:no exudate, no erythema and lips, buccal mucosa, and tongue normal  NECK: supple, thyroid normal size, non-tender, without nodularity LYMPH:  no palpable lymphadenopathy in the cervical, axillary or inguinal LUNGS: clear to auscultation and percussion with normal breathing effort HEART: regular rate & rhythm and no murmurs and no lower extremity edema ABDOMEN:abdomen soft, non-tender and normal bowel sounds MUSCULOSKELETAL:no cyanosis of digits and no clubbing  NEURO: alert & oriented x 3 with fluent speech, no focal motor/sensory deficits EXTREMITIES: No lower extremity edema  LABORATORY DATA:  I have reviewed the data as listed   Chemistry      Component Value Date/Time   NA 141 12/27/2015 1524   NA 142 07/07/2015 1226   K 4.1 12/27/2015 1524   K 4.0 07/07/2015 1226   CL 102 12/27/2015 1524   CO2 32 (H) 12/27/2015 1524   CO2 29 07/07/2015 1226   BUN 9 12/27/2015 1524   BUN 10.9 07/07/2015 1226   CREATININE 0.83 12/27/2015 1524   CREATININE 0.8 07/07/2015 1226      Component Value Date/Time   CALCIUM 9.6 12/27/2015 1524   CALCIUM 10.0 07/07/2015 1226   ALKPHOS 127  07/07/2015 1226   AST 22 07/07/2015 1226   ALT 18 07/07/2015 1226   BILITOT 0.52 07/07/2015 1226       Lab Results  Component Value Date   WBC 8.3 12/27/2015   HGB 13.2 12/27/2015   HCT 39.9 12/27/2015   MCV 86.7 12/27/2015   PLT 229 12/27/2015   NEUTROABS 6.6 (H) 07/07/2015     ASSESSMENT & PLAN:  Breast cancer of lower-outer quadrant of right female breast ([place]) Rt Lumpectomy 07/16/15: IDC grade 2, 0.6 cm, with DCIS, 0/4 LN, ER 95%, PR 90% HER-2 negative ratio 1.70, KI 67:15%, Path stage: T1aN0 Stage 1A Oncotype DX score 19, 12% risk of recurrence Status post radiation 09/08/2015 to 10/26/2015  Current treatment: Anastrozole 1 mg daily 5 years started 11/01/2015  Anastrozole toxicities:  1. Worsening hot flashes:patient has had hot flashes prior to starting anastrozole therapy but they have gotten twice as bad. She just started on Effexor this past  Saturday. We are waiting to see the Effexor helps her. If in spite of Effexor she continues to have hot flashes, then we might have to consider switching her treatment to letrozole. Denies any muscle aches or pains.  Bone density done February 2017  Return to clinic in 6 months for follow up.   No orders of the defined types were placed in this encounter.  The patient has a good understanding of the overall plan. she agrees with it. she will call with any problems that may develop before the next visit here.   Rulon Eisenmenger, MD 02/21/16

## 2016-02-21 NOTE — Assessment & Plan Note (Signed)
Rt Lumpectomy 07/16/15: IDC grade 2, 0.6 cm, with DCIS, 0/4 LN, ER 95%, PR 90% HER-2 negative ratio 1.70, KI 67:15%, Path stage: T1aN0 Stage 1A Oncotype DX score 19, 12% risk of recurrence Status post radiation 09/08/2015 to 10/26/2015  Current treatment: Anastrozole 1 mg daily 5 years started 11/01/2015  Anastrozole toxicities:   Return to clinic in 6 months for follow up.

## 2016-04-25 ENCOUNTER — Other Ambulatory Visit: Payer: Self-pay | Admitting: Cardiovascular Disease

## 2016-06-08 ENCOUNTER — Encounter: Payer: Self-pay | Admitting: Radiation Oncology

## 2016-06-08 ENCOUNTER — Ambulatory Visit
Admission: RE | Admit: 2016-06-08 | Discharge: 2016-06-08 | Disposition: A | Discharge status: Home / Self Care | Payer: Medicare Other | Source: Ambulatory Visit | Attending: Radiation Oncology | Admitting: Radiation Oncology

## 2016-06-08 DIAGNOSIS — C50511 Malignant neoplasm of lower-outer quadrant of right female breast: Secondary | ICD-10-CM

## 2016-06-08 DIAGNOSIS — Y842 Radiological procedure and radiotherapy as the cause of abnormal reaction of the patient, or of later complication, without mention of misadventure at the time of the procedure: Secondary | ICD-10-CM | POA: Insufficient documentation

## 2016-06-08 DIAGNOSIS — C50911 Malignant neoplasm of unspecified site of right female breast: Secondary | ICD-10-CM | POA: Diagnosis not present

## 2016-06-08 DIAGNOSIS — Z79899 Other long term (current) drug therapy: Secondary | ICD-10-CM | POA: Insufficient documentation

## 2016-06-08 DIAGNOSIS — Z170 Estrogen receptor positive status [ER+]: Secondary | ICD-10-CM | POA: Diagnosis present

## 2016-06-08 NOTE — Progress Notes (Signed)
Radiation Oncology         [phone] ________________________________  Name: Brandi Rodgers MRN: [id number]  Date: 06/08/2016  DOB: 1951-02-18      Follow-Up Visit Note  CC: Brandi Coma, MD  Brandi Skates, MD   Diagnosis: Stage IA (pT1bN0) Invasive Ductal Carcinoma of the Right Breast (ER/PR positive, HER2 negative)     Interval Since Last Radiation: 7 months  09/07/15- 10/26/15: 1) Right breast treated to 50.4 Gy in 28 fractions 2) Right breast boost treated to 10 Gy in 5 fractions  Narrative:  The patient returns today for routine follow-up. The patient has been on Anastrozole 1 mg daily since June 2017 per Dr. Lindi Adie. The patient is taking Effexor for hot flashes, but they are still intense. She states the hot flashes mostly occur when she gets out of bed. They sometimes wake her up. Dr. Lindi Adie discussed with the patient that if the hot flashes effects her quality of of life, then he may switch her to Anastrozole to Letrozole. She denies pain at this time. She reports occasional pain in her right arm that she attributes to lifting heavy Christmas items. She denies having fatigue. She reports having some itching in the location of her lymph node incision.  The nurse notes the skin on her right breast has slight hyperpigmentation.  ALLERGIES:  is allergic to adhesive [tape] and methocarbamol.  Meds: Current Outpatient Prescriptions  Medication Sig Dispense Refill  . acetaminophen (TYLENOL) 325 MG tablet     . albuterol (PROVENTIL HFA) 108 (90 Base) MCG/ACT inhaler Reported on 10/13/2015    . amoxicillin (AMOXIL) 500 MG tablet TAKE 1 TABLET BY MOUTH EVERY 6 HOURS UNTIL FINISHED  0  . anastrozole (ARIMIDEX) 1 MG tablet Take 1 tablet (1 mg total) by mouth daily. 90 tablet 3  . atenolol (TENORMIN) 50 MG tablet Take 50 mg by mouth daily.     [name] bismuth subsalicylate (PEPTO BISMOL) 262 MG chewable tablet Chew 524 mg by mouth as needed.    . Calcium-Vitamin D-Vitamin K (VIACTIV  PO) Take 1 tablet by mouth daily.    [name] ELIQUIS 5 MG TABS tablet TAKE 1 TABLET (5 MG TOTAL) BY MOUTH 2 (TWO) TIMES DAILY. 180 tablet 1  . gabapentin (NEURONTIN) 600 MG tablet Take 600 mg by mouth 3 (three) times daily.    . Multiple Vitamin (MULTIVITAMIN) tablet Take 1 tablet by mouth daily.    . ranitidine (ZANTAC) 75 MG tablet Take 75 mg by mouth 2 (two) times daily as needed for heartburn. Reported on 09/14/2015    . rizatriptan (MAXALT-MLT) 10 MG disintegrating tablet Take 10 mg by mouth as needed for migraine. Reported on 09/14/2015    . venlafaxine XR (EFFEXOR-XR) 37.5 MG 24 hr capsule Take 1 capsule (37.5 mg total) by mouth daily with breakfast. 90 capsule 3  . BIOTIN 5000 PO Take 1 tablet by mouth daily.    . furosemide (LASIX) 20 MG tablet Take 20 mg by mouth daily as needed for fluid. Reported on 10/13/2015    . traMADol (ULTRAM) 50 MG tablet Take by mouth every 6 (six) hours as needed.     No current facility-administered medications for this encounter.     Physical Findings: The patient is in no acute distress. Patient is alert and oriented.  height is _0  (1.727 m) and weight is 282 lb 3.2 oz (128 kg). Her oral temperature is 98.3 F (36.8 C). Her blood pressure is 136/86 and her pulse is  61. Her oxygen saturation is 97%. .   Lungs are clear to auscultation bilaterally. Heart has regular rate and rhythm. No palpable cervical, supraclavicular, or axillary adenopathy. Mild swelling in the nipple areolar complex of the right breast. Slight hyperpigmentation of the right breast. No nipple discharge, bleeding, or appreciable masses in either breast.  Lab Findings: Lab Results  Component Value Date   WBC 8.3 12/27/2015   HGB 13.2 12/27/2015   HCT 39.9 12/27/2015   MCV 86.7 12/27/2015   PLT 229 12/27/2015    Radiographic Findings: No results found.  Impression:  The patient is recovering from the effects of radiation.  No evidence of recurrence on clinical exam today.  Plan: PRN  follow up with radiation oncology. The patient will continue close follow up with Dr. Lindi Adie of medical oncology. Her next appointment with him is in April 2018. ____________________________________ -----------------------------------  Blair Promise, PhD, MD  This document serves as a record of services personally performed by Gery Pray, MD. It was created on his behalf by Darcus Austin, a trained medical scribe. The creation of this record is based on the scribe's personal observations and the provider's statements to them. This document has been checked and approved by the attending provider.

## 2016-06-08 NOTE — Progress Notes (Signed)
Brandi Rodgers is here for follow up.  She denies having any pain.  She is taking Arimidex.  She reports she is taking Effexor but is still having intense hot flashes.  She denies having fatigue.  She reports having some itching in her lymph node incision.  The skin on her right breast has slight hyperpigmentation.  BP 136/86 (BP Location: Left Arm, Patient Position: Sitting)   Pulse 61   Temp 98.3 F (36.8 C) (Oral)   Ht 5\' 8"  (1.727 m)   Wt 282 lb 3.2 oz (128 kg)   SpO2 97%   BMI 42.91 kg/m    Wt Readings from Last 3 Encounters:  06/08/16 282 lb 3.2 oz (128 kg)  02/21/16 288 lb 14.4 oz (131 kg)  02/14/16 285 lb 11.2 oz (129.6 kg)

## 2016-06-26 ENCOUNTER — Ambulatory Visit (INDEPENDENT_AMBULATORY_CARE_PROVIDER_SITE_OTHER): Payer: Medicare Other | Admitting: Cardiovascular Disease

## 2016-06-26 ENCOUNTER — Encounter: Payer: Self-pay | Admitting: Cardiovascular Disease

## 2016-06-26 VITALS — BP 122/90 | HR 71 | Ht 68.0 in | Wt 283.0 lb

## 2016-06-26 DIAGNOSIS — G43C0 Periodic headache syndromes in child or adult, not intractable: Secondary | ICD-10-CM

## 2016-06-26 DIAGNOSIS — I480 Paroxysmal atrial fibrillation: Secondary | ICD-10-CM | POA: Diagnosis not present

## 2016-06-26 DIAGNOSIS — I2721 Secondary pulmonary arterial hypertension: Secondary | ICD-10-CM

## 2016-06-26 DIAGNOSIS — I10 Essential (primary) hypertension: Secondary | ICD-10-CM

## 2016-06-26 NOTE — Progress Notes (Signed)
Cardiology Office Note    Date:  06/27/2016   ID:  Brandi Rodgers, DOB 1950-09-26, MRN [id number]  PCP:  [name], MD  Cardiologist:   Sanda Klein, MD   Chief complaint: Establish new cardiology follow-up.   History of Present Illness:  Brandi Rodgers is a 66 y.o. female with paroxysmal atrial fibrillation, migraine headaches for which she takes atenolol , on anticoagulation with Eliquis without bleeding complications, morbid obesity, sleep apnea (uses a jaw advancement device).  Since her last appointment she continues to have episodes of atrial fibrillation roughly once or twice a month. Usually last less than 2 hours. Most of them are quite mild but twice in the last year she has had severe symptoms. She describes the rhythm is being fast and regular. It appears that she consider the episodes of atrial fibrillation to be severe when they associate migraines. Interestingly the palpitations preceded the migraines on both occasions and all the symptoms improved when she took rizatriptan.  Chest not had any symptoms to suggest stroke or TIA. She denies exertional angina or dyspnea. She has not had leg edema, claudication, syncope or dizziness.   Echocardiogram performed in October 2015 showed normal left ventricular wall thickness and ejection fraction, normal left atrial size, but did show mild pulmonary hypertension with an estimated systolic PA pressure 41 mmHg. Has a history of right breast cancer status post surgery and right breast radiation therapy. She does not have angina pectoris or any known coronary or peripheral vascular obstructive lesions. She has mild hyperlipidemia which has been managed with diet alone. She has had problems with edema in the past but uses furosemide extremely rarely.   Past Medical History:  Diagnosis Date  . Arthritis    cervical and lumbar spine  . Breast cancer of lower-outer quadrant of right female breast ([place]) 07/02/2015  .  Degenerative lumbar disc   . Dental crowns present   . GERD (gastroesophageal reflux disease)   . Hypercholesterolemia    borderline - no current med.  . Jaw clicking   . Migraines   . Osteoarthritis    bilateral knee  . Paroxysmal atrial fibrillation ([place])    last episode was 01/2014  . Radiation 09/07/15-10/26/15   right breast 50.4 Gy, boost to 10 Gy  . Sleep apnea    uses mouth guard at night; no CPAP    Past Surgical History:  Procedure Laterality Date  . CARPAL TUNNEL RELEASE Right 05/13/2003  . DILATION AND CURETTAGE OF UTERUS    . KNEE ARTHROSCOPY Right 04/06/2003  . KNEE ARTHROSCOPY Left 11/30/2003  . RADIOACTIVE SEED GUIDED MASTECTOMY WITH AXILLARY SENTINEL LYMPH NODE BIOPSY Right 07/16/2015   Procedure: RADIOACTIVE SEED GUIDED PARTIAL MASTECTOMY WITH AXILLARY SENTINEL LYMPH NODE BIOPSY;  Surgeon: [name], MD;  Location: [facility];  Service: General;  Laterality: Right;  . TOTAL KNEE ARTHROPLASTY Left 03/23/2014   Procedure: LEFT TOTAL KNEE ARTHROPLASTY ;  Surgeon: [name], MD;  Location: [facility];  Service: Orthopedics;  Laterality: Left;  . TOTAL KNEE ARTHROPLASTY Right 08/21/2014   Procedure: RIGHT TOTAL KNEE ARTHROPLASTY;  Surgeon: Gaynelle Arabian, MD;  Location: [facility];  Service: Orthopedics;  Laterality: Right;    Current Medications: Outpatient Medications Prior to Visit  Medication Sig Dispense Refill  . acetaminophen (TYLENOL) 325 MG tablet     . albuterol (PROVENTIL HFA) 108 (90 Base) MCG/ACT inhaler Reported on 10/13/2015    . anastrozole (ARIMIDEX) 1 MG tablet Take 1 tablet (  1 mg total) by mouth daily. 90 tablet 3  . atenolol (TENORMIN) 50 MG tablet Take 50 mg by mouth daily.     [name] BIOTIN 5000 PO Take 1 tablet by mouth daily.    [name] bismuth subsalicylate (PEPTO BISMOL) 262 MG chewable tablet Chew 524 mg by mouth as needed.    . Calcium-Vitamin D-Vitamin K (VIACTIV PO) Take 1 tablet by mouth daily.    [name] ELIQUIS 5 MG TABS tablet TAKE 1  TABLET (5 MG TOTAL) BY MOUTH 2 (TWO) TIMES DAILY. 180 tablet 1  . furosemide (LASIX) 20 MG tablet Take 20 mg by mouth daily as needed for fluid. Reported on 10/13/2015    . gabapentin (NEURONTIN) 600 MG tablet Take 600 mg by mouth 3 (three) times daily.    . Multiple Vitamin (MULTIVITAMIN) tablet Take 1 tablet by mouth daily.    . ranitidine (ZANTAC) 75 MG tablet Take 75 mg by mouth 2 (two) times daily as needed for heartburn. Reported on 09/14/2015    . rizatriptan (MAXALT-MLT) 10 MG disintegrating tablet Take 10 mg by mouth as needed for migraine. Reported on 09/14/2015    . traMADol (ULTRAM) 50 MG tablet Take by mouth every 6 (six) hours as needed.    . venlafaxine XR (EFFEXOR-XR) 37.5 MG 24 hr capsule Take 1 capsule (37.5 mg total) by mouth daily with breakfast. 90 capsule 3  . amoxicillin (AMOXIL) 500 MG tablet TAKE 1 TABLET BY MOUTH EVERY 6 HOURS UNTIL FINISHED  0   No facility-administered medications prior to visit.      Allergies:   Adhesive [tape] and Methocarbamol   Social History   Social History  . Marital status: Married    Spouse name: N/A  . Number of children: N/A  . Years of education: N/A   Social History Main Topics  . Smoking status: Never Smoker  . Smokeless tobacco: Never Used  . Alcohol use Yes     Comment: occasionally  . Drug use: No  . Sexual activity: Yes   Other Topics Concern  . None   Social History Narrative  . None     Family History:  The patient's family history includes Lung cancer in her mother; Thyroid cancer in her mother.   ROS:   Please see the history of present illness.    ROS All other systems reviewed and are negative.   PHYSICAL EXAM:   VS:  BP 122/90 (BP Location: Left Arm, Patient Position: Sitting, Cuff Size: Large)   Pulse 71   Ht 5\' 8"  (1.727 m)   Wt 128.4 kg (283 lb)   BMI 43.03 kg/m    GEN: Morbidly obese, well developed, in no acute distress  HEENT: normal  Neck: no JVD, carotid bruits, or masses Cardiac: RRR; no  murmurs, rubs, or gallops,no edema  Respiratory:  clear to auscultation bilaterally, normal work of breathing GI: soft, nontender, nondistended, + BS MS: no deformity or atrophy  Skin: warm and dry, no rash Neuro:  Alert and Oriented x 3, Strength and sensation are intact Psych: euthymic mood, full affect  Wt Readings from Last 3 Encounters:  06/26/16 128.4 kg (283 lb)  06/08/16 128 kg (282 lb 3.2 oz)  02/21/16 131 kg (288 lb 14.4 oz)      Studies/Labs Reviewed:   EKG:  EKG is ordered today.  The ekg ordered today demonstrates Normal sinus rhythm, poor R-wave progression, QTC 397 ms  Recent Labs: 07/07/2015: ALT 18 12/27/2015: BUN 9; Creat 0.83; Hemoglobin  13.2; Platelets 229; Potassium 4.1; Sodium 141     ASSESSMENT:    1. Paroxysmal atrial fibrillation (HCC)   2. Essential hypertension   3. Morbid obesity ([place])   4. Periodic headache syndrome, not intractable   5. Pulmonary arterial hypertension      PLAN:  In order of problems listed above:  1. AFib: The burden of arrhythmia is relatively low and she is only minimally symptomatic. Higher doses of beta blocker were not well tolerated in the past. True antiarrhythmics do not appear justified at this point. Ablation can be considered, but her weight might be a problem. 2. Eliquis: CHADSVasc 2 : age, gender. Brief interruptions of anticoagulation for surgical procedures should be quite safe.  3. Morbid obesity: Direct correlation between weight and overall burden of atrial fibrillation is again discussed. Continued efforts at weight loss are justified. 4. Migraines: For the most part, controlled on beta blocker and PRN triptan. 5. PAH: Mild by previous echo. No edema at this time. Might be related to obesity and sleep apnea. Pharmacological therapy is not indicated. Efforts at weight loss would be beneficial.    Medication Adjustments/Labs and Tests Ordered: Current medicines are reviewed at length with the patient today.   Concerns regarding medicines are outlined above.  Medication changes, Labs and Tests ordered today are listed in the Patient Instructions below. Patient Instructions  Dr Sallyanne Kuster recommends that you schedule a follow-up appointment in 1 year. You will receive a reminder letter in the mail two months in advance. If you don't receive a letter, please call our office to schedule the follow-up appointment.  If you need a refill on your cardiac medications before your next appointment, please call your pharmacy.    Signed, Sanda Klein, MD  06/27/2016 2:24 PM    [address], [place], [place]  [address] Phone: [phone]; Fax: [phone]

## 2016-06-26 NOTE — Patient Instructions (Signed)
Dr Croitoru recommends that you schedule a follow-up appointment in 1 year. You will receive a reminder letter in the mail two months in advance. If you don't receive a letter, please call our office to schedule the follow-up appointment.  If you need a refill on your cardiac medications before your next appointment, please call your pharmacy. 

## 2016-07-18 ENCOUNTER — Telehealth: Payer: Self-pay | Admitting: Hematology and Oncology

## 2016-07-18 NOTE — Telephone Encounter (Signed)
Called to r/s MD appt from 4/9 to 4/16. Patient aware of new date and time

## 2016-07-19 ENCOUNTER — Telehealth: Payer: Self-pay | Admitting: Emergency Medicine

## 2016-07-19 NOTE — Telephone Encounter (Signed)
Patient called and left voice mail with complaints of severe hot flashes. Patient states she is not getting any relief from the Effexor. Patient states she is having 20 or more "severe hot flashes" a day.   Per Dr Lindi Adie;  Have patient stop the anastrozole  Have the patient take the effexor every other day for 2 weeks then stop after the 2 weeks  When patient see's Dr Lindi Adie on April 16 he will assess and decide on further plan.  Left message for patient to call this office so she can receive above plan as directed per Dr Lindi Adie.

## 2016-07-21 NOTE — Telephone Encounter (Signed)
S/w pt about Dr Geralyn Flash instructions. She spoke them back.

## 2016-08-21 ENCOUNTER — Ambulatory Visit: Payer: Medicare Other | Admitting: Hematology and Oncology

## 2016-08-28 ENCOUNTER — Encounter: Payer: Self-pay | Admitting: Hematology and Oncology

## 2016-08-28 ENCOUNTER — Ambulatory Visit (HOSPITAL_BASED_OUTPATIENT_CLINIC_OR_DEPARTMENT_OTHER): Payer: Medicare Other | Admitting: Hematology and Oncology

## 2016-08-28 DIAGNOSIS — Z170 Estrogen receptor positive status [ER+]: Secondary | ICD-10-CM

## 2016-08-28 DIAGNOSIS — N951 Menopausal and female climacteric states: Secondary | ICD-10-CM

## 2016-08-28 DIAGNOSIS — C50511 Malignant neoplasm of lower-outer quadrant of right female breast: Secondary | ICD-10-CM

## 2016-08-28 DIAGNOSIS — Z79811 Long term (current) use of aromatase inhibitors: Secondary | ICD-10-CM

## 2016-08-28 MED ORDER — LETROZOLE 2.5 MG PO TABS: 2.5000 mg | ORAL_TABLET | Freq: Every day | ORAL | 0 refills | Status: DC

## 2016-08-28 NOTE — Assessment & Plan Note (Signed)
Rt Lumpectomy 07/16/15: IDC grade 2, 0.6 cm, with DCIS, 0/4 LN, ER 95%, PR 90% HER-2 negative ratio 1.70, KI 67:15%, Path stage: T1aN0 Stage 1A Oncotype DX score 19, 12% risk of recurrence Status post radiation 09/08/2015 to 10/26/2015  Current treatment: Anastrozole 1 mg daily 5 years started 11/01/2015  Anastrozole toxicities:  1. Worsening hot flashes:patient has had hot flashes prior to starting anastrozole therapy but they have gotten twice as bad. Started Effexor September 2017. If in spite of Effexor she continues to have hot flashes, then we might have to consider switching her treatment to letrozole. Denies any muscle aches or pains.  Bone density done February 2017  Return to clinic in 6 months for follow up and then annually.

## 2016-08-28 NOTE — Progress Notes (Signed)
Patient Care Team: Jonathon Jordan, MD as PCP - General (Family Medicine) Sylvan Cheese, NP as Nurse Practitioner (Hematology and Oncology)  DIAGNOSIS:  Encounter Diagnosis  Name Primary?  . Malignant neoplasm of lower-outer quadrant of right breast of female, estrogen receptor positive ([place])     SUMMARY OF ONCOLOGIC HISTORY:   Breast cancer of lower-outer quadrant of right female breast ([place])   06/25/2015 Initial Diagnosis    Right breast screening det focal asymmetry plus calcs 4 mm at 8:00 axilla neg, 3-D biopsy: Grade 1 IDC with DCIS and calcifications, ER 95%, PR 90% HER-2 negative ratio 1.70, KI 67:15%, clinical stage: T1aN0 Stage 1A       07/16/2015 Surgery    Rt Lumpectomy [facility]): IDC grade 2, 0.6 cm, with DCIS, 0/4 LN, ER 95%, PR 90% HER-2 negative ratio 1.70, KI 67:15%, Path stage: T1aN0 Stage 1A , Oncotype DX score 19, 12% ROR      09/07/2015 - 10/26/2015 Radiation Therapy    Adjuvant radiation therapy ([facility]). Right breast treated to 50.4 Gy in 28 fractions.  Right breast boost treated to 10 Gy in 5 fractions       11/01/2015 -  Anti-estrogen oral therapy    Anastrozole 1 mg daily stopped March 2018 due to severe hot flashes switched to letrozole 08/28/2016      CHIEF COMPLIANT: Patient is here to discuss a treatment plan  INTERVAL HISTORY: Brandi Rodgers is a 65 year old with above-mentioned history of right breast cancer treated with lumpectomy and radiation. She went on anastrozole 1 mg daily but it was stopped because of severe hot flashes. This was in spite of using Effexor up to 75 mg dosage. We had to stop anastrozole and slowly taper her off Effexor. She is here today to tell me that the hot flashes continue to be a problem but not as severe as they were before. These hot flashes accompanied by profound sweating all the ports and makes it very uncomfortable. She wants to take antiestrogen therapy and is willing to try another medication.  REVIEW OF  SYSTEMS:   Constitutional: Denies fevers, chills or abnormal weight loss Eyes: Denies blurriness of vision Ears, nose, mouth, throat, and face: Denies mucositis or sore throat Respiratory: Denies cough, dyspnea or wheezes Cardiovascular: Denies palpitation, chest discomfort Gastrointestinal:  Denies nausea, heartburn or change in bowel habits Skin: Denies abnormal skin rashes Lymphatics: Denies new lymphadenopathy or easy bruising Neurological:Denies numbness, tingling or new weaknesses Behavioral/Psych: Mood is stable, no new changes  Extremities: No lower extremity edema All other systems were reviewed with the patient and are negative.  I have reviewed the past medical history, past surgical history, social history and family history with the patient and they are unchanged from previous note.  ALLERGIES:  is allergic to adhesive [tape] and methocarbamol.  MEDICATIONS:  Current Outpatient Prescriptions  Medication Sig Dispense Refill  . acetaminophen (TYLENOL) 325 MG tablet     . albuterol (PROVENTIL HFA) 108 (90 Base) MCG/ACT inhaler Reported on 10/13/2015    . atenolol (TENORMIN) 50 MG tablet Take 50 mg by mouth daily.     [name] BIOTIN 5000 PO Take 1 tablet by mouth daily.    [name] bismuth subsalicylate (PEPTO BISMOL) 262 MG chewable tablet Chew 524 mg by mouth as needed.    . Calcium-Vitamin D-Vitamin K (VIACTIV PO) Take 1 tablet by mouth daily.    [name] ELIQUIS 5 MG TABS tablet TAKE 1 TABLET (5 MG TOTAL) BY MOUTH 2 (TWO) TIMES  DAILY. 180 tablet 1  . furosemide (LASIX) 20 MG tablet Take 20 mg by mouth daily as needed for fluid. Reported on 10/13/2015    . gabapentin (NEURONTIN) 600 MG tablet Take 600 mg by mouth 3 (three) times daily.    [name] letrozole (FEMARA) 2.5 MG tablet Take 1 tablet (2.5 mg total) by mouth daily. 30 tablet 0  . Multiple Vitamin (MULTIVITAMIN) tablet Take 1 tablet by mouth daily.    . ranitidine (ZANTAC) 75 MG tablet Take 75 mg by mouth 2 (two) times daily as needed for  heartburn. Reported on 09/14/2015    . rizatriptan (MAXALT-MLT) 10 MG disintegrating tablet Take 10 mg by mouth as needed for migraine. Reported on 09/14/2015    . traMADol (ULTRAM) 50 MG tablet Take by mouth every 6 (six) hours as needed.     No current facility-administered medications for this visit.     PHYSICAL EXAMINATION: ECOG PERFORMANCE STATUS: 1 - Symptomatic but completely ambulatory  Vitals:   08/28/16 1517  BP: 131/60  Pulse: 64  Resp: 18  Temp: 98.6 F (37 C)   Filed Weights   08/28/16 1517  Weight: 282 lb 12.8 oz (128.3 kg)    GENERAL:alert, no distress and comfortable SKIN: skin color, texture, turgor are normal, no rashes or significant lesions EYES: normal, Conjunctiva are pink and non-injected, sclera clear OROPHARYNX:no exudate, no erythema and lips, buccal mucosa, and tongue normal  NECK: supple, thyroid normal size, non-tender, without nodularity LYMPH:  no palpable lymphadenopathy in the cervical, axillary or inguinal LUNGS: clear to auscultation and percussion with normal breathing effort HEART: regular rate & rhythm and no murmurs and no lower extremity edema ABDOMEN:abdomen soft, non-tender and normal bowel sounds MUSCULOSKELETAL:no cyanosis of digits and no clubbing  NEURO: alert & oriented x 3 with fluent speech, no focal motor/sensory deficits EXTREMITIES: No lower extremity edema  LABORATORY DATA:  I have reviewed the data as listed   Chemistry      Component Value Date/Time   NA 141 12/27/2015 1524   NA 142 07/07/2015 1226   K 4.1 12/27/2015 1524   K 4.0 07/07/2015 1226   CL 102 12/27/2015 1524   CO2 32 (H) 12/27/2015 1524   CO2 29 07/07/2015 1226   BUN 9 12/27/2015 1524   BUN 10.9 07/07/2015 1226   CREATININE 0.83 12/27/2015 1524   CREATININE 0.8 07/07/2015 1226      Component Value Date/Time   CALCIUM 9.6 12/27/2015 1524   CALCIUM 10.0 07/07/2015 1226   ALKPHOS 127 07/07/2015 1226   AST 22 07/07/2015 1226   ALT 18 07/07/2015 1226     BILITOT 0.52 07/07/2015 1226       Lab Results  Component Value Date   WBC 8.3 12/27/2015   HGB 13.2 12/27/2015   HCT 39.9 12/27/2015   MCV 86.7 12/27/2015   PLT 229 12/27/2015   NEUTROABS 6.6 (H) 07/07/2015    ASSESSMENT & PLAN:  Breast cancer of lower-outer quadrant of right female breast ([place]) Rt Lumpectomy 07/16/15: IDC grade 2, 0.6 cm, with DCIS, 0/4 LN, ER 95%, PR 90% HER-2 negative ratio 1.70, KI 67:15%, Path stage: T1aN0 Stage 1A Oncotype DX score 19, 12% risk of recurrence Status post radiation 09/08/2015 to 10/26/2015  Current treatment: Anastrozole 1 mg daily 5 years started 11/01/2015 stopped March 2018, switched to letrozole 08/28/2016 to start at half a tablet daily and to slowly increase it to twice a day if she tolerates it I sent her a prescription for  only 30 tablets of letrozole. If she tolerates it well she will call for more medication. I instructed her that she needs to inform us how much more hot flashes that she experiences over her baseline.  Bone density done February 2017  Return to clinic in 3 months for follow-up.   I spent 25 minutes talking to the patient of which more than half was spent in counseling and coordination of care.  No orders of the defined types were placed in this encounter.  The patient has a good understanding of the overall plan. she agrees with it. she will call with any problems that may develop before the next visit here.   Rulon Eisenmenger, MD 08/28/16

## 2016-09-24 ENCOUNTER — Other Ambulatory Visit: Payer: Self-pay | Admitting: Hematology and Oncology

## 2016-09-27 ENCOUNTER — Encounter: Payer: Self-pay | Admitting: Gynecology

## 2016-09-28 ENCOUNTER — Other Ambulatory Visit: Payer: Self-pay | Admitting: Emergency Medicine

## 2016-09-28 MED ORDER — LETROZOLE 2.5 MG PO TABS: 2.5000 mg | ORAL_TABLET | Freq: Every day | ORAL | 11 refills | Status: DC

## 2016-09-28 NOTE — Telephone Encounter (Signed)
Spoke with patient in regards to her tolerance of the Letrozole. Patient states she is still having some hot flashes but they are not as severe as before. States she does have "a lot of them a day". Patient denies any severe joint pain. States she is taking turmeric as recommended by Dr Lindi Adie and that seems to be helping. Advised patient that I will refill the Letrozole and for her to call with any concerns and to follow up as scheduled. Patient verbalized understanding.

## 2016-10-29 ENCOUNTER — Other Ambulatory Visit: Payer: Self-pay | Admitting: Cardiovascular Disease

## 2016-11-27 ENCOUNTER — Encounter: Payer: Self-pay | Admitting: Hematology and Oncology

## 2016-11-27 ENCOUNTER — Ambulatory Visit (HOSPITAL_BASED_OUTPATIENT_CLINIC_OR_DEPARTMENT_OTHER): Payer: Medicare Other | Admitting: Hematology and Oncology

## 2016-11-27 DIAGNOSIS — Z79811 Long term (current) use of aromatase inhibitors: Secondary | ICD-10-CM

## 2016-11-27 DIAGNOSIS — C50511 Malignant neoplasm of lower-outer quadrant of right female breast: Secondary | ICD-10-CM | POA: Diagnosis not present

## 2016-11-27 DIAGNOSIS — N951 Menopausal and female climacteric states: Secondary | ICD-10-CM

## 2016-11-27 DIAGNOSIS — Z170 Estrogen receptor positive status [ER+]: Secondary | ICD-10-CM

## 2016-11-27 MED ORDER — LETROZOLE 2.5 MG PO TABS: 2.5000 mg | ORAL_TABLET | Freq: Every day | ORAL | 3 refills | Status: DC

## 2016-11-27 MED ORDER — TURMERIC 500 MG PO CAPS: 1.0000 | ORAL_CAPSULE | Freq: Every day | ORAL | Status: DC

## 2016-11-27 NOTE — Progress Notes (Signed)
Patient Care Team: Jonathon Jordan, MD as PCP - General (Family Medicine) Sylvan Cheese, NP as Nurse Practitioner (Hematology and Oncology)  DIAGNOSIS:  Encounter Diagnosis  Name Primary?  . Malignant neoplasm of lower-outer quadrant of right breast of female, estrogen receptor positive ([place])     SUMMARY OF ONCOLOGIC HISTORY:   Breast cancer of lower-outer quadrant of right female breast ([place])   06/25/2015 Initial Diagnosis    Right breast screening det focal asymmetry plus calcs 4 mm at 8:00 axilla neg, 3-D biopsy: Grade 1 IDC with DCIS and calcifications, ER 95%, PR 90% HER-2 negative ratio 1.70, KI 67:15%, clinical stage: T1aN0 Stage 1A       07/16/2015 Surgery    Rt Lumpectomy [facility]): IDC grade 2, 0.6 cm, with DCIS, 0/4 LN, ER 95%, PR 90% HER-2 negative ratio 1.70, KI 67:15%, Path stage: T1aN0 Stage 1A , Oncotype DX score 19, 12% ROR      09/07/2015 - 10/26/2015 Radiation Therapy    Adjuvant radiation therapy ([facility]). Right breast treated to 50.4 Gy in 28 fractions.  Right breast boost treated to 10 Gy in 5 fractions       11/01/2015 -  Anti-estrogen oral therapy    Anastrozole 1 mg daily stopped March 2018 due to severe hot flashes switched to letrozole 08/28/2016       CHIEF COMPLIANT: Follow-up on letrozole therapy  INTERVAL HISTORY: Brandi Rodgers is a 66 year old with above-mentioned history of right breast cancer underwent lumpectomy radiation and is currently on antiestrogen therapy. She could not tolerate anastrozole was switched her to letrozole. On letrozole it appears that the muscle aches and pains have improved and the hot flashes are also better but she still gets about 10-15 per day. This is altering her but she is able to put up with it. She currently takes Neurontin for migraine prevention and it does not seem to be preventing the hot flashes at all. Previously his Effexor which was not very effective and hot flash prevention.   REVIEW OF  SYSTEMS:   Constitutional: Denies fevers, chills or abnormal weight loss Eyes: Denies blurriness of vision Ears, nose, mouth, throat, and face: Denies mucositis or sore throat Respiratory: Denies cough, dyspnea or wheezes Cardiovascular: Denies palpitation, chest discomfort Gastrointestinal:  Denies nausea, heartburn or change in bowel habits Skin: Denies abnormal skin rashes Lymphatics: Denies new lymphadenopathy or easy bruising Neurological:Denies numbness, tingling or new weaknesses Behavioral/Psych: Mood is stable, no new changes  Extremities: No lower extremity edema Breast:  denies any pain or lumps or nodules in either breasts All other systems were reviewed with the patient and are negative.  I have reviewed the past medical history, past surgical history, social history and family history with the patient and they are unchanged from previous note.  ALLERGIES:  is allergic to adhesive [tape] and methocarbamol.  MEDICATIONS:  Current Outpatient Prescriptions  Medication Sig Dispense Refill  . acetaminophen (TYLENOL) 325 MG tablet     . albuterol (PROVENTIL HFA) 108 (90 Base) MCG/ACT inhaler Reported on 10/13/2015    . atenolol (TENORMIN) 50 MG tablet Take 50 mg by mouth daily.     [name] BIOTIN 5000 PO Take 1 tablet by mouth daily.    [name] bismuth subsalicylate (PEPTO BISMOL) 262 MG chewable tablet Chew 524 mg by mouth as needed.    . Calcium-Vitamin D-Vitamin K (VIACTIV PO) Take 1 tablet by mouth daily.    [name] ELIQUIS 5 MG TABS tablet TAKE 1 TABLET (5 MG TOTAL)  BY MOUTH 2 (TWO) TIMES DAILY. 180 tablet 0  . furosemide (LASIX) 20 MG tablet Take 20 mg by mouth daily as needed for fluid. Reported on 10/13/2015    . gabapentin (NEURONTIN) 600 MG tablet Take 600 mg by mouth 3 (three) times daily.    [name] letrozole (FEMARA) 2.5 MG tablet Take 1 tablet (2.5 mg total) by mouth daily. 30 tablet 11  . Multiple Vitamin (MULTIVITAMIN) tablet Take 1 tablet by mouth daily.    . ranitidine (ZANTAC) 75 MG  tablet Take 75 mg by mouth 2 (two) times daily as needed for heartburn. Reported on 09/14/2015    . rizatriptan (MAXALT-MLT) 10 MG disintegrating tablet Take 10 mg by mouth as needed for migraine. Reported on 09/14/2015    . traMADol (ULTRAM) 50 MG tablet Take by mouth every 6 (six) hours as needed.     No current facility-administered medications for this visit.     PHYSICAL EXAMINATION: ECOG PERFORMANCE STATUS: 1 - Symptomatic but completely ambulatory  Vitals:   11/27/16 1049  BP: 124/69  Pulse: 72  Resp: 17  Temp: 98.2 F (36.8 C)   Filed Weights   11/27/16 1049  Weight: 265 lb 4.8 oz (120.3 kg)    GENERAL:alert, no distress and comfortable SKIN: skin color, texture, turgor are normal, no rashes or significant lesions EYES: normal, Conjunctiva are pink and non-injected, sclera clear OROPHARYNX:no exudate, no erythema and lips, buccal mucosa, and tongue normal  NECK: supple, thyroid normal size, non-tender, without nodularity LYMPH:  no palpable lymphadenopathy in the cervical, axillary or inguinal LUNGS: clear to auscultation and percussion with normal breathing effort HEART: Atrial fibrillation ABDOMEN:abdomen soft, non-tender and normal bowel sounds MUSCULOSKELETAL:no cyanosis of digits and no clubbing  NEURO: alert & oriented x 3 with fluent speech, no focal motor/sensory deficits EXTREMITIES: No lower extremity edema  LABORATORY DATA:  I have reviewed the data as listed   Chemistry      Component Value Date/Time   NA 141 12/27/2015 1524   NA 142 07/07/2015 1226   K 4.1 12/27/2015 1524   K 4.0 07/07/2015 1226   CL 102 12/27/2015 1524   CO2 32 (H) 12/27/2015 1524   CO2 29 07/07/2015 1226   BUN 9 12/27/2015 1524   BUN 10.9 07/07/2015 1226   CREATININE 0.83 12/27/2015 1524   CREATININE 0.8 07/07/2015 1226      Component Value Date/Time   CALCIUM 9.6 12/27/2015 1524   CALCIUM 10.0 07/07/2015 1226   ALKPHOS 127 07/07/2015 1226   AST 22 07/07/2015 1226   ALT 18  07/07/2015 1226   BILITOT 0.52 07/07/2015 1226       Lab Results  Component Value Date   WBC 8.3 12/27/2015   HGB 13.2 12/27/2015   HCT 39.9 12/27/2015   MCV 86.7 12/27/2015   PLT 229 12/27/2015   NEUTROABS 6.6 (H) 07/07/2015    ASSESSMENT & PLAN:  Breast cancer of lower-outer quadrant of right female breast ([place]) Rt Lumpectomy 07/16/15: IDC grade 2, 0.6 cm, with DCIS, 0/4 LN, ER 95%, PR 90% HER-2 negative ratio 1.70, KI 67:15%, Path stage: T1aN0 Stage 1A Oncotype DX score 19, 12% risk of recurrence Status post radiation 09/08/2015 to 10/26/2015  Current treatment: Anastrozole 1 mg daily 5 years started 11/01/2015 stopped March 2018, switched to letrozole 08/28/2016 Letrozole toxicities: 1. Hot flashes 10-15 times per day associated with sweating previously Effexor did not work. She is currently on gabapentin for migraine prevention but it does not seem to reduce  hot flashes. 2. myalgias arthralgias markedly better with letrozole.  Bone density done February 2017  Return to clinic in 1 year for follow-up.  I spent 15 minutes talking to the patient of which more than half was spent in counseling and coordination of care.  No orders of the defined types were placed in this encounter.  The patient has a good understanding of the overall plan. she agrees with it. she will call with any problems that may develop before the next visit here.   Rulon Eisenmenger, MD 11/27/16

## 2016-11-27 NOTE — Assessment & Plan Note (Signed)
Rt Lumpectomy 07/16/15: IDC grade 2, 0.6 cm, with DCIS, 0/4 LN, ER 95%, PR 90% HER-2 negative ratio 1.70, KI 67:15%, Path stage: T1aN0 Stage 1A Oncotype DX score 19, 12% risk of recurrence Status post radiation 09/08/2015 to 10/26/2015  Current treatment: Anastrozole 1 mg daily 5 years started 11/01/2015 stopped March 2018, switched to letrozole 08/28/2016 to start at half a tablet daily and to slowly increase it to twice a day if she tolerates it   Bone density done February 2017  Return to clinic in 6 months for follow-up.

## 2017-01-27 ENCOUNTER — Other Ambulatory Visit: Payer: Self-pay | Admitting: Cardiovascular Disease

## 2017-05-24 ENCOUNTER — Telehealth: Payer: Self-pay

## 2017-05-24 NOTE — Telephone Encounter (Signed)
   [facility] Pre-operative Risk Assessment    Request for surgical clearance:  1. What type of surgery is being performed? Colonoscopy  2. When is this surgery scheduled? 06/29/17  3. Are there any medications that need to be held prior to surgery and how long? Eliquis  4. Practice name and name of physician performing surgery? [facility]   Dr.Outlaw  5. What is your office phone and fax number? Phone # [phone]  Fax # [phone]   6. Anesthesia type (None, local, MAC, general) ? Karie Soda 05/24/2017, 9:46 AM  _________________________________________________________________   (provider comments below)

## 2017-05-25 NOTE — Telephone Encounter (Signed)
  Pre-operative Risk Assessment - Provider Statement    Patient ID:  Brandi Rodgers, DOB: 1950-08-29, MRN: [id number]   Ms. Eckman's chart has been reviewed for pre-operative risk assessment.  Patient has not been seen since 06/26/16.    Because of this patient's past medical history and time since last visit, a follow-up visit is required in order to better asses peri-operative cardiovascular risk. Pre-Op Covering Staff:  (1) Please schedule an appointment and notify patient.   (2) Please contact surgeon or referring provider via preferred method (phone, fax, etc) to notify them of the need for an office visit prior to clearance.    Will also forward to Anticoagulation Clinic to address recommendations for holding Apixaban.  Signed,  Richardson Dopp, PA-C  05/25/2017 10:52 AM

## 2017-05-25 NOTE — Telephone Encounter (Signed)
Patient with diagnosis of atrial fibrillation on Eliquis for anticoagulation.    Procedure: colonoscopy Date of procedure: 06/29/17  CHADS2-VASc score of  3 (HTN, AGE, female)  CrCl 101.7 (adjusted body weight) Platelet count 229 (August 2017)  Per office protocol, patient can hold Eliquis for 2 days prior to procedure.   Patient will not need bridging with Lovenox (enoxaparin) around procedure.  Patient should restart Eliquis on the evening of procedure or day after, at discretion of procedure MD

## 2017-05-28 NOTE — Telephone Encounter (Signed)
Called patient to try and set up an appt for surgical clearance for her upcoming colonoscopy. She has an appt to see Dr.Croitoru on Jul 12, 2017. She stated she would rather keep her appt with Dr.Croitoru and she will call the GI doctor and reschedule her procedure.

## 2017-05-28 NOTE — Telephone Encounter (Signed)
Needs appt please for cardiology cl.

## 2017-05-29 DIAGNOSIS — M47816 Spondylosis without myelopathy or radiculopathy, lumbar region: Secondary | ICD-10-CM | POA: Insufficient documentation

## 2017-06-19 ENCOUNTER — Encounter: Payer: Self-pay | Admitting: Hematology and Oncology

## 2017-07-12 ENCOUNTER — Ambulatory Visit: Payer: Medicare Other | Admitting: Cardiovascular Disease

## 2017-07-12 ENCOUNTER — Encounter: Payer: Self-pay | Admitting: Cardiovascular Disease

## 2017-07-12 VITALS — BP 126/84 | HR 90 | Ht 68.0 in | Wt 245.4 lb

## 2017-07-12 DIAGNOSIS — Z6835 Body mass index (BMI) 35.0-35.9, adult: Secondary | ICD-10-CM

## 2017-07-12 DIAGNOSIS — I480 Paroxysmal atrial fibrillation: Secondary | ICD-10-CM

## 2017-07-12 DIAGNOSIS — I2721 Secondary pulmonary arterial hypertension: Secondary | ICD-10-CM

## 2017-07-12 DIAGNOSIS — G43909 Migraine, unspecified, not intractable, without status migrainosus: Secondary | ICD-10-CM | POA: Diagnosis not present

## 2017-07-12 DIAGNOSIS — Z7901 Long term (current) use of anticoagulants: Secondary | ICD-10-CM | POA: Diagnosis not present

## 2017-07-12 NOTE — Patient Instructions (Signed)
Dr Croitoru recommends that you schedule a follow-up appointment in 12 months. You will receive a reminder letter in the mail two months in advance. If you don't receive a letter, please call our office to schedule the follow-up appointment.  If you need a refill on your cardiac medications before your next appointment, please call your pharmacy. 

## 2017-07-12 NOTE — Progress Notes (Signed)
Cardiology Office Note    Date:  07/13/2017   ID:  Brandi Rodgers, DOB [birth year], MRN [id number]  PCP:  Jonathon Jordan, MD  Cardiologist:   Sanda Klein, MD   Chief complaint: Establish new cardiology follow-up.   History of Present Illness:  Brandi Rodgers is a 67 y.o. female with paroxysmal atrial fibrillation, migraine headaches for which she takes atenolol , on anticoagulation with Eliquis without bleeding complications, morbid obesity, sleep apnea (uses a jaw advancement device).  She is very infrequently bothered by palpitations.  In fact, today she presents in atrial fibrillation with controlled ventricular rate and is completely unaware of the arrhythmia.  Even when she has palpitations, these do not associated shortness of breath, chest pain, dizziness or syncope or other unpleasant cardiac complaints.  She takes atenolol for migraine control and its also serving as a good ventricular rate control medications.  She is compliant with anticoagulation with Eliquis and has not had any bleeding problems or falls.She denies symptoms of stroke or TIA,  exertional angina or dyspnea. She has not had leg edema, claudication, syncope or dizziness.  She has not taken diuretics, in about 2 years.  She has lost 40 pounds in the last year.   Echocardiogram performed in October 2015 showed normal left ventricular wall thickness and ejection fraction, normal left atrial size, but did show mild pulmonary hypertension with an estimated systolic PA pressure 41 mmHg. Has a history of right breast cancer status post surgery and right breast radiation therapy. She does not have angina pectoris or any known coronary or peripheral vascular obstructive lesions. She has mild hyperlipidemia which has been managed with diet alone. She has had problems with edema in the past, but uses furosemide extremely rarely.   Past Medical History:  Diagnosis Date  . Arthritis    cervical and lumbar spine  .  Breast cancer of lower-outer quadrant of right female breast ([place]) 07/02/2015  . Degenerative lumbar disc   . Dental crowns present   . GERD (gastroesophageal reflux disease)   . Hypercholesterolemia    borderline - no current med.  . Jaw clicking   . Migraines   . Osteoarthritis    bilateral knee  . Paroxysmal atrial fibrillation ([place])    last episode was 01/2014  . Radiation 09/07/15-10/26/15   right breast 50.4 Gy, boost to 10 Gy  . Sleep apnea    uses mouth guard at night; no CPAP    Past Surgical History:  Procedure Laterality Date  . CARPAL TUNNEL RELEASE Right 05/13/2003  . DILATION AND CURETTAGE OF UTERUS    . KNEE ARTHROSCOPY Right 04/06/2003  . KNEE ARTHROSCOPY Left 11/30/2003  . RADIOACTIVE SEED GUIDED PARTIAL MASTECTOMY WITH AXILLARY SENTINEL LYMPH NODE BIOPSY Right 07/16/2015   Procedure: RADIOACTIVE SEED GUIDED PARTIAL MASTECTOMY WITH AXILLARY SENTINEL LYMPH NODE BIOPSY;  Surgeon: [name], MD;  Location: [facility];  Service: General;  Laterality: Right;  . TOTAL KNEE ARTHROPLASTY Left 03/23/2014   Procedure: LEFT TOTAL KNEE ARTHROPLASTY ;  Surgeon: [name], MD;  Location: [facility];  Service: Orthopedics;  Laterality: Left;  . TOTAL KNEE ARTHROPLASTY Right 08/21/2014   Procedure: RIGHT TOTAL KNEE ARTHROPLASTY;  Surgeon: Gaynelle Arabian, MD;  Location: [facility];  Service: Orthopedics;  Laterality: Right;    Current Medications: Outpatient Medications Prior to Visit  Medication Sig Dispense Refill  . acetaminophen (TYLENOL) 325 MG tablet     . atenolol (TENORMIN) 50 MG tablet Take 50 mg  by mouth daily.     . Biotin 1 MG CAPS Take 1 capsule by mouth daily.    [name] bismuth subsalicylate (PEPTO BISMOL) 262 MG chewable tablet Chew 524 mg by mouth as needed.    . Calcium-Vitamin D-Vitamin K (VIACTIV PO) Take 1 tablet by mouth daily.    [name] ELIQUIS 5 MG TABS tablet TAKE 1 TABLET (5 MG TOTAL) BY MOUTH 2 (TWO) TIMES DAILY. 180 tablet 1  . furosemide (LASIX) 20 MG  tablet Take 20 mg by mouth daily as needed for fluid. Reported on 10/13/2015    . gabapentin (NEURONTIN) 600 MG tablet Take 600 mg by mouth 3 (three) times daily.    [name] letrozole (FEMARA) 2.5 MG tablet Take 1 tablet (2.5 mg total) by mouth daily. 90 tablet 3  . Multiple Vitamin (MULTIVITAMIN) tablet Take 1 tablet by mouth daily.    . ranitidine (ZANTAC) 75 MG tablet Take 75 mg by mouth 2 (two) times daily as needed for heartburn. Reported on 09/14/2015    . rizatriptan (MAXALT-MLT) 10 MG disintegrating tablet Take 10 mg by mouth as needed for migraine. Reported on 09/14/2015    . traMADol (ULTRAM) 50 MG tablet Take by mouth every 6 (six) hours as needed.    [name] albuterol (PROVENTIL HFA) 108 (90 Base) MCG/ACT inhaler Reported on 10/13/2015    . Turmeric 500 MG CAPS Take 1 capsule by mouth daily.     No facility-administered medications prior to visit.      Allergies:   Adhesive [tape] and Methocarbamol   Social History   Socioeconomic History  . Marital status: Married    Spouse name: None  . Number of children: None  . Years of education: None  . Highest education level: None  Social Needs  . Financial resource strain: None  . Food insecurity - worry: None  . Food insecurity - inability: None  . Transportation needs - medical: None  . Transportation needs - non-medical: None  Occupational History  . None  Tobacco Use  . Smoking status: Never Smoker  . Smokeless tobacco: Never Used  Substance and Sexual Activity  . Alcohol use: Yes    Comment: occasionally  . Drug use: No  . Sexual activity: Yes  Other Topics Concern  . None  Social History Narrative  . None     Family History:  The patient's family history includes Lung cancer in her mother; Thyroid cancer in her mother.   ROS:   Please see the history of present illness.    ROS All other systems reviewed and are negative.   PHYSICAL EXAM:   VS:  BP 126/84   Pulse 90   Ht 5\' 8"  (1.727 m)   Wt 245 lb 6.4 oz (111.3 kg)    BMI 37.31 kg/m     General: Alert, oriented x3, no distress, obese Head: no evidence of trauma, PERRL, EOMI, no exophtalmos or lid lag, no myxedema, no xanthelasma; normal ears, nose and oropharynx Neck: normal jugular venous pulsations and no hepatojugular reflux; brisk carotid pulses without delay and no carotid bruits Chest: clear to auscultation, no signs of consolidation by percussion or palpation, normal fremitus, symmetrical and full respiratory excursions Cardiovascular: normal position and quality of the apical impulse, irregular rhythm, normal first and second heart sounds, no murmurs, rubs or gallops Abdomen: no tenderness or distention, no masses by palpation, no abnormal pulsatility or arterial bruits, normal bowel sounds, no hepatosplenomegaly Extremities: no clubbing, cyanosis or edema; 2+ radial, ulnar and  brachial pulses bilaterally; 2+ right femoral, posterior tibial and dorsalis pedis pulses; 2+ left femoral, posterior tibial and dorsalis pedis pulses; no subclavian or femoral bruits Neurological: grossly nonfocal Psych: Normal mood and affect   Wt Readings from Last 3 Encounters:  07/12/17 245 lb 6.4 oz (111.3 kg)  11/27/16 265 lb 4.8 oz (120.3 kg)  08/28/16 282 lb 12.8 oz (128.3 kg)      Studies/Labs Reviewed:   EKG:  EKG is ordered today.  The ekg ordered today demonstrates atrial fibrillation with controlled rate, otherwise normal, QTC 411 ms  Recent Labs: No results found for requested labs within last 8760 hours.   ASSESSMENT:    1. Paroxysmal atrial fibrillation (HCC)   2. Long term (current) use of anticoagulants   3. Severe obesity (BMI 35.0-35.9 with comorbidity) ([place])   4. Migraine without status migrainosus, not intractable, unspecified migraine type   5. PAH (pulmonary artery hypertension) (HCC)      PLAN:  In order of problems listed above:  1. AFib: The burden of arrhythmia appears to be low. she is in it today and is asymptomatic. Higher  doses of beta blocker were not well tolerated in the past. True antiarrhythmics and ablation do not appear justified at this point. 2. Eliquis: CHADSVasc 2 : age, gender. Brief interruptions of anticoagulation for surgical procedures should be quite safe.  3. Severe obesity: Congratulated on excellent weight loss achieved so far. Continued efforts at weight loss are justified. 4. Migraines: For the most part, controlled on beta blocker and PRN triptan. 5. PAH: Mild by previous echo. Asymptomatic. No edema at this time. Might be related to obesity and sleep apnea. Pharmacological therapy is not indicated. Efforts at weight loss would be beneficial.    Medication Adjustments/Labs and Tests Ordered: Current medicines are reviewed at length with the patient today.  Concerns regarding medicines are outlined above.  Medication changes, Labs and Tests ordered today are listed in the Patient Instructions below. Patient Instructions  Dr Sallyanne Kuster recommends that you schedule a follow-up appointment in 12 months. You will receive a reminder letter in the mail two months in advance. If you don't receive a letter, please call our office to schedule the follow-up appointment.  If you need a refill on your cardiac medications before your next appointment, please call your pharmacy.    Signed, Sanda Klein, MD  07/13/2017 10:07 AM    [facility] [place], [place], [place]  [address] Phone: [phone]; Fax: [phone]

## 2017-07-25 ENCOUNTER — Other Ambulatory Visit: Payer: Self-pay | Admitting: Cardiovascular Disease

## 2017-07-31 ENCOUNTER — Telehealth: Payer: Self-pay | Admitting: Cardiovascular Disease

## 2017-07-31 NOTE — Telephone Encounter (Signed)
Patient calling, states that her orthopaedic doctor prescribed she start taking Magnesium 500 mg to help with muscle spasms. Patient states that medication also helps with AFIB and headaches, patient has AFIB and would like to verify that it would be okay to take.

## 2017-07-31 NOTE — Telephone Encounter (Signed)
Is it ok to take Ginko Biloba.Message sent to pharmacy for advice.

## 2017-07-31 NOTE — Telephone Encounter (Signed)
Returned call to patient.She wanted to know if ok to take magnesium 500 mg daily and if ok to take ginko biloba.Message sent to pharmacy for advice.

## 2017-07-31 NOTE — Telephone Encounter (Signed)
Ok to take magnesium

## 2017-08-01 NOTE — Telephone Encounter (Signed)
Patient should NOT take Ginko biloba.    Not recommended with patient on anticoagulation therapy. May also worsen migraine headaches.

## 2017-08-01 NOTE — Telephone Encounter (Signed)
Returned call to patient pharmacist advised ok to take magnesium.Advised DO NOT take ginko biloba.

## 2017-08-03 DIAGNOSIS — M7541 Impingement syndrome of right shoulder: Secondary | ICD-10-CM | POA: Insufficient documentation

## 2017-11-23 ENCOUNTER — Other Ambulatory Visit: Payer: Self-pay | Admitting: Hematology and Oncology

## 2017-11-27 ENCOUNTER — Inpatient Hospital Stay: Payer: Medicare Other | Attending: Hematology and Oncology | Admitting: Hematology and Oncology

## 2017-11-27 ENCOUNTER — Telehealth: Payer: Self-pay | Admitting: Hematology and Oncology

## 2017-11-27 DIAGNOSIS — Z79899 Other long term (current) drug therapy: Secondary | ICD-10-CM | POA: Diagnosis not present

## 2017-11-27 DIAGNOSIS — Z79811 Long term (current) use of aromatase inhibitors: Secondary | ICD-10-CM | POA: Diagnosis not present

## 2017-11-27 DIAGNOSIS — C50511 Malignant neoplasm of lower-outer quadrant of right female breast: Secondary | ICD-10-CM

## 2017-11-27 DIAGNOSIS — Z923 Personal history of irradiation: Secondary | ICD-10-CM | POA: Diagnosis not present

## 2017-11-27 DIAGNOSIS — N951 Menopausal and female climacteric states: Secondary | ICD-10-CM | POA: Diagnosis not present

## 2017-11-27 DIAGNOSIS — Z170 Estrogen receptor positive status [ER+]: Secondary | ICD-10-CM

## 2017-11-27 MED ORDER — EXEMESTANE 25 MG PO TABS: 25.0000 mg | ORAL_TABLET | Freq: Every day | ORAL | 3 refills | Status: DC

## 2017-11-27 NOTE — Progress Notes (Signed)
Patient Care Team: Jonathon Jordan, MD as PCP - General (Family Medicine) Sanda Klein, MD as PCP - Cardiology (Cardiology) Sylvan Cheese, NP as Nurse Practitioner (Hematology and Oncology)  DIAGNOSIS:  Encounter Diagnosis  Name Primary?  . Malignant neoplasm of lower-outer quadrant of right breast of female, estrogen receptor positive ([place])     SUMMARY OF ONCOLOGIC HISTORY:   Breast cancer of lower-outer quadrant of right female breast ([place])   06/25/2015 Initial Diagnosis    Right breast screening det focal asymmetry plus calcs 4 mm at 8:00 axilla neg, 3-D biopsy: Grade 1 IDC with DCIS and calcifications, ER 95%, PR 90% HER-2 negative ratio 1.70, KI 67:15%, clinical stage: T1aN0 Stage 1A       07/16/2015 Surgery    Rt Lumpectomy [facility]): IDC grade 2, 0.6 cm, with DCIS, 0/4 LN, ER 95%, PR 90% HER-2 negative ratio 1.70, KI 67:15%, Path stage: T1aN0 Stage 1A , Oncotype DX score 19, 12% ROR      09/07/2015 - 10/26/2015 Radiation Therapy    Adjuvant radiation therapy ([place]). Right breast treated to 50.4 Gy in 28 fractions.  Right breast boost treated to 10 Gy in 5 fractions       11/01/2015 -  Anti-estrogen oral therapy    Anastrozole 1 mg daily stopped March 2018 due to severe hot flashes switched to letrozole 08/28/2016       CHIEF COMPLIANT: Follow-up on letrozole therapy  INTERVAL HISTORY: Brandi Rodgers is a 67 year old with above-mentioned history of right breast cancer currently on letrozole therapy.  She appears to be having hot flashes which are quite severe almost every hour.  She wishes to know if there is anything else that she can try.  Denies any lumps or nodules in the breast.  REVIEW OF SYSTEMS:   Constitutional: Denies fevers, chills or abnormal weight loss Eyes: Denies blurriness of vision Ears, nose, mouth, throat, and face: Denies mucositis or sore throat Respiratory: Denies cough, dyspnea or wheezes Cardiovascular: Denies palpitation, chest  discomfort Gastrointestinal:  Denies nausea, heartburn or change in bowel habits Skin: Denies abnormal skin rashes Lymphatics: Denies new lymphadenopathy or easy bruising Neurological:Denies numbness, tingling or new weaknesses Behavioral/Psych: Mood is stable, no new changes  Extremities: No lower extremity edema Breast:  denies any pain or lumps or nodules in either breasts All other systems were reviewed with the patient and are negative.  I have reviewed the past medical history, past surgical history, social history and family history with the patient and they are unchanged from previous note.  ALLERGIES:  is allergic to adhesive [tape] and methocarbamol.  MEDICATIONS:  Current Outpatient Medications  Medication Sig Dispense Refill  . acetaminophen (TYLENOL) 325 MG tablet     . atenolol (TENORMIN) 50 MG tablet Take 50 mg by mouth daily.     [name] bismuth subsalicylate (PEPTO BISMOL) 262 MG chewable tablet Chew 524 mg by mouth as needed.    . Calcium-Vitamin D-Vitamin K (VIACTIV PO) Take 1 tablet by mouth daily.    [name] ELIQUIS 5 MG TABS tablet TAKE 1 TABLET (5 MG TOTAL) BY MOUTH 2 (TWO) TIMES DAILY. 180 tablet 1  . exemestane (AROMASIN) 25 MG tablet Take 1 tablet (25 mg total) by mouth daily after breakfast. 90 tablet 3  . gabapentin (NEURONTIN) 600 MG tablet Take 600 mg by mouth 3 (three) times daily.    [name] letrozole (FEMARA) 2.5 MG tablet TAKE 1 TABLET BY MOUTH EVERY DAY 90 tablet 0  . Multiple Vitamin (MULTIVITAMIN)  tablet Take 1 tablet by mouth daily.    . rizatriptan (MAXALT-MLT) 10 MG disintegrating tablet Take 10 mg by mouth as needed for migraine. Reported on 09/14/2015     No current facility-administered medications for this visit.     PHYSICAL EXAMINATION: ECOG PERFORMANCE STATUS: 1 - Symptomatic but completely ambulatory  Vitals:   11/27/17 1007  BP: 131/65  Pulse: 84  Resp: 18  Temp: 98.6 F (37 C)  SpO2: 97%   Filed Weights   11/27/17 1007  Weight: 243 lb 11.2 oz  (110.5 kg)    GENERAL:alert, no distress and comfortable SKIN: skin color, texture, turgor are normal, no rashes or significant lesions EYES: normal, Conjunctiva are pink and non-injected, sclera clear OROPHARYNX:no exudate, no erythema and lips, buccal mucosa, and tongue normal  NECK: supple, thyroid normal size, non-tender, without nodularity LYMPH:  no palpable lymphadenopathy in the cervical, axillary or inguinal LUNGS: clear to auscultation and percussion with normal breathing effort HEART: regular rate & rhythm and no murmurs and no lower extremity edema ABDOMEN:abdomen soft, non-tender and normal bowel sounds MUSCULOSKELETAL:no cyanosis of digits and no clubbing  NEURO: alert & oriented x 3 with fluent speech, no focal motor/sensory deficits EXTREMITIES: No lower extremity edema BREAST: No palpable masses or nodules in either right or left breasts. No palpable axillary supraclavicular or infraclavicular adenopathy no breast tenderness or nipple discharge. (exam performed in the presence of a chaperone)  LABORATORY DATA:  I have reviewed the data as listed CMP Latest Ref Rng & Units 12/27/2015 07/07/2015 08/23/2014  Glucose 65 - 99 mg/dL 98 91 136(H)  BUN 7 - 25 mg/dL 9 10.9 11  Creatinine 0.50 - 0.99 mg/dL 0.83 0.8 0.57  Sodium 135 - 146 mmol/L 141 142 138  Potassium 3.5 - 5.3 mmol/L 4.1 4.0 4.6  Chloride 98 - 110 mmol/L 102 - 103  CO2 20 - 31 mmol/L 32(H) 29 30  Calcium 8.6 - 10.4 mg/dL 9.6 10.0 8.8  Total Protein 6.4 - 8.3 g/dL - 7.1 -  Total Bilirubin 0.20 - 1.20 mg/dL - 0.52 -  Alkaline Phos 40 - 150 U/L - 127 -  AST 5 - 34 U/L - 22 -  ALT 0 - 55 U/L - 18 -    Lab Results  Component Value Date   WBC 8.3 12/27/2015   HGB 13.2 12/27/2015   HCT 39.9 12/27/2015   MCV 86.7 12/27/2015   PLT 229 12/27/2015   NEUTROABS 6.6 (H) 07/07/2015    ASSESSMENT & PLAN:  Breast cancer of lower-outer quadrant of right female breast ([place]) Rt Lumpectomy 07/16/15: IDC grade 2, 0.6 cm,  with DCIS, 0/4 LN, ER 95%, PR 90% HER-2 negative ratio 1.70, KI 67:15%, Path stage: T1aN0 Stage 1A Oncotype DX score 19, 12% risk of recurrence Status post radiation 09/08/2015 to 10/26/2015  Current treatment: Anastrozole 1 mg daily 5 years started 11/01/2015 stopped March 2018, switched to letrozole 08/28/2016 because of continuing hot flashes I sent a prescription for exemestane.  She will take exemestane if it is well covered by her insurance.  If it is not well covered and she will go back to letrozole.  Letrozole toxicities: 1. Hot flashes 10-15 times per day associated with sweating previously Effexor did not work. She is currently on gabapentin for migraine prevention but it does not seem to reduce hot flashes. 2. myalgias arthralgias markedly better with letrozole.  Bone density done February 2017  Breast cancer surveillance: 1. Breast exam  11/27/2017: No abnormalities of  concern 2. Mammograms at [facility]  Return to clinic in 1 year for follow-up.  No orders of the defined types were placed in this encounter.  The patient has a good understanding of the overall plan. she agrees with it. she will call with any problems that may develop before the next visit here.   Harriette Ohara, MD 11/27/17

## 2017-11-27 NOTE — Telephone Encounter (Signed)
Gave patient avs and calendar of upcoming July 2020 appts.  °

## 2017-11-27 NOTE — Assessment & Plan Note (Addendum)
Rt Lumpectomy 07/16/15: IDC grade 2, 0.6 cm, with DCIS, 0/4 LN, ER 95%, PR 90% HER-2 negative ratio 1.70, KI 67:15%, Path stage: T1aN0 Stage 1A Oncotype DX score 19, 12% risk of recurrence Status post radiation 09/08/2015 to 10/26/2015  Current treatment: Anastrozole 1 mg daily 5 years started 11/01/2015 stopped March 2018, switched to letrozole 08/28/2016  Letrozole toxicities: 1. Hot flashes 10-15 times per day associated with sweating previously Effexor did not work. She is currently on gabapentin for migraine prevention but it does not seem to reduce hot flashes. 2. myalgias arthralgias markedly better with letrozole.  Bone density done February 2017  Breast cancer surveillance: 1. Breast exam  11/27/2017: No abnormalities of concern 2. Mammograms at [facility]  Return to clinic in 1 year for follow-up.

## 2018-01-25 ENCOUNTER — Other Ambulatory Visit (HOSPITAL_COMMUNITY)
Admission: RE | Admit: 2018-01-25 | Discharge: 2018-01-25 | Disposition: A | Discharge status: Home / Self Care | Payer: Medicare Other | Source: Ambulatory Visit | Attending: Family Medicine | Admitting: Family Medicine

## 2018-01-25 ENCOUNTER — Other Ambulatory Visit: Payer: Self-pay | Admitting: Family Medicine

## 2018-01-25 DIAGNOSIS — Z01411 Encounter for gynecological examination (general) (routine) with abnormal findings: Secondary | ICD-10-CM | POA: Insufficient documentation

## 2018-01-26 ENCOUNTER — Other Ambulatory Visit: Payer: Self-pay | Admitting: Cardiovascular Disease

## 2018-01-28 ENCOUNTER — Telehealth: Payer: Self-pay

## 2018-01-28 LAB — CYTOLOGY - PAP: DIAGNOSIS: NEGATIVE

## 2018-01-28 NOTE — Telephone Encounter (Signed)
Received phone call from pt's PCP, Jonathon Jordan, PA, regarding premarin cream 2 x week for pt. Returned call and spoke with Blanch Media.  Explained that premarin cream would not be appropriate for this pt d/t she is ER + breast cancer and is currently taking anti estrogen medications.

## 2018-02-18 ENCOUNTER — Other Ambulatory Visit: Payer: Self-pay | Admitting: Hematology and Oncology

## 2018-06-19 ENCOUNTER — Other Ambulatory Visit: Payer: Self-pay | Admitting: Hematology and Oncology

## 2018-07-18 ENCOUNTER — Ambulatory Visit: Payer: Medicare Other | Admitting: Cardiovascular Disease

## 2018-07-18 ENCOUNTER — Encounter: Payer: Self-pay | Admitting: Cardiovascular Disease

## 2018-07-18 VITALS — BP 150/66 | HR 85 | Ht 68.0 in | Wt 255.0 lb

## 2018-07-18 DIAGNOSIS — I2721 Secondary pulmonary arterial hypertension: Secondary | ICD-10-CM | POA: Diagnosis not present

## 2018-07-18 DIAGNOSIS — I4819 Other persistent atrial fibrillation: Secondary | ICD-10-CM | POA: Diagnosis not present

## 2018-07-18 DIAGNOSIS — Z6838 Body mass index (BMI) 38.0-38.9, adult: Secondary | ICD-10-CM

## 2018-07-18 DIAGNOSIS — Z7901 Long term (current) use of anticoagulants: Secondary | ICD-10-CM | POA: Insufficient documentation

## 2018-07-18 NOTE — Progress Notes (Signed)
Cardiology Office Note    Date:  07/18/2018   ID:  Brandi Rodgers, DOB [birth year], MRN [id number]  PCP:  Jonathon Jordan, MD  Cardiologist:   Sanda Klein, MD   Chief complaint: Atrial fibrillation   History of Present Illness:  Brandi Rodgers is a 68 y.o. female with paroxysmal atrial fibrillation, migraine headaches for which she takes atenolol , on anticoagulation with Eliquis without bleeding complications, morbid obesity, sleep apnea (uses a jaw advancement device).  Just like at her last office visit she is in atrial fibrillation today and is unaware of the arrhythmia.  She is occasionally troubled by palpitations, always brief, always at rest.  She is on atenolol for migraine prevention and her current ventricular rate is 85 bpm.  She is compliant with oral anticoagulant and has not had any neurological events or any bleeding problems.  She denies falls or injuries.  She has not taken diuretics in months.  The patient specifically denies any chest pain at rest exertion, dyspnea at rest or with exertion, orthopnea, paroxysmal nocturnal dyspnea, syncope, focal neurological deficits, intermittent claudication, lower extremity edema, unexplained weight gain, cough, hemoptysis or wheezing.    Echocardiogram performed in October 2015 showed normal left ventricular wall thickness and ejection fraction, normal left atrial size, but did show mild pulmonary hypertension with an estimated systolic PA pressure 41 mmHg. Has a history of right breast cancer status post surgery and right breast radiation therapy. She does not have angina pectoris or any known coronary or peripheral vascular obstructive lesions. She has mild hyperlipidemia which has been managed with diet alone. She has had problems with edema in the past, but uses furosemide extremely rarely.   Past Medical History:  Diagnosis Date  . Arthritis    cervical and lumbar spine  . Breast cancer of lower-outer quadrant of right  female breast ([place]) 07/02/2015  . Degenerative lumbar disc   . Dental crowns present   . GERD (gastroesophageal reflux disease)   . Hypercholesterolemia    borderline - no current med.  . Jaw clicking   . Migraines   . Osteoarthritis    bilateral knee  . Paroxysmal atrial fibrillation ([place])    last episode was 01/2014  . Radiation 09/07/15-10/26/15   right breast 50.4 Gy, boost to 10 Gy  . Sleep apnea    uses mouth guard at night; no CPAP    Past Surgical History:  Procedure Laterality Date  . CARPAL TUNNEL RELEASE Right 05/13/2003  . DILATION AND CURETTAGE OF UTERUS    . KNEE ARTHROSCOPY Right 04/06/2003  . KNEE ARTHROSCOPY Left 11/30/2003  . RADIOACTIVE SEED GUIDED PARTIAL MASTECTOMY WITH AXILLARY SENTINEL LYMPH NODE BIOPSY Right 07/16/2015   Procedure: RADIOACTIVE SEED GUIDED PARTIAL MASTECTOMY WITH AXILLARY SENTINEL LYMPH NODE BIOPSY;  Surgeon: [name], MD;  Location: [facility];  Service: General;  Laterality: Right;  . TOTAL KNEE ARTHROPLASTY Left 03/23/2014   Procedure: LEFT TOTAL KNEE ARTHROPLASTY ;  Surgeon: [name], MD;  Location: [facility];  Service: Orthopedics;  Laterality: Left;  . TOTAL KNEE ARTHROPLASTY Right 08/21/2014   Procedure: RIGHT TOTAL KNEE ARTHROPLASTY;  Surgeon: Gaynelle Arabian, MD;  Location: [facility];  Service: Orthopedics;  Laterality: Right;    Current Medications: Outpatient Medications Prior to Visit  Medication Sig Dispense Refill  . acetaminophen (TYLENOL) 325 MG tablet     . atenolol (TENORMIN) 50 MG tablet Take 50 mg by mouth daily.     [name] bismuth subsalicylate (PEPTO  BISMOL) 262 MG chewable tablet Chew 524 mg by mouth as needed.    . Calcium-Vitamin D-Vitamin K (VIACTIV PO) Take 1 tablet by mouth daily.    [name] ELIQUIS 5 MG TABS tablet TAKE 1 TABLET (5 MG TOTAL) BY MOUTH 2 (TWO) TIMES DAILY. 180 tablet 1  . gabapentin (NEURONTIN) 600 MG tablet Take 600 mg by mouth 3 (three) times daily.    [name] letrozole (FEMARA) 2.5 MG tablet TAKE  1 TABLET BY MOUTH EVERY DAY 90 tablet 0  . Multiple Vitamin (MULTIVITAMIN) tablet Take 1 tablet by mouth daily.    . rizatriptan (MAXALT-MLT) 10 MG disintegrating tablet Take 10 mg by mouth as needed for migraine. Reported on 09/14/2015    . exemestane (AROMASIN) 25 MG tablet Take 1 tablet (25 mg total) by mouth daily after breakfast. 90 tablet 3   No facility-administered medications prior to visit.      Allergies:   Adhesive [tape] and Methocarbamol   Social History   Socioeconomic History  . Marital status: Married    Spouse name: Not on file  . Number of children: Not on file  . Years of education: Not on file  . Highest education level: Not on file  Occupational History  . Not on file  Social Needs  . Financial resource strain: Not on file  . Food insecurity:    Worry: Not on file    Inability: Not on file  . Transportation needs:    Medical: Not on file    Non-medical: Not on file  Tobacco Use  . Smoking status: Never Smoker  . Smokeless tobacco: Never Used  Substance and Sexual Activity  . Alcohol use: Yes    Comment: occasionally  . Drug use: No  . Sexual activity: Yes  Lifestyle  . Physical activity:    Days per week: Not on file    Minutes per session: Not on file  . Stress: Not on file  Relationships  . Social connections:    Talks on phone: Not on file    Gets together: Not on file    Attends religious service: Not on file    Active member of club or organization: Not on file    Attends meetings of clubs or organizations: Not on file    Relationship status: Not on file  Other Topics Concern  . Not on file  Social History Narrative  . Not on file     Family History:  The patient's family history includes Lung cancer in her mother; Thyroid cancer in her mother.   ROS:   Please see the history of present illness.    ROS all other systems are reviewed and are negative   PHYSICAL EXAM:   VS:  BP (!) 150/66   Pulse 85   Ht 5\' 8"  (1.727 m)   Wt 255  lb (115.7 kg)   SpO2 98%   BMI 38.77 kg/m      General: Alert, oriented x3, no distress, severely obese Head: no evidence of trauma, PERRL, EOMI, no exophtalmos or lid lag, no myxedema, no xanthelasma; normal ears, nose and oropharynx Neck: normal jugular venous pulsations and no hepatojugular reflux; brisk carotid pulses without delay and no carotid bruits Chest: clear to auscultation, no signs of consolidation by percussion or palpation, normal fremitus, symmetrical and full respiratory excursions Cardiovascular: normal position and quality of the apical impulse, irregular rhythm, normal first and second heart sounds, no murmurs, rubs or gallops Abdomen: no tenderness or distention, no  masses by palpation, no abnormal pulsatility or arterial bruits, normal bowel sounds, no hepatosplenomegaly Extremities: no clubbing, cyanosis or edema; 2+ radial, ulnar and brachial pulses bilaterally; 2+ right femoral, posterior tibial and dorsalis pedis pulses; 2+ left femoral, posterior tibial and dorsalis pedis pulses; no subclavian or femoral bruits Neurological: grossly nonfocal Psych: Normal mood and affect    Wt Readings from Last 3 Encounters:  07/18/18 255 lb (115.7 kg)  11/27/17 243 lb 11.2 oz (110.5 kg)  07/12/17 245 lb 6.4 oz (111.3 kg)      Studies/Labs Reviewed:   EKG:  EKG is ordered today.  It shows atrial fibrillation but is otherwise a normal tracing.  Recent Labs: No results found for requested labs within last 8760 hours.  Lipid Panel  No results found for: CHOL, TRIG, HDL, CHOLHDL, VLDL, LDLCALC, LDLDIRECT  ASSESSMENT:    1. Other persistent atrial fibrillation   2. Long term (current) use of anticoagulants   3. Class 2 severe obesity due to excess calories with serious comorbidity and body mass index (BMI) of 38.0 to 38.9 in adult ([place])   4. PAH (pulmonary artery hypertension) ([facility])      PLAN:  In order of problems listed above:  1. AFib: She is in atrial  fibrillation today and is completely asymptomatic.  It is possible that this has now settled into a pattern of long-term persistent atrial fibrillation.  Cardioversion, antiarrhythmics, ablation are not justified in this asymptomatic patient. 2. Eliquis: CHADSVasc 2 : age, gender. Brief interruptions of anticoagulation for surgical procedures should be quite safe.  3. Severe obesity: Some of her success with weight loss has been reversed over the last year.  Encourage her to continue her attempts to lose weight. 4. PAH: Mild by previous echo. Asymptomatic. No edema at this time. Might be related to obesity and sleep apnea.  She does not have symptoms of daytime hypersomnolence.  She is wearing a jaw advancement device and is not on CPAP.   Efforts at weight loss would be beneficial.    Medication Adjustments/Labs and Tests Ordered: Current medicines are reviewed at length with the patient today.  Concerns regarding medicines are outlined above.  Medication changes, Labs and Tests ordered today are listed in the Patient Instructions below. Patient Instructions  Medication Instructions:  Your physician recommends that you continue on your current medications as directed. Please refer to the Current Medication list given to you today.  If you need a refill on your cardiac medications before your next appointment, please call your pharmacy.    Follow-Up: At [facility], you and your health needs are our priority.  As part of our continuing mission to provide you with exceptional heart care, we have created designated Provider Care Teams.  These Care Teams include your primary Cardiologist (physician) and Advanced Practice Providers (APPs -  Physician Assistants and Nurse Practitioners) who all work together to provide you with the care you need, when you need it. You will need a follow up appointment in 1 year.  Please call our office 2 months in advance to schedule this appointment.  You may see  Sanda Klein, MD or one of the following Advanced Practice Providers on your designated Care Team: [place], [place] . Fabian Sharp, PA-C  Any Other Special Instructions Will Be Listed Below (If Applicable). None      Signed, Sanda Klein, MD  07/18/2018 1:32 PM    [facility] [place], [place], [place]  [address]  Phone: [phone]; Fax: [phone]

## 2018-07-18 NOTE — Patient Instructions (Signed)
Medication Instructions:  Your physician recommends that you continue on your current medications as directed. Please refer to the Current Medication list given to you today.  If you need a refill on your cardiac medications before your next appointment, please call your pharmacy.   Follow-Up: At [facility], you and your health needs are our priority.  As part of our continuing mission to provide you with exceptional heart care, we have created designated Provider Care Teams.  These Care Teams include your primary Cardiologist (physician) and Advanced Practice Providers (APPs -  Physician Assistants and Nurse Practitioners) who all work together to provide you with the care you need, when you need it. You will need a follow up appointment in 1 year.  Please call our office 2 months in advance to schedule this appointment.  You may see Mihai Croitoru, MD or one of the following Advanced Practice Providers on your designated Care Team: Hao Meng, PA-C . Angela Duke, PA-C  Any Other Special Instructions Will Be Listed Below (If Applicable). None   

## 2018-07-23 ENCOUNTER — Other Ambulatory Visit: Payer: Self-pay | Admitting: Cardiovascular Disease

## 2018-07-24 ENCOUNTER — Telehealth: Payer: Self-pay | Admitting: Cardiovascular Disease

## 2018-07-24 NOTE — Telephone Encounter (Signed)
Per last note,  It states it would be safe to hold eliquis, do you have any recommendations as to how long?  Thanks!

## 2018-07-24 NOTE — Telephone Encounter (Signed)
Message was faxed to Dr.Outlaw at [place]. Fax number- [phone]

## 2018-07-24 NOTE — Telephone Encounter (Signed)
New Message   Patient states she needs to have Colonoscopy done and the doctor needs her to hold her Eliquis.  She states she spoke with Dr. Loletha Grayer about it, and the nurse was suppose to be faxing to okay over to her doctors office.

## 2018-07-24 NOTE — Telephone Encounter (Signed)
Low CHADs-VASc . May hold Eliquis for 2 days prior to prior to colonoscopy.  I don't see clearance request in the system or letter sent to/from DR Croitoru.  Please use most recent OV note.

## 2018-09-12 ENCOUNTER — Other Ambulatory Visit: Payer: Self-pay | Admitting: Hematology and Oncology

## 2018-11-20 ENCOUNTER — Telehealth: Payer: Self-pay | Admitting: Hematology and Oncology

## 2018-11-20 NOTE — Assessment & Plan Note (Signed)
Rt Lumpectomy 07/16/15: IDC grade 2, 0.6 cm, with DCIS, 0/4 LN, ER 95%, PR 90% HER-2 negative ratio 1.70, KI 67:15%, Path stage: T1aN0 Stage 1A Oncotype DX score 19, 12% risk of recurrence Status post radiation 09/08/2015 to 10/26/2015  Current treatment: Anastrozole 1 mg daily 5 years started 11/01/2015 stopped March 2018, switched to letrozole 08/28/2016 because of continuing hot flashes I sent a prescription for exemestane.  She will take exemestane if it is well covered by her insurance.  If it is not well covered and she will go back to letrozole.  Letrozole toxicities: 1.Hot flashes 10-15 times per day associated with sweating previously Effexor did not work. She is currently on gabapentin for migraine prevention but it does not seem to reduce hot flashes. 2.myalgias arthralgias markedly better with letrozole.  Bone density done February 2017  Breast cancer surveillance: 1. Breast exam  11/27/2017: No abnormalities of concern 2. Mammograms at [facility] 06/19/2017: Benign, breast density category 8  Return to clinic in1 yearfor follow-up.

## 2018-11-20 NOTE — Telephone Encounter (Signed)
I talk with patient regarding video visit °

## 2018-11-25 NOTE — Progress Notes (Signed)
HEMATOLOGY-ONCOLOGY MYCHART VIDEO VISIT PROGRESS NOTE  I connected with Amparo Bristol on 11/26/2018 at 10:15 AM EDT by MyChart video conference and verified that I am speaking with the correct person using two identifiers.  I discussed the limitations, risks, security and privacy concerns of performing an evaluation and management service by MyChart and the availability of in person appointments.  I also discussed with the patient that there may be a patient responsible charge related to this service. The patient expressed understanding and agreed to proceed.  Patient's Location: Home Physician Location: Clinic  CHIEF COMPLIANT: Follow-up on letrozole therapy  INTERVAL HISTORY: Brandi Rodgers is a 68 y.o. female with above-mentioned history of right breast cancer treated with lumpectomy, radiation and who is currently on anti-estrogen therapy with letrozole. I last saw her a year ago. She presents over MyChart for annual follow-up.    Oncology History  Breast cancer of lower-outer quadrant of right female breast ([place])  06/25/2015 Initial Diagnosis   Right breast screening det focal asymmetry plus calcs 4 mm at 8:00 axilla neg, 3-D biopsy: Grade 1 IDC with DCIS and calcifications, ER 95%, PR 90% HER-2 negative ratio 1.70, KI 67:15%, clinical stage: T1aN0 Stage 1A    07/16/2015 Surgery   Rt Lumpectomy [facility]): IDC grade 2, 0.6 cm, with DCIS, 0/4 LN, ER 95%, PR 90% HER-2 negative ratio 1.70, KI 67:15%, Path stage: T1aN0 Stage 1A , Oncotype DX score 19, 12% ROR   09/07/2015 - 10/26/2015 Radiation Therapy   Adjuvant radiation therapy ([place]). Right breast treated to 50.4 Gy in 28 fractions.  Right breast boost treated to 10 Gy in 5 fractions    11/01/2015 -  Anti-estrogen oral therapy   Anastrozole 1 mg daily stopped March 2018 due to severe hot flashes switched to letrozole 08/28/2016 switched to exemestane 11/27/2017     REVIEW OF SYSTEMS:   Constitutional: Denies fevers, chills or  abnormal weight loss Eyes: Denies blurriness of vision Ears, nose, mouth, throat, and face: Denies mucositis or sore throat Respiratory: Denies cough, dyspnea or wheezes Cardiovascular: Denies palpitation, chest discomfort Gastrointestinal:  Denies nausea, heartburn or change in bowel habits Skin: Denies abnormal skin rashes Lymphatics: Denies new lymphadenopathy or easy bruising Neurological:Denies numbness, tingling or new weaknesses Behavioral/Psych: Mood is stable, no new changes  Extremities: No lower extremity edema Breast: denies any pain or lumps or nodules in either breasts All other systems were reviewed with the patient and are negative.  Observations/Objective:  There were no vitals filed for this visit. There is no height or weight on file to calculate BMI.  I have reviewed the data as listed CMP Latest Ref Rng & Units 12/27/2015 07/07/2015 08/23/2014  Glucose 65 - 99 mg/dL 98 91 136(H)  BUN 7 - 25 mg/dL 9 10.9 11  Creatinine 0.50 - 0.99 mg/dL 0.83 0.8 0.57  Sodium 135 - 146 mmol/L 141 142 138  Potassium 3.5 - 5.3 mmol/L 4.1 4.0 4.6  Chloride 98 - 110 mmol/L 102 - 103  CO2 20 - 31 mmol/L 32(H) 29 30  Calcium 8.6 - 10.4 mg/dL 9.6 10.0 8.8  Total Protein 6.4 - 8.3 g/dL - 7.1 -  Total Bilirubin 0.20 - 1.20 mg/dL - 0.52 -  Alkaline Phos 40 - 150 U/L - 127 -  AST 5 - 34 U/L - 22 -  ALT 0 - 55 U/L - 18 -    Lab Results  Component Value Date   WBC 8.3 12/27/2015   HGB 13.2 12/27/2015  HCT 39.9 12/27/2015   MCV 86.7 12/27/2015   PLT 229 12/27/2015   NEUTROABS 6.6 (H) 07/07/2015      Assessment Plan:  Breast cancer of lower-outer quadrant of right female breast ([place]) Rt Lumpectomy 07/16/15: IDC grade 2, 0.6 cm, with DCIS, 0/4 LN, ER 95%, PR 90% HER-2 negative ratio 1.70, KI 67:15%, Path stage: T1aN0 Stage 1A Oncotype DX score 19, 12% risk of recurrence Status post radiation 09/08/2015 to 10/26/2015  Current treatment: Anastrozole 1 mg daily 5 years started  11/01/2015 stopped March 2018, switched to letrozole 08/28/2016 because of continuing hot flashes I sent a prescription for exemestane (did not make a difference).   she went  back to letrozole.  Letrozole toxicities: 1.Hot flashes 10-15 times per day associated with sweating previously Effexor did not work. She is currently on gabapentin for migraine prevention but it does not seem to reduce hot flashes. 2.myalgias arthralgias markedly better with letrozole.  Bone density done February 2019: T score -0.2 A.Fib on Eliquis Vaginal Dryness:  Breast cancer surveillance: 1. Breast exam  11/27/2017: No abnormalities of concern 2. Mammograms at [facility] 06/19/2018: Benign, breast density category 8  Return to clinic in1 yearfor follow-up.  I discussed the assessment and treatment plan with the patient. The patient was provided an opportunity to ask questions and all were answered. The patient agreed with the plan and demonstrated an understanding of the instructions. The patient was advised to call back or seek an in-person evaluation if the symptoms worsen or if the condition fails to improve as anticipated.   I provided 15 minutes of face-to-face MyChart video visit time during this encounter.    Rulon Eisenmenger, MD 11/26/2018   I, Molly Dorshimer, am acting as scribe for Nicholas Lose, MD.  I have reviewed the above documentation for accuracy and completeness, and I agree with the above.

## 2018-11-26 ENCOUNTER — Inpatient Hospital Stay: Payer: Medicare Other | Attending: Hematology and Oncology | Admitting: Hematology and Oncology

## 2018-11-26 DIAGNOSIS — Z170 Estrogen receptor positive status [ER+]: Secondary | ICD-10-CM | POA: Diagnosis not present

## 2018-11-26 DIAGNOSIS — C50511 Malignant neoplasm of lower-outer quadrant of right female breast: Secondary | ICD-10-CM | POA: Diagnosis not present

## 2018-11-26 DIAGNOSIS — Z923 Personal history of irradiation: Secondary | ICD-10-CM | POA: Diagnosis not present

## 2018-11-26 DIAGNOSIS — Z79811 Long term (current) use of aromatase inhibitors: Secondary | ICD-10-CM | POA: Diagnosis not present

## 2018-11-26 MED ORDER — LETROZOLE 2.5 MG PO TABS: 2.5000 mg | ORAL_TABLET | Freq: Every day | ORAL | 3 refills | Status: DC

## 2019-01-22 ENCOUNTER — Other Ambulatory Visit: Payer: Self-pay | Admitting: Cardiovascular Disease

## 2019-01-22 NOTE — Telephone Encounter (Signed)
SCR 0.7 @ [facility] [place]

## 2019-01-22 NOTE — Telephone Encounter (Signed)
Refill request

## 2019-06-13 ENCOUNTER — Ambulatory Visit: Payer: Medicare Other

## 2019-06-20 ENCOUNTER — Ambulatory Visit: Payer: Medicare Other

## 2019-06-24 ENCOUNTER — Ambulatory Visit: Payer: Medicare Other

## 2019-06-24 ENCOUNTER — Telehealth: Payer: Self-pay | Admitting: Cardiovascular Disease

## 2019-06-24 NOTE — Telephone Encounter (Signed)
Patient is calling to request a written letter of clearance, stating that she is eligible to receive the COVID-19 vaccination. Patient states that she is scheduled to receieve her first dosage of the COVID-19 vaccination on 01/26/20.

## 2019-06-24 NOTE — Telephone Encounter (Signed)
Contacted pt to confirm if clearance letter is regarding her anticoagulant. Pt confirmed. Informed pt that letter will be available at front desk for pick up. Pt states she plans on picking up letter tomorrow.  Signed letter placed at front desk paperwork pick area for pt.

## 2019-07-23 ENCOUNTER — Other Ambulatory Visit: Payer: Self-pay | Admitting: Cardiovascular Disease

## 2019-08-06 ENCOUNTER — Ambulatory Visit: Payer: Medicare Other | Admitting: Cardiovascular Disease

## 2019-08-07 ENCOUNTER — Other Ambulatory Visit: Payer: Self-pay

## 2019-08-07 ENCOUNTER — Ambulatory Visit: Payer: Medicare PPO | Admitting: Podiatry

## 2019-08-07 ENCOUNTER — Ambulatory Visit (INDEPENDENT_AMBULATORY_CARE_PROVIDER_SITE_OTHER): Payer: Medicare PPO

## 2019-08-07 DIAGNOSIS — M2012 Hallux valgus (acquired), left foot: Secondary | ICD-10-CM

## 2019-08-07 DIAGNOSIS — M2011 Hallux valgus (acquired), right foot: Secondary | ICD-10-CM | POA: Diagnosis not present

## 2019-08-07 DIAGNOSIS — L603 Nail dystrophy: Secondary | ICD-10-CM

## 2019-08-07 NOTE — Progress Notes (Signed)
Subjective:  Patient ID: Brandi Rodgers, female    DOB: [birth year],  MRN: [id number] HPI Chief Complaint  Patient presents with  . New Patient (Initial Visit)  . Foot Pain    bilateral bunion pain     69 y.o. female presents with the above complaint.   ROS: Denies fever chills nausea vomiting muscle aches pains calf pain back pain chest pain shortness of breath.  69 year old female presents with a several year duration of bilateral painful bunions.  States that is pretty much painful with shoe gear but not without shoe gear.  She is also concerned about discoloration of the hallux nail right foot.  States that should this nails had trauma in the past.  She states that bunion seem to run in the family.  Past Medical History:  Diagnosis Date  . Arthritis    cervical and lumbar spine  . Breast cancer of lower-outer quadrant of right female breast ([place]) 07/02/2015  . Degenerative lumbar disc   . Dental crowns present   . GERD (gastroesophageal reflux disease)   . Hypercholesterolemia    borderline - no current med.  . Jaw clicking   . Migraines   . Osteoarthritis    bilateral knee  . Paroxysmal atrial fibrillation ([place])    last episode was 01/2014  . Radiation 09/07/15-10/26/15   right breast 50.4 Gy, boost to 10 Gy  . Sleep apnea    uses mouth guard at night; no CPAP   Past Surgical History:  Procedure Laterality Date  . CARPAL TUNNEL RELEASE Right 05/13/2003  . DILATION AND CURETTAGE OF UTERUS    . KNEE ARTHROSCOPY Right 04/06/2003  . KNEE ARTHROSCOPY Left 11/30/2003  . RADIOACTIVE SEED GUIDED PARTIAL MASTECTOMY WITH AXILLARY SENTINEL LYMPH NODE BIOPSY Right 07/16/2015   Procedure: RADIOACTIVE SEED GUIDED PARTIAL MASTECTOMY WITH AXILLARY SENTINEL LYMPH NODE BIOPSY;  Surgeon: [name], MD;  Location: [facility];  Service: General;  Laterality: Right;  . TOTAL KNEE ARTHROPLASTY Left 03/23/2014   Procedure: LEFT TOTAL KNEE ARTHROPLASTY ;  Surgeon: [name], MD;  Location: [facility];  Service: Orthopedics;  Laterality: Left;  . TOTAL KNEE ARTHROPLASTY Right 08/21/2014   Procedure: RIGHT TOTAL KNEE ARTHROPLASTY;  Surgeon: Gaynelle Arabian, MD;  Location: [facility];  Service: Orthopedics;  Laterality: Right;    Current Outpatient Medications:  .  acetaminophen (TYLENOL) 325 MG tablet, , Disp: , Rfl:  .  atenolol (TENORMIN) 50 MG tablet, Take 50 mg by mouth daily. , Disp: , Rfl:  .  bismuth subsalicylate (PEPTO BISMOL) 262 MG chewable tablet, Chew 524 mg by mouth as needed., Disp: , Rfl:  .  Calcium-Vitamin D-Vitamin K (VIACTIV PO), Take 1 tablet by mouth daily., Disp: , Rfl:  .  ELIQUIS 5 MG TABS tablet, TAKE 1 TABLET BY MOUTH TWICE A DAY, Disp: 180 tablet, Rfl: 1 .  gabapentin (NEURONTIN) 600 MG tablet, Take 600 mg by mouth 3 (three) times daily., Disp: , Rfl:  .  letrozole (FEMARA) 2.5 MG tablet, Take 1 tablet (2.5 mg total) by mouth daily., Disp: 90 tablet, Rfl: 3 .  Multiple Vitamin (MULTIVITAMIN) tablet, Take 1 tablet by mouth daily., Disp: , Rfl:  .  rizatriptan (MAXALT-MLT) 10 MG disintegrating tablet, Take 10 mg by mouth as needed for migraine. Reported on 09/14/2015, Disp: , Rfl:   Allergies  Allergen Reactions  . Adhesive [Tape] Other (See Comments)    BLISTERS  . Methocarbamol Hives   Review of Systems Objective:  There  were no vitals filed for this visit.  General: Well developed, nourished, in no acute distress, alert and oriented x3   Dermatological: Skin is warm, dry and supple bilateral. Nails x 10 are well maintained; remaining integument appears unremarkable at this time. There are no open sores, no preulcerative lesions, no rash or signs of infection present.  Vascular: Dorsalis Pedis artery and Posterior Tibial artery pedal pulses are 2/4 bilateral with immedate capillary fill time. Pedal hair growth present. No varicosities and no lower extremity edema present bilateral.   Neruologic: Grossly intact via light touch  bilateral. Vibratory intact via tuning fork bilateral. Protective threshold with Semmes Wienstein monofilament intact to all pedal sites bilateral. Patellar and Achilles deep tendon reflexes 2+ bilateral. No Babinski or clonus noted bilateral.   Musculoskeletal: No gross boney pedal deformities bilateral. No pain, crepitus, or limitation noted with foot and ankle range of motion bilateral. Muscular strength 5/5 in all groups tested bilateral.  She has pain on palpation and range of motion of the first metatarsophalangeal joint.  She has an increase in the first intermetatarsal angle with hallux abductovalgus deformities.  Mildly rigid hammertoe deformity second right with contracted dorsal contracture at the level of the second metatarsophalangeal joint.  This is mildly tender on palpation left greater than right.  Gait: Unassisted, Nonantalgic.    Radiographs:  Radiographs taken today demonstrate increase in the first intermetatarsal angle greater than normal value with hallux abductus angle greater than normal value and early dislocation of the first metatarsal bilaterally left greater than right.  Hypertrophic medial condyle to the head of the first metatarsals noted bilaterally.  Hammertoe deformity is also noted and appears to be arthritic at the PIPJ second digits bilaterally left greater than right.  Assessment & Plan:   Assessment: Painful hallux abductovalgus deformity left greater than right.  Mild hammertoe deformity bilateral left greater than right.  Nail dystrophy hallux right.  Plan: Discussed the need for surgical correction would like to do so in the fall.  We will have her back for consult in the fall for discussion.  Also took samples of the nail today to be sent for pathologic evaluation and I will follow-up with her in 1 month for that.     [name], [place]

## 2019-08-12 ENCOUNTER — Ambulatory Visit: Payer: Medicare PPO | Admitting: Cardiovascular Disease

## 2019-08-12 ENCOUNTER — Encounter: Payer: Self-pay | Admitting: Cardiovascular Disease

## 2019-08-12 ENCOUNTER — Other Ambulatory Visit: Payer: Self-pay

## 2019-08-12 VITALS — BP 112/85 | HR 89 | Ht 68.0 in | Wt 228.0 lb

## 2019-08-12 DIAGNOSIS — Z7901 Long term (current) use of anticoagulants: Secondary | ICD-10-CM

## 2019-08-12 DIAGNOSIS — E668 Other obesity: Secondary | ICD-10-CM

## 2019-08-12 DIAGNOSIS — I2721 Secondary pulmonary arterial hypertension: Secondary | ICD-10-CM | POA: Diagnosis not present

## 2019-08-12 DIAGNOSIS — I4811 Longstanding persistent atrial fibrillation: Secondary | ICD-10-CM

## 2019-08-12 MED ORDER — APIXABAN 5 MG PO TABS: 5.0000 mg | ORAL_TABLET | Freq: Two times a day (BID) | ORAL | 3 refills | Status: DC

## 2019-08-12 NOTE — Patient Instructions (Signed)

## 2019-08-12 NOTE — Progress Notes (Signed)
Cardiology Office Note    Date:  08/13/2019   ID:  Brandi Rodgers, DOB 06-04-1950, MRN [id number]  PCP:  Jonathon Jordan, MD  Cardiologist:   Sanda Klein, MD   Chief complaint: Atrial fibrillation   History of Present Illness:  Brandi Rodgers is a 69 y.o. female with paroxysmal atrial fibrillation (question if now with persistent arrhythmia (, migraine headaches for which she takes atenolol , on anticoagulation with Eliquis without bleeding complications, morbid obesity, sleep apnea (uses a jaw advancement device).  She has had a pretty good year.  She has occasional palpitations that sometimes associate mild dyspnea or dizziness, occurring less than once a month.  They are never sustained.  She is in atrial fibrillation with a rate of 89 bpm today and is unaware of the arrhythmia.  She has labs scheduled Dr. Stephanie Acre in the near future.  She has managed to lose substantial weight since the last 12 months.  The patient specifically denies any chest pain at rest exertion, dyspnea at rest or with exertion, orthopnea, paroxysmal nocturnal dyspnea, syncope, focal neurological deficits, intermittent claudication, lower extremity edema, unexplained weight gain, cough, hemoptysis or wheezing.  She denies excessive daytime somnolence.  She denies any bleeding problems and is compliant with anticoagulation (Eliquis).  Her migraines are infrequent.  She has not had other focal neurological complaints.   Echocardiogram performed in October 2015 showed normal left ventricular wall thickness and ejection fraction, normal left atrial size, but did show mild pulmonary hypertension with an estimated systolic PA pressure 41 mmHg. Has a history of right breast cancer status post surgery and right breast radiation therapy. She does not have angina pectoris or any known coronary or peripheral vascular obstructive lesions. She has mild hyperlipidemia which has been managed with diet alone. She has had  problems with edema in the past, but uses furosemide extremely rarely.   Past Medical History:  Diagnosis Date  . Arthritis    cervical and lumbar spine  . Breast cancer of lower-outer quadrant of right female breast ([place]) 07/02/2015  . Degenerative lumbar disc   . Dental crowns present   . GERD (gastroesophageal reflux disease)   . Hypercholesterolemia    borderline - no current med.  . Jaw clicking   . Migraines   . Osteoarthritis    bilateral knee  . Paroxysmal atrial fibrillation ([place])    last episode was 01/2014  . Radiation 09/07/15-10/26/15   right breast 50.4 Gy, boost to 10 Gy  . Sleep apnea    uses mouth guard at night; no CPAP    Past Surgical History:  Procedure Laterality Date  . CARPAL TUNNEL RELEASE Right 05/13/2003  . DILATION AND CURETTAGE OF UTERUS    . KNEE ARTHROSCOPY Right 04/06/2003  . KNEE ARTHROSCOPY Left 11/30/2003  . RADIOACTIVE SEED GUIDED PARTIAL MASTECTOMY WITH AXILLARY SENTINEL LYMPH NODE BIOPSY Right 07/16/2015   Procedure: RADIOACTIVE SEED GUIDED PARTIAL MASTECTOMY WITH AXILLARY SENTINEL LYMPH NODE BIOPSY;  Surgeon: [name], MD;  Location: [facility];  Service: General;  Laterality: Right;  . TOTAL KNEE ARTHROPLASTY Left 03/23/2014   Procedure: LEFT TOTAL KNEE ARTHROPLASTY ;  Surgeon: [name], MD;  [facility];  Service: Orthopedics;  Laterality: Left;  . TOTAL KNEE ARTHROPLASTY Right 08/21/2014   Procedure: RIGHT TOTAL KNEE ARTHROPLASTY;  Surgeon: Gaynelle Arabian, MD;  [facility];  Service: Orthopedics;  Laterality: Right;    Current Medications: Outpatient Medications Prior to Visit  Medication Sig Dispense  Refill  . acetaminophen (TYLENOL) 325 MG tablet     . atenolol (TENORMIN) 50 MG tablet Take 50 mg by mouth daily.     [name] bismuth subsalicylate (PEPTO BISMOL) 262 MG chewable tablet Chew 524 mg by mouth as needed.    . Calcium-Vitamin D-Vitamin K (VIACTIV PO) Take 1 tablet by mouth daily.    [name] gabapentin  (NEURONTIN) 600 MG tablet Take 600 mg by mouth 3 (three) times daily.    [name] letrozole (FEMARA) 2.5 MG tablet Take 1 tablet (2.5 mg total) by mouth daily. 90 tablet 3  . Multiple Vitamin (MULTIVITAMIN) tablet Take 1 tablet by mouth daily.    . rizatriptan (MAXALT-MLT) 10 MG disintegrating tablet Take 10 mg by mouth as needed for migraine. Reported on 09/14/2015    . ELIQUIS 5 MG TABS tablet TAKE 1 TABLET BY MOUTH TWICE A DAY 180 tablet 1   No facility-administered medications prior to visit.     Allergies:   Adhesive [tape] and Methocarbamol   Social History   Socioeconomic History  . Marital status: Married    Spouse name: Not on file  . Number of children: Not on file  . Years of education: Not on file  . Highest education level: Not on file  Occupational History  . Not on file  Tobacco Use  . Smoking status: Never Smoker  . Smokeless tobacco: Never Used  Substance and Sexual Activity  . Alcohol use: Yes    Comment: occasionally  . Drug use: No  . Sexual activity: Yes  Other Topics Concern  . Not on file  Social History Narrative  . Not on file   Social Determinants of Health   Financial Resource Strain:   . Difficulty of Paying Living Expenses:   Food Insecurity:   . Worried About Charity fundraiser in the Last Year:   . Arboriculturist in the Last Year:   Transportation Needs:   . Film/video editor (Medical):   [name] Lack of Transportation (Non-Medical):   Physical Activity:   . Days of Exercise per Week:   . Minutes of Exercise per Session:   Stress:   . Feeling of Stress :   Social Connections:   . Frequency of Communication with Friends and Family:   . Frequency of Social Gatherings with Friends and Family:   . Attends Religious Services:   . Active Member of Clubs or Organizations:   . Attends Archivist Meetings:   [name] Marital Status:      Family History:  The patient's family history includes Lung cancer in her mother; Thyroid cancer in her  mother.   ROS:   Please see the history of present illness.    ROS all other systems are reviewed and are negative   PHYSICAL EXAM:   VS:  BP 112/85   Pulse 89   Ht 5\' 8"  (1.727 m)   Wt 228 lb (103.4 kg)   SpO2 98%   BMI 34.67 kg/m       General: Alert, oriented x3, no distress, moderately obese Head: no evidence of trauma, PERRL, EOMI, no exophtalmos or lid lag, no myxedema, no xanthelasma; normal ears, nose and oropharynx Neck: normal jugular venous pulsations and no hepatojugular reflux; brisk carotid pulses without delay and no carotid bruits Chest: clear to auscultation, no signs of consolidation by percussion or palpation, normal fremitus, symmetrical and full respiratory excursions Cardiovascular: normal position and quality of the apical impulse, regular rhythm, normal  first and second heart sounds, no murmurs, rubs or gallops Abdomen: no tenderness or distention, no masses by palpation, no abnormal pulsatility or arterial bruits, normal bowel sounds, no hepatosplenomegaly Extremities: no clubbing, cyanosis or edema; 2+ radial, ulnar and brachial pulses bilaterally; 2+ right femoral, posterior tibial and dorsalis pedis pulses; 2+ left femoral, posterior tibial and dorsalis pedis pulses; no subclavian or femoral bruits Neurological: grossly nonfocal Psych: Normal mood and affect    Wt Readings from Last 3 Encounters:  08/12/19 228 lb (103.4 kg)  07/18/18 255 lb (115.7 kg)  11/27/17 243 lb 11.2 oz (110.5 kg)      Studies/Labs Reviewed:   EKG:  EKG is ordered today.  It shows atrial fibrillation with controlled ventricular rate and is otherwise a normal tracing Recent Labs: No results found for requested labs within last 8760 hours.   01/25/2018 Hemoglobin 14.3, creatinine 0.7, potassium 4.2, normal liver function test, TSH 0.5  Lipid Panel  No results found for: CHOL, TRIG, HDL, CHOLHDL, VLDL, LDLCALC, LDLDIRECT   01/25/2018 Total cholesterol 181, HDL 52, LDL  106, triglycerides 116  ASSESSMENT:    1. Longstanding persistent atrial fibrillation ([place])   2. Long term current use of anticoagulant   3. Moderate obesity   4. PAH (pulmonary artery hypertension) ([facility])      PLAN:  In order of problems listed above:  1. AFib: Asymptomatic, well rate controlled.  It appears likely that she now has long-term persistent atrial fibrillation.  Cardioversion, antiarrhythmics, ablation are not justified.  She is on appropriate anticoagulation. 2. Eliquis: CHA2DS2-VASc score of 2 (age and gender).  Brief interruptions of anticoagulation for surgical procedures should be quite safe.  3. Moderate obesity: She has done a remarkable job of losing weight, even in the face of the coronavirus pandemic.  Congratulated her and encouraged her to keep up the good work. 4. PAH: Mild on previous echo.  No symptoms of dyspnea, no evidence of right heart failure.  Sleep apnea appears to be appropriately treated as judged by the absence of daytime hypersomnolence.It may be well worth repeating an echocardiogram in the near future, but will wait until the coronavirus pandemic is completely behind [place].    Medication Adjustments/Labs and Tests Ordered: Current medicines are reviewed at length with the patient today.  Concerns regarding medicines are outlined above.  Medication changes, Labs and Tests ordered today are listed in the Patient Instructions below. Patient Instructions  Medication Instructions:  No changes *If you need a refill on your cardiac medications before your next appointment, please call your pharmacy*   Lab Work: None ordered If you have labs (blood work) drawn today and your tests are completely normal, you will receive your results only by: [name] MyChart Message (if you have MyChart) OR . A paper copy in the mail If you have any lab test that is abnormal or we need to change your treatment, we will call you to review the  results.   Testing/Procedures: None ordered   Follow-Up: At [facility], you and your health needs are our priority.  As part of our continuing mission to provide you with exceptional heart care, we have created designated Provider Care Teams.  These Care Teams include your primary Cardiologist (physician) and Advanced Practice Providers (APPs -  Physician Assistants and Nurse Practitioners) who all work together to provide you with the care you need, when you need it.  We recommend signing up for the patient portal called "MyChart".  Sign up information is  provided on this After Visit Summary.  MyChart is used to connect with patients for Virtual Visits (Telemedicine).  Patients are able to view lab/test results, encounter notes, upcoming appointments, etc.  Non-urgent messages can be sent to your provider as well.   To learn more about what you can do with MyChart, go to [email or web address].    Your next appointment:   12 month(s)  The format for your next appointment:   In Person  Provider:   Sanda Klein, MD       Signed, Sanda Klein, MD  08/13/2019 1:00 PM    [address], [place], [place]  [address] Phone: [phone]; Fax: [phone]

## 2019-08-22 ENCOUNTER — Telehealth: Payer: Self-pay | Admitting: *Deleted

## 2019-08-22 MED ORDER — NEOMYCIN-POLYMYXIN-HC 1 % OT SOLN: OTIC | 2 refills | Status: DC

## 2019-08-22 NOTE — Telephone Encounter (Signed)
I spoke with pt and informed of results of fungal culture and I will have her follow up appt cancelled.

## 2019-08-22 NOTE — Telephone Encounter (Signed)
-----   Message from Garrel Ridgel, [place] sent at 08/21/2019  7:37 AM EDT ----- Neg for fungus.  Positive for bacteria.  Not necessary to follow up for this.  Rx her corticosporin otic solution.  Apply to free edge of nail twice daily and will take several months to grow out.  Provide her with two refills.

## 2019-08-22 NOTE — Telephone Encounter (Signed)
I informed pt of Dr. Hyatt's review of results and orders. 

## 2019-08-29 ENCOUNTER — Telehealth: Payer: Self-pay

## 2019-08-29 NOTE — Telephone Encounter (Signed)
   [facility] Pre-operative Risk Assessment    Request for surgical clearance:  1. What type of surgery is being performed? Cataract Extraction with intraocular lens implantation of the right eye/left eye followed by the right eye/left eye    2. When is this surgery scheduled? OD 09/30/19; OS 10/07/19   3. What type of clearance is required (medical clearance vs. Pharmacy clearance to hold med vs. Both)? BOTH  4. Are there any medications that need to be held prior to surgery and how long?  Eliquis  5. Practice name and name of physician performing surgery? [facility]    6. What is your office phone number [phone]    7.   What is your office fax number [phone]  8.   Anesthesia type (None, local, MAC, general) ? Unknown    Ena Dawley 08/29/2019, 2:32 PM  _________________________________________________________________   (provider comments below)

## 2019-09-01 NOTE — Telephone Encounter (Signed)
Do not need to hold anticoagulation for cataract procedure, recommend continuing Eliquis.

## 2019-09-08 NOTE — Progress Notes (Addendum)
[facility] Note  09/10/2019     CHIEF COMPLAINT Patient presents for Retina Evaluation   HISTORY OF PRESENT ILLNESS: Brandi Rodgers is a 69 y.o. female who presents to the clinic today for:   HPI    Retina Evaluation    In both eyes.  This started weeks ago.  Duration of weeks.  Context:  distance vision and near vision.  I, the attending physician,  performed the HPI with the patient and updated documentation appropriately.          Comments    Pt states her vision has been blurry in her right eye for a couple of years and is worsening.  Patient complains of blurry vision OS but not as blurry as OD.  Patient denies eye pain or discomfort and denies any new or worsening floaters or fol OU.       Last edited by Bernarda Caffey, MD on 09/10/2019  9:46 AM. (History)    pt is here on the referral of Dr. Lucita Ferrara for ERM OD, pt is planning on having cataract sx OD May 18 and OS May 25 with [facility], pt says she sees floaters, but denies eye trauma or sx other than having a tear duct opened up  Referring physician: Vevelyn Royals, MD No address on file  HISTORICAL INFORMATION:   Selected notes from the [facility] Referred by Dr. Vevelyn Royals for concern of ERM OD LEE:  Ocular Hx- PMH-   CURRENT MEDICATIONS: No current outpatient medications on file. (Ophthalmic Drugs)   No current facility-administered medications for this visit. (Ophthalmic Drugs)   Current Outpatient Medications (Other)  Medication Sig  . acetaminophen (TYLENOL) 325 MG tablet   . apixaban (ELIQUIS) 5 MG TABS tablet Take 1 tablet (5 mg total) by mouth 2 (two) times daily.  [name] atenolol (TENORMIN) 50 MG tablet Take 50 mg by mouth daily.   [name] bismuth subsalicylate (PEPTO BISMOL) 262 MG chewable tablet Chew 524 mg by mouth as needed.  . Calcium-Vitamin D-Vitamin K (VIACTIV PO) Take 1 tablet by mouth daily.  [name] gabapentin (NEURONTIN) 600 MG tablet Take 600 mg  by mouth 3 (three) times daily.  [name] letrozole (FEMARA) 2.5 MG tablet Take 1 tablet (2.5 mg total) by mouth daily.  . Multiple Vitamin (MULTIVITAMIN) tablet Take 1 tablet by mouth daily.  . NEOMYCIN-POLYMYXIN-HYDROCORTISONE (CORTISPORIN) 1 % SOLN OTIC solution *Apply to affected toenails 2 times daily.*  . rizatriptan (MAXALT-MLT) 10 MG disintegrating tablet Take 10 mg by mouth as needed for migraine. Reported on 09/14/2015   No current facility-administered medications for this visit. (Other)      REVIEW OF SYSTEMS: ROS    Positive for: Eyes   Negative for: Constitutional, Gastrointestinal, Neurological, Skin, Genitourinary, Musculoskeletal, HENT, Endocrine, Cardiovascular, Respiratory, Psychiatric, Allergic/Imm, Heme/Lymph   Last edited by Doneen Poisson on 09/10/2019  9:04 AM. (History)       ALLERGIES Allergies  Allergen Reactions  . Adhesive [Tape] Other (See Comments)    BLISTERS  . Methocarbamol Hives    PAST MEDICAL HISTORY Past Medical History:  Diagnosis Date  . Arthritis    cervical and lumbar spine  . Breast cancer of lower-outer quadrant of right female breast ([place]) 07/02/2015  . Degenerative lumbar disc   . Dental crowns present   . GERD (gastroesophageal reflux disease)   . Hypercholesterolemia    borderline - no current med.  . Jaw clicking   . Migraines   .  Osteoarthritis    bilateral knee  . Paroxysmal atrial fibrillation ([place])    last episode was 01/2014  . Radiation 09/07/15-10/26/15   right breast 50.4 Gy, boost to 10 Gy  . Sleep apnea    uses mouth guard at night; no CPAP   Past Surgical History:  Procedure Laterality Date  . CARPAL TUNNEL RELEASE Right 05/13/2003  . DILATION AND CURETTAGE OF UTERUS    . KNEE ARTHROSCOPY Right 04/06/2003  . KNEE ARTHROSCOPY Left 11/30/2003  . RADIOACTIVE SEED GUIDED PARTIAL MASTECTOMY WITH AXILLARY SENTINEL LYMPH NODE BIOPSY Right 07/16/2015   Procedure: RADIOACTIVE SEED GUIDED PARTIAL MASTECTOMY WITH AXILLARY  SENTINEL LYMPH NODE BIOPSY;  Surgeon: [name], MD;  Location: [facility];  Service: General;  Laterality: Right;  . TOTAL KNEE ARTHROPLASTY Left 03/23/2014   Procedure: LEFT TOTAL KNEE ARTHROPLASTY ;  Surgeon: [name], MD;  Location: [facility];  Service: Orthopedics;  Laterality: Left;  . TOTAL KNEE ARTHROPLASTY Right 08/21/2014   Procedure: RIGHT TOTAL KNEE ARTHROPLASTY;  Surgeon: Gaynelle Arabian, MD;  Location: [facility];  Service: Orthopedics;  Laterality: Right;    FAMILY HISTORY Family History  Problem Relation Age of Onset  . Lung cancer Mother   . Thyroid cancer Mother     SOCIAL HISTORY Social History   Tobacco Use  . Smoking status: Never Smoker  . Smokeless tobacco: Never Used  Substance Use Topics  . Alcohol use: Yes    Comment: occasionally  . Drug use: No         OPHTHALMIC EXAM:  Base Eye Exam    Visual Acuity (Snellen - Linear)      Right Left   Dist cc 20/50 20/20 -2   Dist ph cc NI    Correction: Contacts       Tonometry (Tonopen, 9:16 AM)      Right Left   Pressure 14 13       Pupils      Pupils Dark Light Shape React APD   Right PERRL 3 2 Round Brisk 0   Left PERRL 3 2 Round Brisk 0       Visual Fields      Left Right    Full Full       Extraocular Movement      Right Left    Full Full       Neuro/Psych    Oriented x3: Yes   Mood/Affect: Normal       Dilation    Both eyes: 1.0% Mydriacyl, 2.5% Phenylephrine @ 9:16 AM        Slit Lamp and Fundus Exam    Slit Lamp Exam      Right Left   Lids/Lashes Dermatochalasis - upper lid Dermatochalasis - upper lid   Conjunctiva/Sclera White and quiet mild temporal Pinguecula   Cornea 2+ central Punctate epithelial erosions, mild Arcus 1+ central Punctate epithelial erosions, mild Arcus   Anterior Chamber Deep and quiet Deep and quiet   Iris Round and dilated Round and dilated   Lens 2+ Nuclear sclerosis, 2-3+ Cortical cataract 1-2+ Nuclear sclerosis, 2+ Cortical  cataract   Vitreous Vitreous syneresis, Posterior vitreous detachment Vitreous syneresis       Fundus Exam      Right Left   Disc Pink and Sharp, mild temporal PPA Compact, Pink and Sharp, mild temporal PPA   C/D Ratio 0.5 0.6   Macula Flat, Blunted foveal reflex, Epiretinal membrane with early striae  Flat, Blunted foveal reflex,  trace ERM SN mac, No heme or edema   Vessels Vascular attenuation, Tortuous Mild Vascular attenuation   Periphery Attached, reticular degeneration, paving stone degeneration inferior and temporal periphery, No RT/RD, No heme  Attached, reticular degeneration, paving stone degeneration inferior and temporal periphery, No RT/RD, No heme         Refraction    Wearing Rx      Sphere   Right CL   Left CL  OD: -1.50 OS: -2.00       Manifest Refraction      Sphere Cylinder Axis Dist VA   Right -2.00 Sphere  20/50   Left -2.25 +0.50 015 20/20          IMAGING AND PROCEDURES  Imaging and Procedures for _0 @  OCT, Retina - OU - Both Eyes       Right Eye Quality was good. Central Foveal Thickness: 379. Progression has no prior data. Findings include abnormal foveal contour, no IRF, no SRF, epiretinal membrane, macular pucker.   Left Eye Quality was good. Central Foveal Thickness: 268. Progression has no prior data. Findings include normal foveal contour, no IRF, no SRF (Trace ERM superiorly).   Notes *Images captured and stored on drive  Diagnosis / Impression:  OD: +ERM with pucker OS: NFP, no IRF/SRF  Clinical management:  See below  Abbreviations: NFP - Normal foveal profile. CME - cystoid macular edema. PED - pigment epithelial detachment. IRF - intraretinal fluid. SRF - subretinal fluid. EZ - ellipsoid zone. ERM - epiretinal membrane. ORA - outer retinal atrophy. ORT - outer retinal tubulation. SRHM - subretinal hyper-reflective material                 ASSESSMENT/PLAN:    ICD-10-CM   1. Epiretinal membrane (ERM) of right  eye  H35.371   2. Retinal edema  H35.81 OCT, Retina - OU - Both Eyes  3. Essential hypertension  I10   4. Hypertensive retinopathy of both eyes  H35.033   5. Combined forms of age-related cataract of both eyes  H25.813     1,2. Epiretinal membrane, OD  - The natural history, anatomy, potential for loss of vision, and treatment options including vitrectomy techniques and the complications of endophthalmitis, retinal detachment, vitreous hemorrhage, cataract progression and permanent vision loss discussed with the patient.  - mild ERM  - asymptomatic, no metamorphopsia  - no indication for surgery at this time  - monitor for now  - clear from a retina standpoint to proceed with cataract surgery when pt and surgeon are ready  - f/u around June 16 or after  3,4. Hypertensive retinopathy OU  - discussed importance of tight BP control  - monitor  5. Mixed Cataract OU  - under the expert care of Dr. Lucita Ferrara  - clear from a retina standpoint to proceed with cataract surgery when pt and surgeon are ready  - surgery scheduled with Dr. Lucita Ferrara (OD: 05.18.21, OS: 05.25.21)    Ophthalmic Meds Ordered this visit:  No orders of the defined types were placed in this encounter.      Return for f/u around June 16, ERM OD, DFE, OCT.  There are no Patient Instructions on file for this visit.   Explained the diagnoses, plan, and follow up with the patient and they expressed understanding.  Patient expressed understanding of the importance of proper follow up care.   Gardiner Sleeper, M.D., Ph.D. Diseases & Surgery of the Retina and [place]  I have reviewed the above documentation for accuracy and completeness, and I agree with the above. Gardiner Sleeper, M.D., Ph.D. 09/12/19 12:47 AM     Abbreviations: M myopia (nearsighted); A astigmatism; H hyperopia (farsighted); P presbyopia; Mrx spectacle prescription;  CTL contact lenses; OD right eye; OS  left eye; OU both eyes  XT exotropia; ET esotropia; PEK punctate epithelial keratitis; PEE punctate epithelial erosions; DES dry eye syndrome; MGD meibomian gland dysfunction; ATs artificial tears; PFAT's preservative free artificial tears; [place] nuclear sclerotic cataract; PSC posterior subcapsular cataract; ERM epi-retinal membrane; PVD posterior vitreous detachment; RD retinal detachment; DM diabetes mellitus; DR diabetic retinopathy; NPDR non-proliferative diabetic retinopathy; PDR proliferative diabetic retinopathy; CSME clinically significant macular edema; DME diabetic macular edema; dbh dot blot hemorrhages; CWS cotton wool spot; POAG primary open angle glaucoma; C/D cup-to-disc ratio; HVF humphrey visual field; GVF goldmann visual field; OCT optical coherence tomography; IOP intraocular pressure; BRVO Branch retinal vein occlusion; CRVO central retinal vein occlusion; CRAO central retinal artery occlusion; BRAO branch retinal artery occlusion; RT retinal tear; SB scleral buckle; PPV pars plana vitrectomy; VH Vitreous hemorrhage; PRP panretinal laser photocoagulation; IVK intravitreal kenalog; VMT vitreomacular traction; MH Macular hole;  NVD neovascularization of the disc; NVE neovascularization elsewhere; AREDS age related eye disease study; ARMD age related macular degeneration; POAG primary open angle glaucoma; EBMD epithelial/anterior basement membrane dystrophy; ACIOL anterior chamber intraocular lens; IOL intraocular lens; PCIOL posterior chamber intraocular lens; Phaco/IOL phacoemulsification with intraocular lens placement; [place] photorefractive keratectomy; LASIK laser assisted in situ keratomileusis; HTN hypertension; DM diabetes mellitus; COPD chronic obstructive pulmonary disease

## 2019-09-09 ENCOUNTER — Ambulatory Visit: Payer: Medicare PPO | Admitting: Podiatry

## 2019-09-10 ENCOUNTER — Encounter (INDEPENDENT_AMBULATORY_CARE_PROVIDER_SITE_OTHER): Payer: Self-pay | Admitting: Ophthalmology

## 2019-09-10 ENCOUNTER — Ambulatory Visit (INDEPENDENT_AMBULATORY_CARE_PROVIDER_SITE_OTHER): Payer: Medicare PPO | Admitting: Ophthalmology

## 2019-09-10 ENCOUNTER — Other Ambulatory Visit: Payer: Self-pay

## 2019-09-10 DIAGNOSIS — I10 Essential (primary) hypertension: Secondary | ICD-10-CM

## 2019-09-10 DIAGNOSIS — H35033 Hypertensive retinopathy, bilateral: Secondary | ICD-10-CM

## 2019-09-10 DIAGNOSIS — H25813 Combined forms of age-related cataract, bilateral: Secondary | ICD-10-CM

## 2019-09-10 DIAGNOSIS — H35371 Puckering of macula, right eye: Secondary | ICD-10-CM

## 2019-09-10 DIAGNOSIS — H3581 Retinal edema: Secondary | ICD-10-CM | POA: Diagnosis not present

## 2019-09-11 ENCOUNTER — Encounter: Payer: Self-pay | Admitting: Radiology

## 2019-09-11 ENCOUNTER — Encounter: Payer: Self-pay | Admitting: Hematology and Oncology

## 2019-10-23 ENCOUNTER — Other Ambulatory Visit: Payer: Self-pay | Admitting: Hematology and Oncology

## 2019-10-24 ENCOUNTER — Telehealth: Payer: Self-pay | Admitting: Hematology and Oncology

## 2019-10-24 NOTE — Telephone Encounter (Signed)
Scheduled appt per 6/11 sch message - pt is aware of appt .

## 2019-10-28 NOTE — Progress Notes (Signed)
[facility] Note  10/29/2019     CHIEF COMPLAINT Patient presents for Retina Follow Up   HISTORY OF PRESENT ILLNESS: Brandi Rodgers is a 69 y.o. female who presents to the clinic today for:   HPI    Retina Follow Up    Patient presents with  Other.  In right eye.  This started 7 weeks ago.  Severity is moderate.  I, the attending physician,  performed the HPI with the patient and updated documentation appropriately.          Comments    Patient here for 7 weeks retina follow up for ERM OD. Patient states vision doing good. Had cat surgery OU. Vision not perfect but a whole lot better. No eye pain. Down to one drop OS. Will be done next week.        Last edited by Bernarda Caffey, MD on 10/29/2019  1:24 PM. (History)    Patient states had CE with IOL OU. Vision has improved in both eyes since surgery. Patient notices occasional flash on right side with new, small floater.  Referring physician: Vevelyn Royals, MD No address on file  HISTORICAL INFORMATION:   Selected notes from the MEDICAL RECORD NUMBER Referred by Dr. Vevelyn Royals for concern of ERM OD LEE:  Ocular Hx- PMH-   CURRENT MEDICATIONS: No current outpatient medications on file. (Ophthalmic Drugs)   No current facility-administered medications for this visit. (Ophthalmic Drugs)   Current Outpatient Medications (Other)  Medication Sig  . acetaminophen (TYLENOL) 325 MG tablet   . apixaban (ELIQUIS) 5 MG TABS tablet Take 1 tablet (5 mg total) by mouth 2 (two) times daily.  [name] atenolol (TENORMIN) 50 MG tablet Take 50 mg by mouth daily.   [name] bismuth subsalicylate (PEPTO BISMOL) 262 MG chewable tablet Chew 524 mg by mouth as needed.  . Calcium-Vitamin D-Vitamin K (VIACTIV PO) Take 1 tablet by mouth daily.  [name] gabapentin (NEURONTIN) 600 MG tablet Take 600 mg by mouth 3 (three) times daily.  [name] letrozole (FEMARA) 2.5 MG tablet TAKE 1 TABLET BY MOUTH EVERY DAY  . Multiple Vitamin  (MULTIVITAMIN) tablet Take 1 tablet by mouth daily.  . NEOMYCIN-POLYMYXIN-HYDROCORTISONE (CORTISPORIN) 1 % SOLN OTIC solution *Apply to affected toenails 2 times daily.*  . rizatriptan (MAXALT-MLT) 10 MG disintegrating tablet Take 10 mg by mouth as needed for migraine. Reported on 09/14/2015   No current facility-administered medications for this visit. (Other)      REVIEW OF SYSTEMS: ROS    Positive for: Eyes   Negative for: Constitutional, Gastrointestinal, Neurological, Skin, Genitourinary, Musculoskeletal, HENT, Endocrine, Cardiovascular, Respiratory, Psychiatric, Allergic/Imm, Heme/Lymph   Last edited by Theodore Demark, COA on 10/29/2019  9:29 AM. (History)       ALLERGIES Allergies  Allergen Reactions  . Adhesive [Tape] Other (See Comments)    BLISTERS  . Methocarbamol Hives    PAST MEDICAL HISTORY Past Medical History:  Diagnosis Date  . Arthritis    cervical and lumbar spine  . Breast cancer of lower-outer quadrant of right female breast ([place]) 07/02/2015  . Degenerative lumbar disc   . Dental crowns present   . GERD (gastroesophageal reflux disease)   . Hypercholesterolemia    borderline - no current med.  . Jaw clicking   . Migraines   . Osteoarthritis    bilateral knee  . Paroxysmal atrial fibrillation ([place])    last episode was 01/2014  . Radiation 09/07/15-10/26/15   right breast 50.4  Gy, boost to 10 Gy  . Sleep apnea    uses mouth guard at night; no CPAP   Past Surgical History:  Procedure Laterality Date  . CARPAL TUNNEL RELEASE Right 05/13/2003  . DILATION AND CURETTAGE OF UTERUS    . KNEE ARTHROSCOPY Right 04/06/2003  . KNEE ARTHROSCOPY Left 11/30/2003  . RADIOACTIVE SEED GUIDED PARTIAL MASTECTOMY WITH AXILLARY SENTINEL LYMPH NODE BIOPSY Right 07/16/2015   Procedure: RADIOACTIVE SEED GUIDED PARTIAL MASTECTOMY WITH AXILLARY SENTINEL LYMPH NODE BIOPSY;  Surgeon: [name], MD;  Location: [facility];  Service: General;  Laterality:  Right;  . TOTAL KNEE ARTHROPLASTY Left 03/23/2014   Procedure: LEFT TOTAL KNEE ARTHROPLASTY ;  Surgeon: [name], MD;  [facility]: [facility];  Service: Orthopedics;  Laterality: Left;  . TOTAL KNEE ARTHROPLASTY Right 08/21/2014   Procedure: RIGHT TOTAL KNEE ARTHROPLASTY;  Surgeon: Gaynelle Arabian, MD;  [facility];  Service: Orthopedics;  Laterality: Right;    FAMILY HISTORY Family History  Problem Relation Age of Onset  . Lung cancer Mother   . Thyroid cancer Mother     SOCIAL HISTORY Social History   Tobacco Use  . Smoking status: Never Smoker  . Smokeless tobacco: Never Used  Substance Use Topics  . Alcohol use: Yes    Comment: occasionally  . Drug use: No         OPHTHALMIC EXAM:  Base Eye Exam    Visual Acuity (Snellen - Linear)      Right Left   Dist [place] 20/30 20/20   Dist ph [place] NI        Tonometry (Tonopen, 9:25 AM)      Right Left   Pressure 13 11       Pupils      Dark Light Shape React APD   Right 3 2 Round Brisk None   Left 3 2 Round Brisk None       Visual Fields (Counting fingers)      Left Right    Full Full       Extraocular Movement      Right Left    Full, Ortho Full, Ortho       Neuro/Psych    Oriented x3: Yes   Mood/Affect: Normal       Dilation    Both eyes: 1.0% Mydriacyl, 2.5% Phenylephrine @ 9:25 AM        Slit Lamp and Fundus Exam    Slit Lamp Exam      Right Left   Lids/Lashes Dermatochalasis - upper lid Dermatochalasis - upper lid   Conjunctiva/Sclera White and quiet mild temporal Pinguecula   Cornea trace Punctate epithelial erosions, mild Arcus, well healed cataract wound 1-2+ central Punctate epithelial erosions, mild Arcus, well healed cataract wound   Anterior Chamber Deep, 1/2+ cell/pigment Deep and clear, no cell or flare   Iris Round and dilated Round and dilated   Lens PC IOL in perfect position, trace PCO with PC folds PC IOL in excellent postion, trace PCO  with PC folds   Vitreous Vitreous syneresis,  Posterior vitreous detachment, mild , Asteroid hyalosis inferiorly Vitreous syneresis       Fundus Exam      Right Left   Disc Pink and Sharp, mild temporal PPA Compact, Pink and Sharp, mild temporal PPA   C/D Ratio 0.5 0.6   Macula Flat, Blunted foveal reflex, Epiretinal membrane with early striae--stable , Hemorrhage Flat, Blunted foveal reflex, trace ERM SN mac, No heme  or edema   Vessels Vascular attenuation, Tortuous Mild Vascular attenuation   Periphery Attached, reticular degeneration, paving stone degeneration inferior and temporal periphery, No RT/RD, No heme  Attached, reticular degeneration, paving stone degeneration inferior and temporal periphery, No RT/RD, No heme           IMAGING AND PROCEDURES  Imaging and Procedures for _0 @  OCT, Retina - OU - Both Eyes       Right Eye Quality was good. Central Foveal Thickness: 384. Progression has been stable. Findings include abnormal foveal contour, no IRF, no SRF, epiretinal membrane, macular pucker.   Left Eye Quality was borderline. Central Foveal Thickness: 281. Progression has been stable. Findings include normal foveal contour, no IRF, no SRF (Trace ERM superiorly).   Notes *Images captured and stored on drive  Diagnosis / Impression:  OD: +ERM with pucker--stable from prior OS: NFP, no IRF/SRF--stable  Clinical management:  See below  Abbreviations: NFP - Normal foveal profile. CME - cystoid macular edema. PED - pigment epithelial detachment. IRF - intraretinal fluid. SRF - subretinal fluid. EZ - ellipsoid zone. ERM - epiretinal membrane. ORA - outer retinal atrophy. ORT - outer retinal tubulation. SRHM - subretinal hyper-reflective material                 ASSESSMENT/PLAN:    ICD-10-CM   1. Epiretinal membrane (ERM) of right eye  H35.371   2. Retinal edema  H35.81 OCT, Retina - OU - Both Eyes  3. Essential hypertension  I10   4. Hypertensive retinopathy of both eyes  H35.033   5. Pseudophakia  of both eyes  Z96.1     1,2. Epiretinal membrane, OD  - The natural history, anatomy, potential for loss of vision, and treatment options including vitrectomy techniques and the complications of endophthalmitis, retinal detachment, vitreous hemorrhage, cataract progression and permanent vision loss discussed with the patient.  - mild ERM--stable from prior  - asymptomatic, no metamorphopsia  - no indication for surgery at this time  - monitor for now  - had CE with IOL OU w/ [facility] (OD: 05.18.21, OS: 05.25.21), vision improved  - f/u 4 months  3,4. Hypertensive retinopathy OU  - discussed importance of tight BP control  - monitor  5. Pseudophakia OU  - s/p CE/IOL OU (OD: 05.18.21, OS: 05.25.21, [facility])  - beautiful surgeries, doing well  - monitor   Ophthalmic Meds Ordered this visit:  No orders of the defined types were placed in this encounter.      Return 4 months, for DFE, OCT.  There are no Patient Instructions on file for this visit.   Explained the diagnoses, plan, and follow up with the patient and they expressed understanding.  Patient expressed understanding of the importance of proper follow up care.   This document serves as a record of services personally performed by Gardiner Sleeper, MD, PhD. It was created on their behalf by Roselee Nova, COMT. The creation of this record is the provider's dictation and/or activities during the visit.  Electronically signed by: Roselee Nova, COMT 10/29/19 1:26 PM  Gardiner Sleeper, M.D., Ph.D. Diseases & Surgery of the Retina and [place] 10/29/2019   I have reviewed the above documentation for accuracy and completeness, and I agree with the above. Gardiner Sleeper, M.D., Ph.D. 10/29/19 1:29 PM   Abbreviations: M myopia (nearsighted); A astigmatism; H hyperopia (farsighted); P presbyopia; Mrx spectacle prescription;  CTL contact lenses; OD right eye; OS left  eye; OU both eyes   XT exotropia; ET esotropia; PEK punctate epithelial keratitis; PEE punctate epithelial erosions; DES dry eye syndrome; MGD meibomian gland dysfunction; ATs artificial tears; PFAT's preservative free artificial tears; [place] nuclear sclerotic cataract; PSC posterior subcapsular cataract; ERM epi-retinal membrane; PVD posterior vitreous detachment; RD retinal detachment; DM diabetes mellitus; DR diabetic retinopathy; NPDR non-proliferative diabetic retinopathy; PDR proliferative diabetic retinopathy; CSME clinically significant macular edema; DME diabetic macular edema; dbh dot blot hemorrhages; CWS cotton wool spot; POAG primary open angle glaucoma; C/D cup-to-disc ratio; HVF humphrey visual field; GVF goldmann visual field; OCT optical coherence tomography; IOP intraocular pressure; BRVO Branch retinal vein occlusion; CRVO central retinal vein occlusion; CRAO central retinal artery occlusion; BRAO branch retinal artery occlusion; RT retinal tear; SB scleral buckle; PPV pars plana vitrectomy; VH Vitreous hemorrhage; PRP panretinal laser photocoagulation; IVK intravitreal kenalog; VMT vitreomacular traction; MH Macular hole;  NVD neovascularization of the disc; NVE neovascularization elsewhere; AREDS age related eye disease study; ARMD age related macular degeneration; POAG primary open angle glaucoma; EBMD epithelial/anterior basement membrane dystrophy; ACIOL anterior chamber intraocular lens; IOL intraocular lens; PCIOL posterior chamber intraocular lens; Phaco/IOL phacoemulsification with intraocular lens placement; [place] photorefractive keratectomy; LASIK laser assisted in situ keratomileusis; HTN hypertension; DM diabetes mellitus; COPD chronic obstructive pulmonary disease

## 2019-10-29 ENCOUNTER — Other Ambulatory Visit: Payer: Self-pay

## 2019-10-29 ENCOUNTER — Ambulatory Visit (INDEPENDENT_AMBULATORY_CARE_PROVIDER_SITE_OTHER): Payer: Medicare PPO | Admitting: Ophthalmology

## 2019-10-29 ENCOUNTER — Encounter (INDEPENDENT_AMBULATORY_CARE_PROVIDER_SITE_OTHER): Payer: Self-pay | Admitting: Ophthalmology

## 2019-10-29 DIAGNOSIS — H35033 Hypertensive retinopathy, bilateral: Secondary | ICD-10-CM

## 2019-10-29 DIAGNOSIS — Z961 Presence of intraocular lens: Secondary | ICD-10-CM

## 2019-10-29 DIAGNOSIS — I10 Essential (primary) hypertension: Secondary | ICD-10-CM | POA: Diagnosis not present

## 2019-10-29 DIAGNOSIS — H35371 Puckering of macula, right eye: Secondary | ICD-10-CM

## 2019-10-29 DIAGNOSIS — H3581 Retinal edema: Secondary | ICD-10-CM | POA: Diagnosis not present

## 2019-12-02 ENCOUNTER — Encounter: Payer: Self-pay | Admitting: Hematology and Oncology

## 2019-12-07 NOTE — Progress Notes (Signed)
Patient Care Team: Jonathon Jordan, MD as PCP - General (Family Medicine) Croitoru, Dani Gobble, MD as PCP - Cardiology (Cardiology) Sylvan Cheese, NP as Nurse Practitioner (Hematology and Oncology)  DIAGNOSIS:    ICD-10-CM   1. Malignant neoplasm of lower-outer quadrant of right breast of female, estrogen receptor positive ([place])  C50.511    Z17.0     SUMMARY OF ONCOLOGIC HISTORY: Oncology History  Breast cancer of lower-outer quadrant of right female breast ([place])  06/25/2015 Initial Diagnosis   Right breast screening det focal asymmetry plus calcs 4 mm at 8:00 axilla neg, 3-D biopsy: Grade 1 IDC with DCIS and calcifications, ER 95%, PR 90% HER-2 negative ratio 1.70, KI 67:15%, clinical stage: T1aN0 Stage 1A    07/16/2015 Surgery   Rt Lumpectomy [facility]): IDC grade 2, 0.6 cm, with DCIS, 0/4 LN, ER 95%, PR 90% HER-2 negative ratio 1.70, KI 67:15%, Path stage: T1aN0 Stage 1A , Oncotype DX score 19, 12% ROR   09/07/2015 - 10/26/2015 Radiation Therapy   Adjuvant radiation therapy ([place]). Right breast treated to 50.4 Gy in 28 fractions.  Right breast boost treated to 10 Gy in 5 fractions    11/01/2015 -  Anti-estrogen oral therapy   Anastrozole 1 mg daily stopped March 2018 due to severe hot flashes switched to letrozole 08/28/2016 switched to exemestane 11/27/2017     CHIEF COMPLIANT: Follow-up of right breast cancer on letrozole therapy  INTERVAL HISTORY: Brandi Rodgers is a 69 y.o. with above-mentioned history of right breast cancer treated with lumpectomy, radiation, and who is currently on anti-estrogen therapy with letrozole. Mammogram on 09/11/19 showed no evidence of malignancy bilaterally. She presents to the clinic today for follow-up.   ALLERGIES:  is allergic to adhesive [tape] and methocarbamol.  MEDICATIONS:  Current Outpatient Medications  Medication Sig Dispense Refill  . acetaminophen (TYLENOL) 325 MG tablet     . apixaban (ELIQUIS) 5 MG TABS tablet Take 1  tablet (5 mg total) by mouth 2 (two) times daily. 180 tablet 3  . atenolol (TENORMIN) 50 MG tablet Take 50 mg by mouth daily.     [name] bismuth subsalicylate (PEPTO BISMOL) 262 MG chewable tablet Chew 524 mg by mouth as needed.    . Calcium-Vitamin D-Vitamin K (VIACTIV PO) Take 1 tablet by mouth daily.    [name] gabapentin (NEURONTIN) 600 MG tablet Take 600 mg by mouth 3 (three) times daily.    [name] letrozole (FEMARA) 2.5 MG tablet TAKE 1 TABLET BY MOUTH EVERY DAY 90 tablet 0  . Multiple Vitamin (MULTIVITAMIN) tablet Take 1 tablet by mouth daily.    . NEOMYCIN-POLYMYXIN-HYDROCORTISONE (CORTISPORIN) 1 % SOLN OTIC solution *Apply to affected toenails 2 times daily.* 10 mL 2  . rizatriptan (MAXALT-MLT) 10 MG disintegrating tablet Take 10 mg by mouth as needed for migraine. Reported on 09/14/2015     No current facility-administered medications for this visit.    PHYSICAL EXAMINATION: ECOG PERFORMANCE STATUS: 1 - Symptomatic but completely ambulatory  Vitals:   12/08/19 1139  BP: 128/78  Pulse: 86  Resp: 17  Temp: 98.3 F (36.8 C)  SpO2: 94%   Filed Weights   12/08/19 1139  Weight: (!) 261 lb 4.8 oz (118.5 kg)    BREAST: No palpable masses or nodules in either right or left breasts. No palpable axillary supraclavicular or infraclavicular adenopathy no breast tenderness or nipple discharge. (exam performed in the presence of a chaperone)  LABORATORY DATA:  I have reviewed the data as listed CMP Latest  Ref Rng & Units 12/27/2015 07/07/2015 08/23/2014  Glucose 65 - 99 mg/dL 98 91 136(H)  BUN 7 - 25 mg/dL 9 10.9 11  Creatinine 0.50 - 0.99 mg/dL 0.83 0.8 0.57  Sodium 135 - 146 mmol/L 141 142 138  Potassium 3.5 - 5.3 mmol/L 4.1 4.0 4.6  Chloride 98 - 110 mmol/L 102 - 103  CO2 20 - 31 mmol/L 32(H) 29 30  Calcium 8.6 - 10.4 mg/dL 9.6 10.0 8.8  Total Protein 6.4 - 8.3 g/dL - 7.1 -  Total Bilirubin 0.20 - 1.20 mg/dL - 0.52 -  Alkaline Phos 40 - 150 U/L - 127 -  AST 5 - 34 U/L - 22 -  ALT 0 - 55 U/L -  18 -    Lab Results  Component Value Date   WBC 8.3 12/27/2015   HGB 13.2 12/27/2015   HCT 39.9 12/27/2015   MCV 86.7 12/27/2015   PLT 229 12/27/2015   NEUTROABS 6.6 (H) 07/07/2015    ASSESSMENT & PLAN:  Breast cancer of lower-outer quadrant of right female breast ([place]) Rt Lumpectomy 07/16/15: IDC grade 2, 0.6 cm, with DCIS, 0/4 LN, ER 95%, PR 90% HER-2 negative ratio 1.70, KI 67:15%, Path stage: T1aN0 Stage 1A Oncotype DX score 19, 12% risk of recurrence Status post radiation 09/08/2015 to 10/26/2015  Current treatment: Anastrozole 1 mg daily 5 years started 11/01/2015 stopped March 2018, switched to letrozole 04/16/2018because of continuing hot flashes I sent a prescription for exemestane (did not make a difference).  she went  back to letrozole.  Letrozole toxicities: 1.Hot flashes 10-15 times per day associated with sweating previously Effexor did not work. She is currently on gabapentin for migraine prevention but it does not seem to reduce hot flashes. 2.myalgias arthralgias markedly better with letrozole.  Bone density done February 2019: T score -0.2 A.Fib on Eliquis Vaginal Dryness:  Breast cancer surveillance: 1.Breast exam 12/08/2019: No abnormalities of concern 2.Mammograms at [facility]  09/11/2019: Benign, breast density category A  Return to clinic in1 yearfor follow-up.    No orders of the defined types were placed in this encounter.  The patient has a good understanding of the overall plan. she agrees with it. she will call with any problems that may develop before the next visit here.  Total time spent: 20 mins including face to face time and time spent for planning, charting and coordination of care  Nicholas Lose, MD 12/08/2019  I, Cloyde Reams Dorshimer, am acting as scribe for Dr. Nicholas Lose.  I have reviewed the above documentation for accuracy and completeness, and I agree with the above.

## 2019-12-08 ENCOUNTER — Telehealth: Payer: Self-pay | Admitting: Hematology and Oncology

## 2019-12-08 ENCOUNTER — Inpatient Hospital Stay: Payer: Medicare PPO | Attending: Hematology and Oncology | Admitting: Hematology and Oncology

## 2019-12-08 ENCOUNTER — Other Ambulatory Visit: Payer: Self-pay

## 2019-12-08 DIAGNOSIS — Z888 Allergy status to other drugs, medicaments and biological substances status: Secondary | ICD-10-CM | POA: Diagnosis not present

## 2019-12-08 DIAGNOSIS — M2550 Pain in unspecified joint: Secondary | ICD-10-CM | POA: Diagnosis not present

## 2019-12-08 DIAGNOSIS — C50511 Malignant neoplasm of lower-outer quadrant of right female breast: Secondary | ICD-10-CM | POA: Insufficient documentation

## 2019-12-08 DIAGNOSIS — Z923 Personal history of irradiation: Secondary | ICD-10-CM | POA: Diagnosis not present

## 2019-12-08 DIAGNOSIS — Z7901 Long term (current) use of anticoagulants: Secondary | ICD-10-CM | POA: Insufficient documentation

## 2019-12-08 DIAGNOSIS — Z79899 Other long term (current) drug therapy: Secondary | ICD-10-CM | POA: Diagnosis not present

## 2019-12-08 DIAGNOSIS — Z170 Estrogen receptor positive status [ER+]: Secondary | ICD-10-CM | POA: Insufficient documentation

## 2019-12-08 DIAGNOSIS — M791 Myalgia, unspecified site: Secondary | ICD-10-CM | POA: Diagnosis not present

## 2019-12-08 DIAGNOSIS — I4891 Unspecified atrial fibrillation: Secondary | ICD-10-CM | POA: Diagnosis not present

## 2019-12-08 DIAGNOSIS — Z79811 Long term (current) use of aromatase inhibitors: Secondary | ICD-10-CM | POA: Insufficient documentation

## 2019-12-08 MED ORDER — LETROZOLE 2.5 MG PO TABS: 2.5000 mg | ORAL_TABLET | Freq: Every day | ORAL | 3 refills | Status: DC

## 2019-12-08 MED ORDER — GABAPENTIN 600 MG PO TABS: 600.0000 mg | ORAL_TABLET | Freq: Two times a day (BID) | ORAL | Status: DC

## 2019-12-08 NOTE — Assessment & Plan Note (Signed)
Rt Lumpectomy 07/16/15: IDC grade 2, 0.6 cm, with DCIS, 0/4 LN, ER 95%, PR 90% HER-2 negative ratio 1.70, KI 67:15%, Path stage: T1aN0 Stage 1A Oncotype DX score 19, 12% risk of recurrence Status post radiation 09/08/2015 to 10/26/2015  Current treatment: Anastrozole 1 mg daily 5 years started 11/01/2015 stopped March 2018, switched to letrozole 04/16/2018because of continuing hot flashes I sent a prescription for exemestane (did not make a difference).  she went  back to letrozole.  Letrozole toxicities: 1.Hot flashes 10-15 times per day associated with sweating previously Effexor did not work. She is currently on gabapentin for migraine prevention but it does not seem to reduce hot flashes. 2.myalgias arthralgias markedly better with letrozole.  Bone density done February 2019: T score -0.2 A.Fib on Eliquis Vaginal Dryness:  Breast cancer surveillance: 1.Breast exam 12/08/2019: No abnormalities of concern 2.Mammograms at [facility]  09/11/2019: Benign, breast density category A  Return to clinic in1 yearfor follow-up.

## 2019-12-08 NOTE — Telephone Encounter (Signed)
Scheduled appts per 7/26 los. Gave pt a print out of AVS.

## 2019-12-16 DIAGNOSIS — Z79899 Other long term (current) drug therapy: Secondary | ICD-10-CM | POA: Diagnosis not present

## 2019-12-16 DIAGNOSIS — L080 Pyoderma: Secondary | ICD-10-CM | POA: Diagnosis not present

## 2019-12-16 DIAGNOSIS — L249 Irritant contact dermatitis, unspecified cause: Secondary | ICD-10-CM | POA: Diagnosis not present

## 2020-01-29 ENCOUNTER — Ambulatory Visit: Payer: Medicare PPO | Admitting: Podiatry

## 2020-01-29 ENCOUNTER — Other Ambulatory Visit: Payer: Self-pay

## 2020-01-29 ENCOUNTER — Encounter: Payer: Self-pay | Admitting: Podiatry

## 2020-01-29 DIAGNOSIS — M2012 Hallux valgus (acquired), left foot: Secondary | ICD-10-CM

## 2020-01-29 DIAGNOSIS — M216X2 Other acquired deformities of left foot: Secondary | ICD-10-CM | POA: Diagnosis not present

## 2020-01-29 DIAGNOSIS — M2011 Hallux valgus (acquired), right foot: Secondary | ICD-10-CM

## 2020-01-29 DIAGNOSIS — M2042 Other hammer toe(s) (acquired), left foot: Secondary | ICD-10-CM | POA: Diagnosis not present

## 2020-01-29 NOTE — Progress Notes (Signed)
She presents today for surgical consult regarding her left foot.  States that it is getting so much more painful that she can hardly stand.  States is becoming so painful it is starting to limit limit her ability to perform her daily activities.  She still has a history of A. fib.  And is on Eliquis.  Objective: Vital signs are stable alert oriented x3.  Severe pain on palpation and range of motion of the first metatarsophalangeal joint.  Pulses are strongly palpable.  Has a hammertoe deformity is rigid at the PIPJ but flexible at the metatarsophalangeal joint.  She has a palpable metatarsophalangeal joint as it had been previously broken though does not demonstrate on radiographs.  Radiographs were reviewed today taken previously on demonstrated severe increase in the first intermetatarsal angle greater than normal value hallux abductus angle greater than normal value and a hammertoe deformity second left with an elongated second metatarsal.  Assessment: Pain in limb secondary to hallux abductovalgus deformity plantarflexed second metatarsal as well as a hammertoe deformity second left.  Plan: Discussed etiology pathology conservative versus surgical therapies at this point in time we will request cardiac clearance from her cardiologist at [facility] and we will start her back on her Eliquis the night of surgery.  At this point we consented her for an [facility] bunion repair with screw fixation second metatarsal osteotomy with screw fixation and a hammertoe repair with pin or screw fixation.  She understands this is amenable to it we did discuss the possible postop complications which may include but not limited to postop pain bleeding swelling infection recurrence need for further surgery overcorrection under correction also digit loss of life.  Provided her with information regarding the surgery center as well as the anesthesia group provided her with cam walker and I will follow-up with her in the near future for  surgical intervention.

## 2020-02-09 DIAGNOSIS — Z961 Presence of intraocular lens: Secondary | ICD-10-CM | POA: Diagnosis not present

## 2020-02-09 DIAGNOSIS — H26493 Other secondary cataract, bilateral: Secondary | ICD-10-CM | POA: Diagnosis not present

## 2020-02-09 DIAGNOSIS — H26491 Other secondary cataract, right eye: Secondary | ICD-10-CM | POA: Diagnosis not present

## 2020-02-09 DIAGNOSIS — H35371 Puckering of macula, right eye: Secondary | ICD-10-CM | POA: Diagnosis not present

## 2020-02-26 NOTE — Progress Notes (Signed)
[facility] Note  03/01/2020     CHIEF COMPLAINT Patient presents for Retina Follow Up   HISTORY OF PRESENT ILLNESS: Brandi Rodgers is a 69 y.o. female who presents to the clinic today for:   HPI    Retina Follow Up    Patient presents with  Other.  In right eye.  Duration of 4 months.  Since onset it is stable.  I, the attending physician,  performed the HPI with the patient and updated documentation appropriately.          Comments    4 month follow up ERM OD- Vision stable OU.  Dr. Lucita Ferrara did a YAG OD about 3 weeks ago.  Using Refresh 2-3 x/d.        Last edited by Bernarda Caffey, MD on 03/01/2020  1:22 PM. (History)    Pt happy with [place]  Referring physician: Vevelyn Royals, MD No address on file  HISTORICAL INFORMATION:   Selected notes from the MEDICAL RECORD NUMBER Referred by Dr. Vevelyn Royals for concern of ERM OD   CURRENT MEDICATIONS: No current outpatient medications on file. (Ophthalmic Drugs)   No current facility-administered medications for this visit. (Ophthalmic Drugs)   Current Outpatient Medications (Other)  Medication Sig  . acetaminophen (TYLENOL) 325 MG tablet   . apixaban (ELIQUIS) 5 MG TABS tablet Take 1 tablet (5 mg total) by mouth 2 (two) times daily.  [name] atenolol (TENORMIN) 50 MG tablet Take 50 mg by mouth daily.   [name] bismuth subsalicylate (PEPTO BISMOL) 262 MG chewable tablet Chew 524 mg by mouth as needed.  . Calcium-Vitamin D-Vitamin K (VIACTIV PO) Take 1 tablet by mouth daily.  [name] gabapentin (NEURONTIN) 600 MG tablet Take 1 tablet (600 mg total) by mouth 2 (two) times daily.  [name] letrozole (FEMARA) 2.5 MG tablet Take 1 tablet (2.5 mg total) by mouth daily.  . Multiple Vitamin (MULTIVITAMIN) tablet Take 1 tablet by mouth daily.  . mupirocin ointment (BACTROBAN) 2 %   . rizatriptan (MAXALT-MLT) 10 MG disintegrating tablet Take 10 mg by mouth as needed for migraine. Reported on 09/14/2015   No  current facility-administered medications for this visit. (Other)      REVIEW OF SYSTEMS: ROS    Positive for: Gastrointestinal, Musculoskeletal, Cardiovascular, Eyes, Respiratory, Heme/Lymph   Negative for: Constitutional, Neurological, Skin, Genitourinary, HENT, Endocrine, Psychiatric, Allergic/Imm   Last edited by Leonie Douglas, COA on 03/01/2020  9:58 AM. (History)       ALLERGIES Allergies  Allergen Reactions  . Adhesive [Tape] Other (See Comments)    BLISTERS  . Methocarbamol Hives    PAST MEDICAL HISTORY Past Medical History:  Diagnosis Date  . Arthritis    cervical and lumbar spine  . Breast cancer of lower-outer quadrant of right female breast ([place]) 07/02/2015  . Degenerative lumbar disc   . Dental crowns present   . GERD (gastroesophageal reflux disease)   . Hypercholesterolemia    borderline - no current med.  . Jaw clicking   . Migraines   . Osteoarthritis    bilateral knee  . Paroxysmal atrial fibrillation ([place])    last episode was 01/2014  . Radiation 09/07/15-10/26/15   right breast 50.4 Gy, boost to 10 Gy  . Sleep apnea    uses mouth guard at night; no CPAP   Past Surgical History:  Procedure Laterality Date  . CARPAL TUNNEL RELEASE Right 05/13/2003  . DILATION AND CURETTAGE OF UTERUS    .  KNEE ARTHROSCOPY Right 04/06/2003  . KNEE ARTHROSCOPY Left 11/30/2003  . RADIOACTIVE SEED GUIDED PARTIAL MASTECTOMY WITH AXILLARY SENTINEL LYMPH NODE BIOPSY Right 07/16/2015   Procedure: RADIOACTIVE SEED GUIDED PARTIAL MASTECTOMY WITH AXILLARY SENTINEL LYMPH NODE BIOPSY;  Surgeon: [name], MD;  Location: [facility];  Service: General;  Laterality: Right;  . TOTAL KNEE ARTHROPLASTY Left 03/23/2014   Procedure: LEFT TOTAL KNEE ARTHROPLASTY ;  Surgeon: [name], MD;  Location: [facility];  Service: Orthopedics;  Laterality: Left;  . TOTAL KNEE ARTHROPLASTY Right 08/21/2014   Procedure: RIGHT TOTAL KNEE ARTHROPLASTY;  Surgeon: Gaynelle Arabian, MD;   Location: [facility];  Service: Orthopedics;  Laterality: Right;    FAMILY HISTORY Family History  Problem Relation Age of Onset  . Lung cancer Mother   . Thyroid cancer Mother     SOCIAL HISTORY Social History   Tobacco Use  . Smoking status: Never Smoker  . Smokeless tobacco: Never Used  Substance Use Topics  . Alcohol use: Yes    Comment: occasionally  . Drug use: No         OPHTHALMIC EXAM:  Base Eye Exam    Visual Acuity (Snellen - Linear)      Right Left   Dist [place] 20/30 +2 20/20 -2   Dist ph [place] NI        Tonometry (Tonopen, 10:02 AM)      Right Left   Pressure 15 13       Pupils      Dark Light Shape React APD   Right 3 2 Round Brisk None   Left 3 2 Round Brisk None       Visual Fields (Counting fingers)      Left Right    Full Full       Extraocular Movement      Right Left    Full Full       Neuro/Psych    Oriented x3: Yes   Mood/Affect: Normal       Dilation    Both eyes: 1.0% Mydriacyl, 2.5% Phenylephrine @ 10:03 AM        Slit Lamp and Fundus Exam    Slit Lamp Exam      Right Left   Lids/Lashes Dermatochalasis - upper lid Dermatochalasis - upper lid   Conjunctiva/Sclera White and quiet mild temporal Pinguecula   Cornea trace Punctate epithelial erosions, mild Arcus, well healed cataract wound 1+ inferior Punctate epithelial erosions, mild tear film debris mild Arcus, well healed cataract wound   Anterior Chamber Deep, 1/2+ cell/pigment Deep and clear, no cell or flare   Iris Round and dilated Round and dilated   Lens PC IOL in perfect position, open PC PC IOL in excellent postion, 1+ PCO   Vitreous Vitreous syneresis, Posterior vitreous detachment, mild Asteroid hyalosis inferiorly Vitreous syneresis       Fundus Exam      Right Left   Disc Pink and Sharp, mild temporal PPA Compact, Pink and Sharp, mild temporal PPA   C/D Ratio 0.5 0.6   Macula Flat, Blunted foveal reflex, Epiretinal membrane with early striae--stable, no heme  Flat, Blunted foveal reflex, trace ERM SN mac, No heme or edema   Vessels Vascular attenuation, Tortuous, mild av crossing changes Mild Vascular attenuation, mild tortuosity   Periphery Attached, reticular degeneration, paving stone degeneration inferior and temporal periphery, No RT/RD, No heme  Attached, reticular degeneration, paving stone degeneration inferior and temporal periphery, No RT/RD, No heme  IMAGING AND PROCEDURES  Imaging and Procedures for _0 @  OCT, Retina - OU - Both Eyes       Right Eye Quality was good. Central Foveal Thickness: 390. Progression has been stable. Findings include abnormal foveal contour, no IRF, no SRF, epiretinal membrane, macular pucker (Stable ERM; trace cystic changes temporal mac).   Left Eye Quality was good. Central Foveal Thickness: 276. Progression has been stable. Findings include normal foveal contour, no IRF, no SRF (Trace ERM superiorly--stable).   Notes *Images captured and stored on drive  Diagnosis / Impression:  OD: +ERM with pucker--stable from prior; trace cystic changes temporal mac OS: NFP, no IRF/SRF--stable  Clinical management:  See below  Abbreviations: NFP - Normal foveal profile. CME - cystoid macular edema. PED - pigment epithelial detachment. IRF - intraretinal fluid. SRF - subretinal fluid. EZ - ellipsoid zone. ERM - epiretinal membrane. ORA - outer retinal atrophy. ORT - outer retinal tubulation. SRHM - subretinal hyper-reflective material                 ASSESSMENT/PLAN:    ICD-10-CM   1. Epiretinal membrane (ERM) of right eye  H35.371   2. Retinal edema  H35.81 OCT, Retina - OU - Both Eyes  3. Essential hypertension  I10   4. Hypertensive retinopathy of both eyes  H35.033   5. Pseudophakia of both eyes  Z96.1   6. Combined forms of age-related cataract of both eyes  H25.813     1,2. Epiretinal membrane, OD  - The natural history, anatomy, potential for loss of vision, and treatment  options including vitrectomy techniques and the complications of endophthalmitis, retinal detachment, vitreous hemorrhage, cataract progression and permanent vision loss discussed with the patient.  - mild ERM--stable from prior; trace cystic changes temporal mac  - asymptomatic, no metamorphopsia  - no indication for surgery at this time  - monitor for now  - had CE with IOL OU w/ [place] (OD: 05.18.21, OS: 05.25.21), vision improved and pt very happy  - f/u 4-6 months  3,4. Hypertensive retinopathy OU  - discussed importance of tight BP control  - monitor  5. Pseudophakia OU  - s/p CE/IOL OU (OD: 05.18.21, OS: 05.25.21, [place])  - beautiful surgeries, doing well  - s/p YAG OD, scheduled for YAG OS this Wednesday  - monitor   Ophthalmic Meds Ordered this visit:  No orders of the defined types were placed in this encounter.      Return for 4-6 mo f/u for ERM OD w/DFE&OCT.  There are no Patient Instructions on file for this visit.   This document serves as a record of services personally performed by Gardiner Sleeper, MD, PhD. It was created on their behalf by Leeann Must, Martha, an ophthalmic technician. The creation of this record is the provider's dictation and/or activities during the visit.    This document serves as a record of services personally performed by Gardiner Sleeper, MD, PhD. It was created on their behalf by Estill Bakes, COT an ophthalmic technician. The creation of this record is the provider's dictation and/or activities during the visit.    Electronically signed by: Estill Bakes, COT 10.18.21 @ 1:24 PM  Gardiner Sleeper, M.D., Ph.D. Diseases & Surgery of the Retina and [place] 03/01/2020   I have reviewed the above documentation for accuracy and completeness, and I agree with the above. Gardiner Sleeper, M.D., Ph.D. 03/01/20 1:24 PM    Abbreviations: Jerilynn Mages myopia (  nearsighted); A astigmatism; H hyperopia  (farsighted); P presbyopia; Mrx spectacle prescription;  CTL contact lenses; OD right eye; OS left eye; OU both eyes  XT exotropia; ET esotropia; PEK punctate epithelial keratitis; PEE punctate epithelial erosions; DES dry eye syndrome; MGD meibomian gland dysfunction; ATs artificial tears; PFAT's preservative free artificial tears; [place] nuclear sclerotic cataract; PSC posterior subcapsular cataract; ERM epi-retinal membrane; PVD posterior vitreous detachment; RD retinal detachment; DM diabetes mellitus; DR diabetic retinopathy; NPDR non-proliferative diabetic retinopathy; PDR proliferative diabetic retinopathy; CSME clinically significant macular edema; DME diabetic macular edema; dbh dot blot hemorrhages; CWS cotton wool spot; POAG primary open angle glaucoma; C/D cup-to-disc ratio; HVF humphrey visual field; GVF goldmann visual field; OCT optical coherence tomography; IOP intraocular pressure; BRVO Branch retinal vein occlusion; CRVO central retinal vein occlusion; CRAO central retinal artery occlusion; BRAO branch retinal artery occlusion; RT retinal tear; SB scleral buckle; PPV pars plana vitrectomy; VH Vitreous hemorrhage; PRP panretinal laser photocoagulation; IVK intravitreal kenalog; VMT vitreomacular traction; MH Macular hole;  NVD neovascularization of the disc; NVE neovascularization elsewhere; AREDS age related eye disease study; ARMD age related macular degeneration; POAG primary open angle glaucoma; EBMD epithelial/anterior basement membrane dystrophy; ACIOL anterior chamber intraocular lens; IOL intraocular lens; PCIOL posterior chamber intraocular lens; Phaco/IOL phacoemulsification with intraocular lens placement; [place] photorefractive keratectomy; LASIK laser assisted in situ keratomileusis; HTN hypertension; DM diabetes mellitus; COPD chronic obstructive pulmonary disease

## 2020-03-01 ENCOUNTER — Encounter (INDEPENDENT_AMBULATORY_CARE_PROVIDER_SITE_OTHER): Payer: Self-pay | Admitting: Ophthalmology

## 2020-03-01 ENCOUNTER — Ambulatory Visit (INDEPENDENT_AMBULATORY_CARE_PROVIDER_SITE_OTHER): Payer: Medicare PPO | Admitting: Ophthalmology

## 2020-03-01 ENCOUNTER — Other Ambulatory Visit: Payer: Self-pay

## 2020-03-01 DIAGNOSIS — Z961 Presence of intraocular lens: Secondary | ICD-10-CM | POA: Diagnosis not present

## 2020-03-01 DIAGNOSIS — H3581 Retinal edema: Secondary | ICD-10-CM

## 2020-03-01 DIAGNOSIS — H25813 Combined forms of age-related cataract, bilateral: Secondary | ICD-10-CM | POA: Diagnosis not present

## 2020-03-01 DIAGNOSIS — I10 Essential (primary) hypertension: Secondary | ICD-10-CM

## 2020-03-01 DIAGNOSIS — H35033 Hypertensive retinopathy, bilateral: Secondary | ICD-10-CM

## 2020-03-01 DIAGNOSIS — H35371 Puckering of macula, right eye: Secondary | ICD-10-CM | POA: Diagnosis not present

## 2020-03-03 DIAGNOSIS — H26492 Other secondary cataract, left eye: Secondary | ICD-10-CM | POA: Diagnosis not present

## 2020-03-10 DIAGNOSIS — H5212 Myopia, left eye: Secondary | ICD-10-CM | POA: Diagnosis not present

## 2020-03-10 DIAGNOSIS — H35371 Puckering of macula, right eye: Secondary | ICD-10-CM | POA: Diagnosis not present

## 2020-03-10 DIAGNOSIS — Z961 Presence of intraocular lens: Secondary | ICD-10-CM | POA: Diagnosis not present

## 2020-03-10 DIAGNOSIS — H52223 Regular astigmatism, bilateral: Secondary | ICD-10-CM | POA: Diagnosis not present

## 2020-03-10 DIAGNOSIS — Z9842 Cataract extraction status, left eye: Secondary | ICD-10-CM | POA: Diagnosis not present

## 2020-03-18 ENCOUNTER — Encounter: Payer: Medicare PPO | Admitting: Podiatry

## 2020-03-25 ENCOUNTER — Encounter: Payer: Medicare PPO | Admitting: Podiatry

## 2020-03-25 DIAGNOSIS — L810 Postinflammatory hyperpigmentation: Secondary | ICD-10-CM | POA: Diagnosis not present

## 2020-03-25 DIAGNOSIS — L905 Scar conditions and fibrosis of skin: Secondary | ICD-10-CM | POA: Diagnosis not present

## 2020-04-13 ENCOUNTER — Encounter: Payer: Medicare PPO | Admitting: Podiatry

## 2020-04-16 ENCOUNTER — Other Ambulatory Visit: Payer: Self-pay | Admitting: Hematology and Oncology

## 2020-04-27 ENCOUNTER — Encounter: Payer: Medicare PPO | Admitting: Podiatry

## 2020-05-15 HISTORY — PX: CATARACT EXTRACTION: SUR2

## 2020-05-26 ENCOUNTER — Telehealth: Payer: Self-pay

## 2020-05-26 NOTE — Telephone Encounter (Signed)
Brandi Rodgers called to cancel her surgery with Dr. Milinda Pointer on 06/25/2020. She stated she is concerned with the rise of Covid and just doesn't think she is ready to have the surgery at this time. She stated she want to wait till next year to see how things are going. I notified Caren Griffins with [facility] and Dr. Milinda Pointer.

## 2020-06-11 DIAGNOSIS — Z20822 Contact with and (suspected) exposure to covid-19: Secondary | ICD-10-CM | POA: Diagnosis not present

## 2020-06-11 DIAGNOSIS — Z03818 Encounter for observation for suspected exposure to other biological agents ruled out: Secondary | ICD-10-CM | POA: Diagnosis not present

## 2020-07-01 ENCOUNTER — Encounter: Payer: Medicare PPO | Admitting: Podiatry

## 2020-07-02 ENCOUNTER — Encounter (INDEPENDENT_AMBULATORY_CARE_PROVIDER_SITE_OTHER): Payer: Medicare PPO | Admitting: Ophthalmology

## 2020-07-08 ENCOUNTER — Encounter: Payer: Medicare PPO | Admitting: Podiatry

## 2020-07-22 ENCOUNTER — Encounter: Payer: Medicare PPO | Admitting: Podiatry

## 2020-07-28 NOTE — Progress Notes (Shared)
[facility] Note  07/30/2020     CHIEF COMPLAINT Patient presents for No chief complaint on file.   HISTORY OF PRESENT ILLNESS: Brandi Rodgers is a 70 y.o. female who presents to the clinic today for:   Pt happy with [place]  Referring physician: Jonathon Jordan, MD [place] [address],  [place] [address]  HISTORICAL INFORMATION:   Selected notes from the MEDICAL RECORD NUMBER Referred by Dr. Vevelyn Royals for concern of ERM OD   CURRENT MEDICATIONS: No current outpatient medications on file. (Ophthalmic Drugs)   No current facility-administered medications for this visit. (Ophthalmic Drugs)   Current Outpatient Medications (Other)  Medication Sig  . acetaminophen (TYLENOL) 325 MG tablet   . apixaban (ELIQUIS) 5 MG TABS tablet Take 1 tablet (5 mg total) by mouth 2 (two) times daily.  [name] atenolol (TENORMIN) 50 MG tablet Take 50 mg by mouth daily.   [name] bismuth subsalicylate (PEPTO BISMOL) 262 MG chewable tablet Chew 524 mg by mouth as needed.  . Calcium-Vitamin D-Vitamin K (VIACTIV PO) Take 1 tablet by mouth daily.  [name] gabapentin (NEURONTIN) 600 MG tablet Take 1 tablet (600 mg total) by mouth 2 (two) times daily.  [name] letrozole (FEMARA) 2.5 MG tablet Take 1 tablet (2.5 mg total) by mouth daily.  . Multiple Vitamin (MULTIVITAMIN) tablet Take 1 tablet by mouth daily.  . mupirocin ointment (BACTROBAN) 2 %   . rizatriptan (MAXALT-MLT) 10 MG disintegrating tablet Take 10 mg by mouth as needed for migraine. Reported on 09/14/2015   No current facility-administered medications for this visit. (Other)      REVIEW OF SYSTEMS:    ALLERGIES Allergies  Allergen Reactions  . Adhesive [Tape] Other (See Comments)    BLISTERS  . Methocarbamol Hives    PAST MEDICAL HISTORY Past Medical History:  Diagnosis Date  . Arthritis    cervical and lumbar spine  . Breast cancer of lower-outer quadrant of right female breast ([place])  07/02/2015  . Degenerative lumbar disc   . Dental crowns present   . GERD (gastroesophageal reflux disease)   . Hypercholesterolemia    borderline - no current med.  . Jaw clicking   . Migraines   . Osteoarthritis    bilateral knee  . Paroxysmal atrial fibrillation ([place])    last episode was 01/2014  . Radiation 09/07/15-10/26/15   right breast 50.4 Gy, boost to 10 Gy  . Sleep apnea    uses mouth guard at night; no CPAP   Past Surgical History:  Procedure Laterality Date  . CARPAL TUNNEL RELEASE Right 05/13/2003  . DILATION AND CURETTAGE OF UTERUS    . KNEE ARTHROSCOPY Right 04/06/2003  . KNEE ARTHROSCOPY Left 11/30/2003  . RADIOACTIVE SEED GUIDED PARTIAL MASTECTOMY WITH AXILLARY SENTINEL LYMPH NODE BIOPSY Right 07/16/2015   Procedure: RADIOACTIVE SEED GUIDED PARTIAL MASTECTOMY WITH AXILLARY SENTINEL LYMPH NODE BIOPSY;  Surgeon: [name], MD;  Location: [facility];  Service: General;  Laterality: Right;  . TOTAL KNEE ARTHROPLASTY Left 03/23/2014   Procedure: LEFT TOTAL KNEE ARTHROPLASTY ;  Surgeon: [name], MD;  Location: [facility];  Service: Orthopedics;  Laterality: Left;  . TOTAL KNEE ARTHROPLASTY Right 08/21/2014   Procedure: RIGHT TOTAL KNEE ARTHROPLASTY;  Surgeon: Gaynelle Arabian, MD;  Location: [facility];  Service: Orthopedics;  Laterality: Right;    FAMILY HISTORY Family History  Problem Relation Age of Onset  . Lung cancer Mother   . Thyroid  cancer Mother     SOCIAL HISTORY Social History   Tobacco Use  . Smoking status: Never Smoker  . Smokeless tobacco: Never Used  Substance Use Topics  . Alcohol use: Yes    Comment: occasionally  . Drug use: No         OPHTHALMIC EXAM:  Not recorded     IMAGING AND PROCEDURES  Imaging and Procedures for _0 @           ASSESSMENT/PLAN:    ICD-10-CM   1. Epiretinal membrane (ERM) of right eye  H35.371   2. Retinal edema  H35.81   3. Essential hypertension  I10   4. Hypertensive  retinopathy of both eyes  H35.033   5. Pseudophakia of both eyes  Z96.1   6. Combined forms of age-related cataract of both eyes  H25.813     1,2. Epiretinal membrane, OD  - mild ERM--stable from prior; trace cystic changes temporal mac  - asymptomatic, no metamorphopsia  - no indication for surgery at this time  - monitor for now  - had CE with IOL OU w/ [facility] (OD: 05.18.21, OS: 05.25.21), vision improved and pt very happy  - f/u 4-6 months  3,4. Hypertensive retinopathy OU  - discussed importance of tight BP control  - monitor  5. Pseudophakia OU  - s/p CE/IOL OU (OD: 05.18.21, OS: 05.25.21, [facility])  - beautiful surgeries, doing well  - s/p YAG OD, scheduled for YAG OS this Wednesday  - monitor   Ophthalmic Meds Ordered this visit:  No orders of the defined types were placed in this encounter.      No follow-ups on file.  There are no Patient Instructions on file for this visit.   This document serves as a record of services personally performed by Gardiner Sleeper, MD, PhD. It was created on their behalf by Leeann Must, [place], an ophthalmic technician. The creation of this record is the provider's dictation and/or activities during the visit.    This document serves as a record of services personally performed by Gardiner Sleeper, MD, PhD. It was created on their behalf by San Jetty. Owens Shark, OA an ophthalmic technician. The creation of this record is the provider's dictation and/or activities during the visit.    Electronically signed by: San Jetty. Owens Shark, [place] 03.16.2022 8:30 AM  Gardiner Sleeper, M.D., Ph.D. Diseases & Surgery of the Retina and Vitreous [facility]: M myopia (nearsighted); A astigmatism; H hyperopia (farsighted); P presbyopia; Mrx spectacle prescription;  CTL contact lenses; OD right eye; OS left eye; OU both eyes  XT exotropia; ET esotropia; PEK punctate epithelial keratitis; PEE punctate epithelial  erosions; DES dry eye syndrome; MGD meibomian gland dysfunction; ATs artificial tears; PFAT's preservative free artificial tears; [place] nuclear sclerotic cataract; PSC posterior subcapsular cataract; ERM epi-retinal membrane; PVD posterior vitreous detachment; RD retinal detachment; DM diabetes mellitus; DR diabetic retinopathy; NPDR non-proliferative diabetic retinopathy; PDR proliferative diabetic retinopathy; CSME clinically significant macular edema; DME diabetic macular edema; dbh dot blot hemorrhages; CWS cotton wool spot; POAG primary open angle glaucoma; C/D cup-to-disc ratio; HVF humphrey visual field; GVF goldmann visual field; OCT optical coherence tomography; IOP intraocular pressure; BRVO Branch retinal vein occlusion; CRVO central retinal vein occlusion; CRAO central retinal artery occlusion; BRAO branch retinal artery occlusion; RT retinal tear; SB scleral buckle; PPV pars plana vitrectomy; VH Vitreous hemorrhage; PRP panretinal laser photocoagulation; IVK intravitreal kenalog; VMT vitreomacular traction; MH Macular hole;  NVD  neovascularization of the disc; NVE neovascularization elsewhere; AREDS age related eye disease study; ARMD age related macular degeneration; POAG primary open angle glaucoma; EBMD epithelial/anterior basement membrane dystrophy; ACIOL anterior chamber intraocular lens; IOL intraocular lens; PCIOL posterior chamber intraocular lens; Phaco/IOL phacoemulsification with intraocular lens placement; [place] photorefractive keratectomy; LASIK laser assisted in situ keratomileusis; HTN hypertension; DM diabetes mellitus; COPD chronic obstructive pulmonary disease

## 2020-07-30 ENCOUNTER — Encounter (INDEPENDENT_AMBULATORY_CARE_PROVIDER_SITE_OTHER): Payer: Medicare PPO | Admitting: Ophthalmology

## 2020-08-05 ENCOUNTER — Encounter: Payer: Medicare PPO | Admitting: Podiatry

## 2020-08-10 ENCOUNTER — Encounter: Payer: Medicare PPO | Admitting: Podiatry

## 2020-08-11 NOTE — Progress Notes (Signed)
[facility] Note  08/13/2020     CHIEF COMPLAINT Patient presents for Retina Follow Up   HISTORY OF PRESENT ILLNESS: Brandi Rodgers is a 70 y.o. female who presents to the clinic today for:   HPI    Retina Follow Up    Patient presents with  Other.  In right eye.  Severity is mild.  Since onset it is stable.  I, the attending physician,  performed the HPI with the patient and updated documentation appropriately.          Comments    Pt states vision is very good, occasional floaters, but she rarely notices them, she uses Refresh PRN       Last edited by Bernarda Caffey, MD on 08/13/2020  1:19 PM. (History)    Pt states when she has both eyes open, her vision is good, if she has to close her left eye objects tend to "jump"  Referring physician: Jonathon Jordan, MD [place] [address],  [place] [address]  HISTORICAL INFORMATION:   Selected notes from the [facility] Referred by Dr. Vevelyn Royals for concern of ERM OD   CURRENT MEDICATIONS: No current outpatient medications on file. (Ophthalmic Drugs)   No current facility-administered medications for this visit. (Ophthalmic Drugs)   Current Outpatient Medications (Other)  Medication Sig  . acetaminophen (TYLENOL) 325 MG tablet   . apixaban (ELIQUIS) 5 MG TABS tablet Take 1 tablet (5 mg total) by mouth 2 (two) times daily.  [name] atenolol (TENORMIN) 50 MG tablet Take 50 mg by mouth daily.   [name] bismuth subsalicylate (PEPTO BISMOL) 262 MG chewable tablet Chew 524 mg by mouth as needed.  . Calcium-Vitamin D-Vitamin K (VIACTIV PO) Take 1 tablet by mouth daily.  [name] gabapentin (NEURONTIN) 600 MG tablet Take 1 tablet (600 mg total) by mouth 2 (two) times daily.  [name] letrozole (FEMARA) 2.5 MG tablet Take 1 tablet (2.5 mg total) by mouth daily.  . Multiple Vitamin (MULTIVITAMIN) tablet Take 1 tablet by mouth daily.  . mupirocin ointment (BACTROBAN) 2 %   . rizatriptan  (MAXALT-MLT) 10 MG disintegrating tablet Take 10 mg by mouth as needed for migraine. Reported on 09/14/2015   No current facility-administered medications for this visit. (Other)      REVIEW OF SYSTEMS: ROS    Positive for: Musculoskeletal, Eyes   Negative for: Constitutional, Gastrointestinal, Neurological, Skin, Genitourinary, HENT, Endocrine, Cardiovascular, Respiratory, Psychiatric, Allergic/Imm, Heme/Lymph   Last edited by Debbrah Alar, COT on 08/13/2020  8:07 AM. (History)       ALLERGIES Allergies  Allergen Reactions  . Adhesive [Tape] Other (See Comments)    BLISTERS  . Methocarbamol Hives    PAST MEDICAL HISTORY Past Medical History:  Diagnosis Date  . Arthritis    cervical and lumbar spine  . Breast cancer of lower-outer quadrant of right female breast ([place]) 07/02/2015  . Degenerative lumbar disc   . Dental crowns present   . GERD (gastroesophageal reflux disease)   . Hypercholesterolemia    borderline - no current med.  . Jaw clicking   . Migraines   . Osteoarthritis    bilateral knee  . Paroxysmal atrial fibrillation ([place])    last episode was 01/2014  . Radiation 09/07/15-10/26/15   right breast 50.4 Gy, boost to 10 Gy  . Sleep apnea    uses mouth guard at night; no CPAP   Past Surgical History:  Procedure Laterality Date  .  CARPAL TUNNEL RELEASE Right 05/13/2003  . DILATION AND CURETTAGE OF UTERUS    . KNEE ARTHROSCOPY Right 04/06/2003  . KNEE ARTHROSCOPY Left 11/30/2003  . RADIOACTIVE SEED GUIDED PARTIAL MASTECTOMY WITH AXILLARY SENTINEL LYMPH NODE BIOPSY Right 07/16/2015   Procedure: RADIOACTIVE SEED GUIDED PARTIAL MASTECTOMY WITH AXILLARY SENTINEL LYMPH NODE BIOPSY;  Surgeon: [name], MD;  Location: [facility];  Service: General;  Laterality: Right;  . TOTAL KNEE ARTHROPLASTY Left 03/23/2014   Procedure: LEFT TOTAL KNEE ARTHROPLASTY ;  Surgeon: [name], MD;  [facility];  Service: Orthopedics;  Laterality: Left;  .  TOTAL KNEE ARTHROPLASTY Right 08/21/2014   Procedure: RIGHT TOTAL KNEE ARTHROPLASTY;  Surgeon: Gaynelle Arabian, MD;  [facility];  Service: Orthopedics;  Laterality: Right;    FAMILY HISTORY Family History  Problem Relation Age of Onset  . Lung cancer Mother   . Thyroid cancer Mother     SOCIAL HISTORY Social History   Tobacco Use  . Smoking status: Never Smoker  . Smokeless tobacco: Never Used  Substance Use Topics  . Alcohol use: Yes    Comment: occasionally  . Drug use: No         OPHTHALMIC EXAM:  Base Eye Exam    Visual Acuity (Snellen - Linear)      Right Left   Dist [place] 20/25 -2 20/20   Dist ph [place] NI        Tonometry (Tonopen, 8:13 AM)      Right Left   Pressure 16 12       Pupils      Dark Light Shape React APD   Right 4 2 Round Brisk None   Left 4 2 Round Brisk None       Visual Fields (Counting fingers)      Left Right    Full Full       Extraocular Movement      Right Left    Full, Ortho Full, Ortho       Neuro/Psych    Oriented x3: Yes   Mood/Affect: Normal       Dilation    Both eyes: 1.0% Mydriacyl, 2.5% Phenylephrine @ 8:13 AM        Slit Lamp and Fundus Exam    Slit Lamp Exam      Right Left   Lids/Lashes Dermatochalasis - upper lid Dermatochalasis - upper lid   Conjunctiva/Sclera White and quiet mild temporal Pinguecula   Cornea 1+ Punctate epithelial erosions, mild Arcus, well healed cataract wound, tear film debris 1-2+ Punctate epithelial erosions, mild tear film debris, mild Arcus, well healed cataract wound   Anterior Chamber Deep and quiet Deep and quiet   Iris Round and dilated Round and dilated   Lens PC IOL in perfect position (vivity), open PC  PC IOL in excellent postion (viviity), open PC   Vitreous Vitreous syneresis, Posterior vitreous detachment, mild Asteroid hyalosis inferiorly Vitreous syneresis       Fundus Exam      Right Left   Disc Trace pallor, sharp rim, mild temporal PPA Compact, Pink and Sharp,  mild temporal PPA   C/D Ratio 0.5 0.6   Macula Flat, Blunted foveal reflex, Epiretinal membrane with early striae--stable, no heme Flat, Blunted foveal reflex, trace ERM SN mac, mild RPE mottling, No heme or edema   Vessels Vascular attenuation, Tortuous, mild av crossing changes Mild Vascular attenuation, mild tortuosity   Periphery Attached, reticular degeneration, paving stone degeneration inferior and  temporal periphery, No RT/RD, No heme  Attached, reticular degeneration, paving stone degeneration inferior and temporal periphery, No RT/RD, No heme           IMAGING AND PROCEDURES  Imaging and Procedures for _0 @  OCT, Retina - OU - Both Eyes       Right Eye Quality was good. Central Foveal Thickness: 386. Progression has been stable. Findings include abnormal foveal contour, no IRF, no SRF, epiretinal membrane, macular pucker (Stable ERM).   Left Eye Quality was good. Central Foveal Thickness: 278. Progression has been stable. Findings include normal foveal contour, no IRF, no SRF (Trace ERM superiorly--stable).   Notes *Images captured and stored on drive  Diagnosis / Impression:  OD: +ERM with pucker--stable from prior OS: NFP, no IRF/SRF--stable  Clinical management:  See below  Abbreviations: NFP - Normal foveal profile. CME - cystoid macular edema. PED - pigment epithelial detachment. IRF - intraretinal fluid. SRF - subretinal fluid. EZ - ellipsoid zone. ERM - epiretinal membrane. ORA - outer retinal atrophy. ORT - outer retinal tubulation. SRHM - subretinal hyper-reflective material               ASSESSMENT/PLAN:    ICD-10-CM   1. Epiretinal membrane (ERM) of right eye  H35.371   2. Retinal edema  H35.81 OCT, Retina - OU - Both Eyes  3. Essential hypertension  I10   4. Hypertensive retinopathy of both eyes  H35.033   5. Pseudophakia of both eyes  Z96.1   6. Combined forms of age-related cataract of both eyes  H25.813     1,2. Epiretinal membrane,  OD  - mild ERM--no significant change from prior  - BCVA 20/25 -2 OD  - asymptomatic, no metamorphopsia  - no indication for surgery at this time  - monitor for now  - had CE with IOL OU w/ [facility] (OD: 05.18.21, OS: 05.25.21), vision improved and pt very happy  - f/u 12 months, sooner prn -- DFE/OCT  3,4. Hypertensive retinopathy OU  - discussed importance of tight BP control  - monitor  5. Pseudophakia OU  - s/p CE/IOL OU (OD: 05.18.21, OS: 05.25.21, Vivity OU, [facility])  - s/p YAG cap OU  - beautiful surgeries, doing well  - monitor   Ophthalmic Meds Ordered this visit:  No orders of the defined types were placed in this encounter.      Return in about 1 year (around 08/13/2021) for f/u ERM OD, DFE, OCT.  There are no Patient Instructions on file for this visit.   This document serves as a record of services personally performed by Gardiner Sleeper, MD, PhD. It was created on their behalf by Leeann Must, [place], an ophthalmic technician. The creation of this record is the provider's dictation and/or activities during the visit.    This document serves as a record of services personally performed by Gardiner Sleeper, MD, PhD. It was created on their behalf by San Jetty. Owens Shark, OA an ophthalmic technician. The creation of this record is the provider's dictation and/or activities during the visit.    Electronically signed by: San Jetty. [place], [place] 03.30.2022 1:23 PM  Gardiner Sleeper, M.D., Ph.D. Diseases & Surgery of the Retina and Vitreous [facility]  I have reviewed the above documentation for accuracy and completeness, and I agree with the above. Gardiner Sleeper, M.D., Ph.D. 08/13/20 1:23 PM  Abbreviations: M myopia (nearsighted); A astigmatism; H hyperopia (farsighted); P presbyopia; Mrx spectacle prescription;  CTL contact lenses; OD right eye; OS left eye; OU both eyes  XT exotropia; ET esotropia; PEK punctate epithelial keratitis; PEE  punctate epithelial erosions; DES dry eye syndrome; MGD meibomian gland dysfunction; ATs artificial tears; PFAT's preservative free artificial tears; [place] nuclear sclerotic cataract; PSC posterior subcapsular cataract; ERM epi-retinal membrane; PVD posterior vitreous detachment; RD retinal detachment; DM diabetes mellitus; DR diabetic retinopathy; NPDR non-proliferative diabetic retinopathy; PDR proliferative diabetic retinopathy; CSME clinically significant macular edema; DME diabetic macular edema; dbh dot blot hemorrhages; CWS cotton wool spot; POAG primary open angle glaucoma; C/D cup-to-disc ratio; HVF humphrey visual field; GVF goldmann visual field; OCT optical coherence tomography; IOP intraocular pressure; BRVO Branch retinal vein occlusion; CRVO central retinal vein occlusion; CRAO central retinal artery occlusion; BRAO branch retinal artery occlusion; RT retinal tear; SB scleral buckle; PPV pars plana vitrectomy; VH Vitreous hemorrhage; PRP panretinal laser photocoagulation; IVK intravitreal kenalog; VMT vitreomacular traction; MH Macular hole;  NVD neovascularization of the disc; NVE neovascularization elsewhere; AREDS age related eye disease study; ARMD age related macular degeneration; POAG primary open angle glaucoma; EBMD epithelial/anterior basement membrane dystrophy; ACIOL anterior chamber intraocular lens; IOL intraocular lens; PCIOL posterior chamber intraocular lens; Phaco/IOL phacoemulsification with intraocular lens placement; [place] photorefractive keratectomy; LASIK laser assisted in situ keratomileusis; HTN hypertension; DM diabetes mellitus; COPD chronic obstructive pulmonary disease

## 2020-08-13 ENCOUNTER — Ambulatory Visit (INDEPENDENT_AMBULATORY_CARE_PROVIDER_SITE_OTHER): Payer: Medicare PPO | Admitting: Ophthalmology

## 2020-08-13 ENCOUNTER — Encounter (INDEPENDENT_AMBULATORY_CARE_PROVIDER_SITE_OTHER): Payer: Self-pay | Admitting: Ophthalmology

## 2020-08-13 ENCOUNTER — Other Ambulatory Visit: Payer: Self-pay

## 2020-08-13 DIAGNOSIS — H35033 Hypertensive retinopathy, bilateral: Secondary | ICD-10-CM

## 2020-08-13 DIAGNOSIS — H25813 Combined forms of age-related cataract, bilateral: Secondary | ICD-10-CM | POA: Diagnosis not present

## 2020-08-13 DIAGNOSIS — I10 Essential (primary) hypertension: Secondary | ICD-10-CM

## 2020-08-13 DIAGNOSIS — H3581 Retinal edema: Secondary | ICD-10-CM

## 2020-08-13 DIAGNOSIS — Z961 Presence of intraocular lens: Secondary | ICD-10-CM | POA: Diagnosis not present

## 2020-08-13 DIAGNOSIS — H35371 Puckering of macula, right eye: Secondary | ICD-10-CM | POA: Diagnosis not present

## 2020-09-02 DIAGNOSIS — H524 Presbyopia: Secondary | ICD-10-CM | POA: Diagnosis not present

## 2020-09-02 DIAGNOSIS — Z961 Presence of intraocular lens: Secondary | ICD-10-CM | POA: Diagnosis not present

## 2020-09-14 DIAGNOSIS — Z853 Personal history of malignant neoplasm of breast: Secondary | ICD-10-CM | POA: Diagnosis not present

## 2020-10-12 ENCOUNTER — Telehealth: Payer: Self-pay | Admitting: Hematology and Oncology

## 2020-10-12 NOTE — Telephone Encounter (Signed)
R/s per prov PAL, 7/26 los, pt aware

## 2020-10-16 ENCOUNTER — Other Ambulatory Visit: Payer: Self-pay | Admitting: Cardiovascular Disease

## 2020-11-29 NOTE — Progress Notes (Signed)
Patient Care Team: Jonathon Jordan, MD as PCP - General (Family Medicine) Croitoru, Dani Gobble, MD as PCP - Cardiology (Cardiology) Sylvan Cheese, NP as Nurse Practitioner (Hematology and Oncology)  DIAGNOSIS:    ICD-10-CM   1. Malignant neoplasm of lower-outer quadrant of right breast of female, estrogen receptor positive ([place])  C50.511    Z17.0       SUMMARY OF ONCOLOGIC HISTORY: Oncology History  Breast cancer of lower-outer quadrant of right female breast ([place])  06/25/2015 Initial Diagnosis   Right breast screening det focal asymmetry plus calcs 4 mm at 8:00 axilla neg, 3-D biopsy: Grade 1 IDC with DCIS and calcifications, ER 95%, PR 90% HER-2 negative ratio 1.70, KI 67:15%, clinical stage: T1aN0 Stage 1A     07/16/2015 Surgery   Rt Lumpectomy [facility]): IDC grade 2, 0.6 cm, with DCIS, 0/4 LN, ER 95%, PR 90% HER-2 negative ratio 1.70, KI 67:15%, Path stage: T1aN0 Stage 1A , Oncotype DX score 19, 12% ROR   09/07/2015 - 10/26/2015 Radiation Therapy   Adjuvant radiation therapy ([place]). Right breast treated to 50.4 Gy in 28 fractions.  Right breast boost treated to 10 Gy in 5 fractions     11/01/2015 -  Anti-estrogen oral therapy   Anastrozole 1 mg daily stopped March 2018 due to severe hot flashes switched to letrozole 08/28/2016 switched to exemestane 11/27/2017     CHIEF COMPLIANT: Follow-up of right breast cancer on letrozole therapy  INTERVAL HISTORY: Brandi Rodgers is a 70 y.o. with above-mentioned history of right breast cancer treated with lumpectomy, radiation, and who is currently on anti-estrogen therapy with letrozole. Mammogram on 10/08/20 showed no evidence of malignancy bilaterally. She presents to the clinic today for follow-up.  She is tolerating letrozole reasonably well.  She continues to suffer from hot flashes which happen at least twice every hour.  Nothing has helped alleviate that.  She does not have any major muscle aches and pains.  She has been  through [employer] and had lost almost 20 pounds over the last year.  ALLERGIES:  is allergic to adhesive [tape] and methocarbamol.  MEDICATIONS:  Current Outpatient Medications  Medication Sig Dispense Refill   acetaminophen (TYLENOL) 325 MG tablet      atenolol (TENORMIN) 50 MG tablet Take 50 mg by mouth daily.      bismuth subsalicylate (PEPTO BISMOL) 262 MG chewable tablet Chew 524 mg by mouth as needed.     Calcium-Vitamin D-Vitamin K (VIACTIV PO) Take 1 tablet by mouth daily.     ELIQUIS 5 MG TABS tablet TAKE 1 TABLET BY MOUTH TWICE A DAY 180 tablet 1   gabapentin (NEURONTIN) 600 MG tablet Take 1 tablet (600 mg total) by mouth 2 (two) times daily.     letrozole (FEMARA) 2.5 MG tablet Take 1 tablet (2.5 mg total) by mouth daily. 90 tablet 3   Multiple Vitamin (MULTIVITAMIN) tablet Take 1 tablet by mouth daily.     mupirocin ointment (BACTROBAN) 2 %      rizatriptan (MAXALT-MLT) 10 MG disintegrating tablet Take 10 mg by mouth as needed for migraine. Reported on 09/14/2015     No current facility-administered medications for this visit.    PHYSICAL EXAMINATION: ECOG PERFORMANCE STATUS: 1 - Symptomatic but completely ambulatory  Vitals:   11/30/20 1012  BP: (!) 141/84  Pulse: 72  Resp: 18  Temp: 97.7 F (36.5 C)  SpO2: 98%   Filed Weights   11/30/20 1012  Weight: 244 lb 12.8 oz (111  kg)    BREAST: No palpable masses or nodules in either right or left breasts. No palpable axillary supraclavicular or infraclavicular adenopathy no breast tenderness or nipple discharge. (exam performed in the presence of a chaperone)  LABORATORY DATA:  I have reviewed the data as listed CMP Latest Ref Rng & Units 12/27/2015 07/07/2015 08/23/2014  Glucose 65 - 99 mg/dL 98 91 136(H)  BUN 7 - 25 mg/dL 9 10.9 11  Creatinine 0.50 - 0.99 mg/dL 0.83 0.8 0.57  Sodium 135 - 146 mmol/L 141 142 138  Potassium 3.5 - 5.3 mmol/L 4.1 4.0 4.6  Chloride 98 - 110 mmol/L 102 - 103  CO2 20 - 31 mmol/L  32(H) 29 30  Calcium 8.6 - 10.4 mg/dL 9.6 10.0 8.8  Total Protein 6.4 - 8.3 g/dL - 7.1 -  Total Bilirubin 0.20 - 1.20 mg/dL - 0.52 -  Alkaline Phos 40 - 150 U/L - 127 -  AST 5 - 34 U/L - 22 -  ALT 0 - 55 U/L - 18 -    Lab Results  Component Value Date   WBC 8.3 12/27/2015   HGB 13.2 12/27/2015   HCT 39.9 12/27/2015   MCV 86.7 12/27/2015   PLT 229 12/27/2015   NEUTROABS 6.6 (H) 07/07/2015    ASSESSMENT & PLAN:  Breast cancer of lower-outer quadrant of right female breast ([place]) Rt Lumpectomy 07/16/15: IDC grade 2, 0.6 cm, with DCIS, 0/4 LN, ER 95%, PR 90% HER-2 negative ratio 1.70, KI 67:15%, Path stage: T1aN0 Stage 1A Oncotype DX score 19, 12% risk of recurrence Status post radiation 09/08/2015 to 10/26/2015   Current treatment: Anastrozole 1 mg daily 7 years started 11/01/2015 stopped March 2018, switched to letrozole 08/28/2016 because of continuing hot flashes I sent a prescription for exemestane (did not make a difference).   she went  back to letrozole.   Letrozole toxicities: 1. Hot flashes 10-15 times per day associated with sweating previously Effexor did not work. She is currently on gabapentin for migraine prevention but it does not seem to reduce hot flashes. 2. myalgias arthralgias markedly better with letrozole.   Bone density done February 2019: T score -0.2 A.Fib on Eliquis Vaginal Dryness:   Breast cancer surveillance: 1. Breast exam  11/30/2020: No abnormalities of concern 2. Mammograms at [facility] 09/14/2020: Benign, breast density category A   She is looking forward to spending her 50th anniversary in [place] and [place] next year. Weight: She lost 20 pounds using weight watchers.  She is proud of that.  Return to clinic in 1 year for follow-up.    No orders of the defined types were placed in this encounter.  The patient has a good understanding of the overall plan. she agrees with it. she will call with any problems that may develop before the next visit  here.  Total time spent: 20 mins including face to face time and time spent for planning, charting and coordination of care  Rulon Eisenmenger, MD, MPH 11/30/2020  I, Thana Ates, am acting as scribe for Dr. Nicholas Lose.  I have reviewed the above documentation for accuracy and completeness, and I agree with the above.

## 2020-11-30 ENCOUNTER — Inpatient Hospital Stay: Payer: Medicare PPO | Attending: Hematology and Oncology | Admitting: Hematology and Oncology

## 2020-11-30 ENCOUNTER — Other Ambulatory Visit: Payer: Self-pay

## 2020-11-30 DIAGNOSIS — C50511 Malignant neoplasm of lower-outer quadrant of right female breast: Secondary | ICD-10-CM | POA: Diagnosis not present

## 2020-11-30 DIAGNOSIS — Z7901 Long term (current) use of anticoagulants: Secondary | ICD-10-CM | POA: Insufficient documentation

## 2020-11-30 DIAGNOSIS — Z888 Allergy status to other drugs, medicaments and biological substances status: Secondary | ICD-10-CM | POA: Diagnosis not present

## 2020-11-30 DIAGNOSIS — Z170 Estrogen receptor positive status [ER+]: Secondary | ICD-10-CM | POA: Diagnosis not present

## 2020-11-30 DIAGNOSIS — Z79899 Other long term (current) drug therapy: Secondary | ICD-10-CM | POA: Diagnosis not present

## 2020-11-30 DIAGNOSIS — Z923 Personal history of irradiation: Secondary | ICD-10-CM | POA: Diagnosis not present

## 2020-11-30 DIAGNOSIS — R232 Flushing: Secondary | ICD-10-CM | POA: Insufficient documentation

## 2020-11-30 DIAGNOSIS — M2550 Pain in unspecified joint: Secondary | ICD-10-CM | POA: Diagnosis not present

## 2020-11-30 DIAGNOSIS — Z79811 Long term (current) use of aromatase inhibitors: Secondary | ICD-10-CM | POA: Insufficient documentation

## 2020-11-30 DIAGNOSIS — M791 Myalgia, unspecified site: Secondary | ICD-10-CM | POA: Diagnosis not present

## 2020-11-30 DIAGNOSIS — I4891 Unspecified atrial fibrillation: Secondary | ICD-10-CM | POA: Insufficient documentation

## 2020-11-30 MED ORDER — LETROZOLE 2.5 MG PO TABS: 2.5000 mg | ORAL_TABLET | Freq: Every day | ORAL | 3 refills | Status: DC

## 2020-11-30 NOTE — Assessment & Plan Note (Signed)
Rt Lumpectomy 07/16/15: IDC grade 2, 0.6 cm, with DCIS, 0/4 LN, ER 95%, PR 90% HER-2 negative ratio 1.70, KI 67:15%, Path stage: T1aN0 Stage 1A Oncotype DX score 19, 12% risk of recurrence Status post radiation 09/08/2015 to 10/26/2015  Current treatment: Anastrozole 1 mg daily 5 years started 11/01/2015 stopped March 2018, switched to letrozole 04/16/2018because of continuing hot flashes I sent a prescription for exemestane(did not make a difference). she wentback to letrozole.  Letrozole toxicities: 1.Hot flashes 10-15 times per day associated with sweating previously Effexor did not work. She is currently on gabapentin for migraine prevention but it does not seem to reduce hot flashes. 2.myalgias arthralgias markedly better with letrozole.  Bone density done February 2019: T score -0.2 A.Fib on Eliquis Vaginal Dryness:  Breast cancer surveillance: 1.Breast exam  11/30/2020: No abnormalities of concern 2.Mammograms at [facility]5/07/2020: Benign, breast density category A  Return to clinic in1 yearfor follow-up.

## 2020-12-01 ENCOUNTER — Ambulatory Visit: Payer: Medicare PPO | Admitting: Cardiovascular Disease

## 2020-12-01 ENCOUNTER — Encounter: Payer: Self-pay | Admitting: Cardiovascular Disease

## 2020-12-01 VITALS — BP 132/88 | HR 97 | Ht 68.0 in | Wt 247.0 lb

## 2020-12-01 DIAGNOSIS — I4811 Longstanding persistent atrial fibrillation: Secondary | ICD-10-CM

## 2020-12-01 DIAGNOSIS — I2721 Secondary pulmonary arterial hypertension: Secondary | ICD-10-CM

## 2020-12-01 DIAGNOSIS — Z7901 Long term (current) use of anticoagulants: Secondary | ICD-10-CM

## 2020-12-01 MED ORDER — APIXABAN 5 MG PO TABS: 5.0000 mg | ORAL_TABLET | Freq: Two times a day (BID) | ORAL | 3 refills | Status: DC

## 2020-12-01 NOTE — Patient Instructions (Signed)

## 2020-12-01 NOTE — Progress Notes (Signed)
Cardiology Office Note    Date:  12/02/2020   ID:  Brandi Rodgers, DOB 1950/10/01, MRN [id number]  PCP:  Jonathon Jordan, MD  Cardiologist:   Sanda Klein, MD   Chief complaint: Atrial fibrillation   History of Present Illness:  Brandi Rodgers is a 70 y.o. female with persistent atrial fibrillation , migraine headaches for which she takes atenolol , on anticoagulation with Eliquis without bleeding complications, morbid obesity, sleep apnea (uses a jaw advancement device).  She feels well.  She is again in atrial fibrillation today and does not have palpitations.  She is borderline well rate controlled with a heart rate of 97.  She believes this is unusual for her (usually her heart rate is in the 70s), but she had a couple of glasses of tea before this appointment at a family lunch party.  She has lost 16 pounds in the last year and is back at [employer], plans to lose a lot more weight.  This is her third yearly appointment in a row where she presents with atrial fibrillation which is probably now a permanent arrhythmia.  She is never aware of it.  The patient specifically denies any chest pain at rest exertion, dyspnea at rest or with exertion, orthopnea, paroxysmal nocturnal dyspnea, syncope, hypersomnolence, focal neurological deficits, intermittent claudication, lower extremity edema, unexplained weight gain, cough, hemoptysis or wheezing.  She has not had any falls, serious injuries or bleeding.   Echocardiogram performed in October 2015 showed normal left ventricular wall thickness and ejection fraction, normal left atrial size, but did show mild pulmonary hypertension with an estimated systolic PA pressure 41 mmHg. Has a history of right breast cancer status post surgery and right breast radiation therapy. She does not have angina pectoris or any known coronary or peripheral vascular obstructive lesions. She has mild hyperlipidemia which has been managed with diet alone.  She has had problems with edema in the past, but uses furosemide extremely rarely.   Past Medical History:  Diagnosis Date   Arthritis    cervical and lumbar spine   Breast cancer of lower-outer quadrant of right female breast ([place]) 07/02/2015   Degenerative lumbar disc    Dental crowns present    GERD (gastroesophageal reflux disease)    Hypercholesterolemia    borderline - no current med.   Jaw clicking    Migraines    Osteoarthritis    bilateral knee   Paroxysmal atrial fibrillation ([place])    last episode was 01/2014   Radiation 09/07/15-10/26/15   right breast 50.4 Gy, boost to 10 Gy   Sleep apnea    uses mouth guard at night; no CPAP    Past Surgical History:  Procedure Laterality Date   CARPAL TUNNEL RELEASE Right 05/13/2003   DILATION AND CURETTAGE OF UTERUS     KNEE ARTHROSCOPY Right 04/06/2003   KNEE ARTHROSCOPY Left 11/30/2003   RADIOACTIVE SEED GUIDED PARTIAL MASTECTOMY WITH AXILLARY SENTINEL LYMPH NODE BIOPSY Right 07/16/2015   Procedure: RADIOACTIVE SEED GUIDED PARTIAL MASTECTOMY WITH AXILLARY SENTINEL LYMPH NODE BIOPSY;  Surgeon: [name], MD;  Location: [facility];  Service: General;  Laterality: Right;   TOTAL KNEE ARTHROPLASTY Left 03/23/2014   Procedure: LEFT TOTAL KNEE ARTHROPLASTY ;  Surgeon: [name], MD;  Location: [facility];  Service: Orthopedics;  Laterality: Left;   TOTAL KNEE ARTHROPLASTY Right 08/21/2014   Procedure: RIGHT TOTAL KNEE ARTHROPLASTY;  Surgeon: Gaynelle Arabian, MD;  Location: [facility];  Service: Orthopedics;  Laterality: Right;    Current Medications: Outpatient Medications Prior to Visit  Medication Sig Dispense Refill   acetaminophen (TYLENOL) 325 MG tablet as needed.     atenolol (TENORMIN) 50 MG tablet Take 50 mg by mouth daily.      bismuth subsalicylate (PEPTO BISMOL) 262 MG chewable tablet Chew 524 mg by mouth as needed.     Calcium-Vitamin D-Vitamin K (VIACTIV PO) Take 1 tablet by mouth daily.     gabapentin  (NEURONTIN) 600 MG tablet Take 1 tablet (600 mg total) by mouth 2 (two) times daily.     letrozole (FEMARA) 2.5 MG tablet Take 1 tablet (2.5 mg total) by mouth daily. 90 tablet 3   Multiple Vitamin (MULTIVITAMIN) tablet Take 1 tablet by mouth daily.     rizatriptan (MAXALT-MLT) 10 MG disintegrating tablet Take 10 mg by mouth as needed for migraine. Reported on 09/14/2015     ELIQUIS 5 MG TABS tablet TAKE 1 TABLET BY MOUTH TWICE A DAY 180 tablet 1   No facility-administered medications prior to visit.     Allergies:   Adhesive [tape] and Methocarbamol   Social History   Socioeconomic History   Marital status: Married    Spouse name: Not on file   Number of children: Not on file   Years of education: Not on file   Highest education level: Not on file  Occupational History   Not on file  Tobacco Use   Smoking status: Never   Smokeless tobacco: Never  Substance and Sexual Activity   Alcohol use: Yes    Comment: occasionally   Drug use: No   Sexual activity: Yes  Other Topics Concern   Not on file  Social History Narrative   Not on file   Social Determinants of Health   Financial Resource Strain: Not on file  Food Insecurity: Not on file  Transportation Needs: Not on file  Physical Activity: Not on file  Stress: Not on file  Social Connections: Not on file     Family History:  The patient's family history includes Lung cancer in her mother; Thyroid cancer in her mother.   ROS:   Please see the history of present illness.    ROS all other systems are reviewed and are negative   PHYSICAL EXAM:   VS:  BP 132/88 (BP Location: Left Arm, Patient Position: Sitting, Cuff Size: Large)   Pulse 97   Ht 5\' 8"  (1.727 m)   Wt 247 lb (112 kg)   SpO2 96%   BMI 37.56 kg/m     General: Alert, oriented x3, no distress, moderate to severely obese Head: no evidence of trauma, PERRL, EOMI, no exophtalmos or lid lag, no myxedema, no xanthelasma; normal ears, nose and oropharynx Neck:  normal jugular venous pulsations and no hepatojugular reflux; brisk carotid pulses without delay and no carotid bruits Chest: clear to auscultation, no signs of consolidation by percussion or palpation, normal fremitus, symmetrical and full respiratory excursions Cardiovascular: normal position and quality of the apical impulse, regular rhythm, normal first and second heart sounds, no murmurs, rubs or gallops Abdomen: no tenderness or distention, no masses by palpation, no abnormal pulsatility or arterial bruits, normal bowel sounds, no hepatosplenomegaly Extremities: no clubbing, cyanosis or edema; 2+ radial, ulnar and brachial pulses bilaterally; 2+ right femoral, posterior tibial and dorsalis pedis pulses; 2+ left femoral, posterior tibial and dorsalis pedis pulses; no subclavian or femoral bruits Neurological: grossly nonfocal Psych: Normal mood and affect   Wt Readings  from Last 3 Encounters:  12/01/20 247 lb (112 kg)  11/30/20 244 lb 12.8 oz (111 kg)  12/08/19 (!) 261 lb 4.8 oz (118.5 kg)      Studies/Labs Reviewed:   EKG:  EKG is ordered today.  It shows atrial fibrillation with controlled ventricular rate and is otherwise normal.  QTc 398 ms. Recent Labs: No results found for requested labs within last 8760 hours.   01/25/2018 Hemoglobin 14.3, creatinine 0.7, potassium 4.2, normal liver function test, TSH 0.5 10/22/2019 Hemoglobin 14.6, creatinine 0.75, potassium 4.1, ALT 16, TSH 1.99  Lipid Panel  No results found for: CHOL, TRIG, HDL, CHOLHDL, VLDL, LDLCALC, LDLDIRECT   01/25/2018 Total cholesterol 181, HDL 52, LDL 106, triglycerides 116 10/22/2019 Cholesterol 159, HDL 46, LDL 93, triglycerides 116  ASSESSMENT:    1. Longstanding persistent atrial fibrillation ([place])   2. Long term current use of anticoagulant   3. Severe obesity (BMI 35.0-39.9) with comorbidity ([place])   4. PAH (pulmonary artery hypertension) ([facility])      PLAN:  In order of problems listed  above:  AFib: Asymptomatic long-standing persistent atrial fibrillation that does not require cardioversion, antiarrhythmics or ablation.  Continue anticoagulation. Eliquis: CHA2DS2-VASc score of 2 (age and gender).  Brief interruptions of anticoagulation for surgical procedures should be quite safe.  Has not had any bleeding problems. Moderate obesity: Congratulated her on continued success with weight loss.  Remains moderate to severely obese.  She plans to lose substantial more weight. PAH: Mild on previous echo.  No symptoms of dyspnea, no evidence of right heart failure.  Sleep apnea appears to be appropriately treated as judged by the absence of daytime hypersomnolence.  Consider repeat echocardiogram if she develops shortness of breath.    Medication Adjustments/Labs and Tests Ordered: Current medicines are reviewed at length with the patient today.  Concerns regarding medicines are outlined above.  Medication changes, Labs and Tests ordered today are listed in the Patient Instructions below. Patient Instructions  Medication Instructions:  No changes *If you need a refill on your cardiac medications before your next appointment, please call your pharmacy*   Lab Work: None ordered If you have labs (blood work) drawn today and your tests are completely normal, you will receive your results only by: [place] (if you have MyChart) OR A paper copy in the mail If you have any lab test that is abnormal or we need to change your treatment, we will call you to review the results.   Testing/Procedures: None ordered   Follow-Up: At [facility], you and your health needs are our priority.  As part of our continuing mission to provide you with exceptional heart care, we have created designated Provider Care Teams.  These Care Teams include your primary Cardiologist (physician) and Advanced Practice Providers (APPs -  Physician Assistants and Nurse Practitioners) who all work  together to provide you with the care you need, when you need it.  We recommend signing up for the patient portal called "MyChart".  Sign up information is provided on this After Visit Summary.  MyChart is used to connect with patients for Virtual Visits (Telemedicine).  Patients are able to view lab/test results, encounter notes, upcoming appointments, etc.  Non-urgent messages can be sent to your provider as well.   To learn more about what you can do with MyChart, go to [email or web address].    Your next appointment:   12 month(s)  The format for your next appointment:   In Person  Provider:  Sanda Klein, MD    Signed, Sanda Klein, MD  12/02/2020 3:12 PM    [facility] [address], [place], [place]  [address] Phone: [phone]; Fax: [phone]

## 2020-12-07 ENCOUNTER — Ambulatory Visit: Payer: Medicare PPO | Admitting: Hematology and Oncology

## 2020-12-08 DIAGNOSIS — K921 Melena: Secondary | ICD-10-CM | POA: Diagnosis not present

## 2020-12-08 DIAGNOSIS — Z0000 Encounter for general adult medical examination without abnormal findings: Secondary | ICD-10-CM | POA: Diagnosis not present

## 2020-12-08 DIAGNOSIS — E559 Vitamin D deficiency, unspecified: Secondary | ICD-10-CM | POA: Diagnosis not present

## 2020-12-08 DIAGNOSIS — E785 Hyperlipidemia, unspecified: Secondary | ICD-10-CM | POA: Diagnosis not present

## 2020-12-08 DIAGNOSIS — I10 Essential (primary) hypertension: Secondary | ICD-10-CM | POA: Diagnosis not present

## 2020-12-08 DIAGNOSIS — Z79899 Other long term (current) drug therapy: Secondary | ICD-10-CM | POA: Diagnosis not present

## 2020-12-08 DIAGNOSIS — I4821 Permanent atrial fibrillation: Secondary | ICD-10-CM | POA: Diagnosis not present

## 2020-12-08 DIAGNOSIS — C50911 Malignant neoplasm of unspecified site of right female breast: Secondary | ICD-10-CM | POA: Diagnosis not present

## 2020-12-10 DIAGNOSIS — D229 Melanocytic nevi, unspecified: Secondary | ICD-10-CM | POA: Diagnosis not present

## 2020-12-10 DIAGNOSIS — L905 Scar conditions and fibrosis of skin: Secondary | ICD-10-CM | POA: Diagnosis not present

## 2020-12-10 DIAGNOSIS — L84 Corns and callosities: Secondary | ICD-10-CM | POA: Diagnosis not present

## 2020-12-10 DIAGNOSIS — L821 Other seborrheic keratosis: Secondary | ICD-10-CM | POA: Diagnosis not present

## 2020-12-10 DIAGNOSIS — L814 Other melanin hyperpigmentation: Secondary | ICD-10-CM | POA: Diagnosis not present

## 2020-12-10 DIAGNOSIS — I8393 Asymptomatic varicose veins of bilateral lower extremities: Secondary | ICD-10-CM | POA: Diagnosis not present

## 2020-12-10 DIAGNOSIS — L812 Freckles: Secondary | ICD-10-CM | POA: Diagnosis not present

## 2020-12-10 DIAGNOSIS — L819 Disorder of pigmentation, unspecified: Secondary | ICD-10-CM | POA: Diagnosis not present

## 2020-12-10 DIAGNOSIS — D1801 Hemangioma of skin and subcutaneous tissue: Secondary | ICD-10-CM | POA: Diagnosis not present

## 2020-12-13 ENCOUNTER — Ambulatory Visit: Payer: Medicare PPO | Admitting: Hematology and Oncology

## 2020-12-13 DIAGNOSIS — K921 Melena: Secondary | ICD-10-CM | POA: Diagnosis not present

## 2020-12-19 DIAGNOSIS — J209 Acute bronchitis, unspecified: Secondary | ICD-10-CM | POA: Diagnosis not present

## 2020-12-19 DIAGNOSIS — R062 Wheezing: Secondary | ICD-10-CM | POA: Diagnosis not present

## 2020-12-19 DIAGNOSIS — R519 Headache, unspecified: Secondary | ICD-10-CM | POA: Diagnosis not present

## 2020-12-19 DIAGNOSIS — R0981 Nasal congestion: Secondary | ICD-10-CM | POA: Diagnosis not present

## 2020-12-19 DIAGNOSIS — R051 Acute cough: Secondary | ICD-10-CM | POA: Diagnosis not present

## 2021-01-05 ENCOUNTER — Ambulatory Visit: Payer: Medicare PPO | Admitting: Cardiovascular Disease

## 2021-01-13 DIAGNOSIS — N3000 Acute cystitis without hematuria: Secondary | ICD-10-CM | POA: Diagnosis not present

## 2021-01-13 DIAGNOSIS — Z23 Encounter for immunization: Secondary | ICD-10-CM | POA: Diagnosis not present

## 2021-01-13 DIAGNOSIS — J209 Acute bronchitis, unspecified: Secondary | ICD-10-CM | POA: Diagnosis not present

## 2021-01-13 DIAGNOSIS — G43709 Chronic migraine without aura, not intractable, without status migrainosus: Secondary | ICD-10-CM | POA: Diagnosis not present

## 2021-01-31 DIAGNOSIS — Z6838 Body mass index (BMI) 38.0-38.9, adult: Secondary | ICD-10-CM | POA: Diagnosis not present

## 2021-01-31 DIAGNOSIS — E785 Hyperlipidemia, unspecified: Secondary | ICD-10-CM | POA: Diagnosis not present

## 2021-01-31 DIAGNOSIS — G43909 Migraine, unspecified, not intractable, without status migrainosus: Secondary | ICD-10-CM | POA: Diagnosis not present

## 2021-01-31 DIAGNOSIS — G4733 Obstructive sleep apnea (adult) (pediatric): Secondary | ICD-10-CM | POA: Diagnosis not present

## 2021-01-31 DIAGNOSIS — I10 Essential (primary) hypertension: Secondary | ICD-10-CM | POA: Diagnosis not present

## 2021-01-31 DIAGNOSIS — C50919 Malignant neoplasm of unspecified site of unspecified female breast: Secondary | ICD-10-CM | POA: Diagnosis not present

## 2021-01-31 DIAGNOSIS — D6869 Other thrombophilia: Secondary | ICD-10-CM | POA: Diagnosis not present

## 2021-01-31 DIAGNOSIS — I4891 Unspecified atrial fibrillation: Secondary | ICD-10-CM | POA: Diagnosis not present

## 2021-02-24 DIAGNOSIS — R58 Hemorrhage, not elsewhere classified: Secondary | ICD-10-CM | POA: Diagnosis not present

## 2021-02-24 DIAGNOSIS — I8312 Varicose veins of left lower extremity with inflammation: Secondary | ICD-10-CM | POA: Diagnosis not present

## 2021-02-24 DIAGNOSIS — I8311 Varicose veins of right lower extremity with inflammation: Secondary | ICD-10-CM | POA: Diagnosis not present

## 2021-04-15 DIAGNOSIS — Z09 Encounter for follow-up examination after completed treatment for conditions other than malignant neoplasm: Secondary | ICD-10-CM | POA: Diagnosis not present

## 2021-04-15 DIAGNOSIS — I83811 Varicose veins of right lower extremities with pain: Secondary | ICD-10-CM | POA: Diagnosis not present

## 2021-04-15 DIAGNOSIS — I83891 Varicose veins of right lower extremities with other complications: Secondary | ICD-10-CM | POA: Diagnosis not present

## 2021-04-19 DIAGNOSIS — I83892 Varicose veins of left lower extremities with other complications: Secondary | ICD-10-CM | POA: Diagnosis not present

## 2021-04-22 DIAGNOSIS — I8002 Phlebitis and thrombophlebitis of superficial vessels of left lower extremity: Secondary | ICD-10-CM | POA: Diagnosis not present

## 2021-04-27 DIAGNOSIS — I83892 Varicose veins of left lower extremities with other complications: Secondary | ICD-10-CM | POA: Diagnosis not present

## 2021-04-27 DIAGNOSIS — I83812 Varicose veins of left lower extremities with pain: Secondary | ICD-10-CM | POA: Diagnosis not present

## 2021-04-27 DIAGNOSIS — Z09 Encounter for follow-up examination after completed treatment for conditions other than malignant neoplasm: Secondary | ICD-10-CM | POA: Diagnosis not present

## 2021-04-29 DIAGNOSIS — M79604 Pain in right leg: Secondary | ICD-10-CM | POA: Diagnosis not present

## 2021-04-29 DIAGNOSIS — I87391 Chronic venous hypertension (idiopathic) with other complications of right lower extremity: Secondary | ICD-10-CM | POA: Diagnosis not present

## 2021-04-29 DIAGNOSIS — I83891 Varicose veins of right lower extremities with other complications: Secondary | ICD-10-CM | POA: Diagnosis not present

## 2021-05-17 DIAGNOSIS — I83892 Varicose veins of left lower extremities with other complications: Secondary | ICD-10-CM | POA: Diagnosis not present

## 2021-05-17 DIAGNOSIS — M7989 Other specified soft tissue disorders: Secondary | ICD-10-CM | POA: Diagnosis not present

## 2021-05-17 DIAGNOSIS — I872 Venous insufficiency (chronic) (peripheral): Secondary | ICD-10-CM | POA: Diagnosis not present

## 2021-05-25 DIAGNOSIS — I83811 Varicose veins of right lower extremities with pain: Secondary | ICD-10-CM | POA: Diagnosis not present

## 2021-05-25 DIAGNOSIS — I83891 Varicose veins of right lower extremities with other complications: Secondary | ICD-10-CM | POA: Diagnosis not present

## 2021-05-30 DIAGNOSIS — M7061 Trochanteric bursitis, right hip: Secondary | ICD-10-CM | POA: Diagnosis not present

## 2021-05-31 DIAGNOSIS — M25551 Pain in right hip: Secondary | ICD-10-CM | POA: Insufficient documentation

## 2021-06-01 DIAGNOSIS — I83892 Varicose veins of left lower extremities with other complications: Secondary | ICD-10-CM | POA: Diagnosis not present

## 2021-06-01 DIAGNOSIS — I87392 Chronic venous hypertension (idiopathic) with other complications of left lower extremity: Secondary | ICD-10-CM | POA: Diagnosis not present

## 2021-06-15 DIAGNOSIS — I83891 Varicose veins of right lower extremities with other complications: Secondary | ICD-10-CM | POA: Diagnosis not present

## 2021-06-15 DIAGNOSIS — M7989 Other specified soft tissue disorders: Secondary | ICD-10-CM | POA: Diagnosis not present

## 2021-06-15 DIAGNOSIS — I83811 Varicose veins of right lower extremities with pain: Secondary | ICD-10-CM | POA: Diagnosis not present

## 2021-07-07 DIAGNOSIS — M5451 Vertebrogenic low back pain: Secondary | ICD-10-CM | POA: Diagnosis not present

## 2021-07-07 DIAGNOSIS — M47816 Spondylosis without myelopathy or radiculopathy, lumbar region: Secondary | ICD-10-CM | POA: Diagnosis not present

## 2021-07-23 DIAGNOSIS — M47816 Spondylosis without myelopathy or radiculopathy, lumbar region: Secondary | ICD-10-CM | POA: Diagnosis not present

## 2021-08-10 NOTE — Progress Notes (Signed)
?[facility] Note ? ?08/15/2021 ? ?  ? ?CHIEF COMPLAINT ?Patient presents for Retina Follow Up ? ? ?HISTORY OF PRESENT ILLNESS: ?Brandi Rodgers is a 71 y.o. female who presents to the clinic today for:  ? ?HPI   ? ? Retina Follow Up   ?Patient presents with  Other.  In right eye.  This started 1 year ago.  I, the attending physician,  performed the HPI with the patient and updated documentation appropriately. ? ?  ?  ? ? Comments   ?Patient here for 1 year retina follow up for ERM OD. Patient states vision doing good. No eye pain.  ? ?  ?  ?Last edited by Bernarda Caffey, MD on 08/17/2021 12:48 AM.  ?  ? ?Patient states vision doing good. ? ?Referring physician: ?Jonathon Jordan, MD ?[address] ?[address] ?[place],  [place] [address] ? ?HISTORICAL INFORMATION:  ? ?Selected notes from the [facility] ?Referred by Dr. Vevelyn Royals for concern of ERM OD  ? ?CURRENT MEDICATIONS: ?No current outpatient medications on file. (Ophthalmic Drugs)  ? ?No current facility-administered medications for this visit. (Ophthalmic Drugs)  ? ?Current Outpatient Medications (Other)  ?Medication Sig  ? acetaminophen (TYLENOL) 325 MG tablet as needed.  ? apixaban (ELIQUIS) 5 MG TABS tablet Take 1 tablet (5 mg total) by mouth 2 (two) times daily.  ? atenolol (TENORMIN) 50 MG tablet Take 50 mg by mouth daily.   ? bismuth subsalicylate (PEPTO BISMOL) 262 MG chewable tablet Chew 524 mg by mouth as needed.  ? Calcium-Vitamin D-Vitamin K (VIACTIV PO) Take 1 tablet by mouth daily.  ? gabapentin (NEURONTIN) 600 MG tablet Take 1 tablet (600 mg total) by mouth 2 (two) times daily.  ? letrozole (FEMARA) 2.5 MG tablet Take 1 tablet (2.5 mg total) by mouth daily.  ? Multiple Vitamin (MULTIVITAMIN) tablet Take 1 tablet by mouth daily.  ? rizatriptan (MAXALT-MLT) 10 MG disintegrating tablet Take 10 mg by mouth as needed for migraine. Reported on 09/14/2015  ? ?No current facility-administered medications for  this visit. (Other)  ? ?REVIEW OF SYSTEMS: ?ROS   ?Positive for: Musculoskeletal, Eyes ?Negative for: Constitutional, Gastrointestinal, Neurological, Skin, Genitourinary, HENT, Endocrine, Cardiovascular, Respiratory, Psychiatric, Allergic/Imm, Heme/Lymph ?Last edited by Theodore Demark, COA on 08/15/2021  9:38 AM.  ?  ? ?ALLERGIES ?Allergies  ?Allergen Reactions  ? Adhesive [Tape] Other (See Comments)  ?  BLISTERS  ? Methocarbamol Hives  ? ?PAST MEDICAL HISTORY ?Past Medical History:  ?Diagnosis Date  ? Arthritis   ? cervical and lumbar spine  ? Breast cancer of lower-outer quadrant of right female breast ([place]) 07/02/2015  ? Degenerative lumbar disc   ? Dental crowns present   ? GERD (gastroesophageal reflux disease)   ? Hypercholesterolemia   ? borderline - no current med.  ? Jaw clicking   ? Migraines   ? Osteoarthritis   ? bilateral knee  ? Paroxysmal atrial fibrillation (HCC)   ? last episode was 01/2014  ? Radiation 09/07/15-10/26/15  ? right breast 50.4 Gy, boost to 10 Gy  ? Sleep apnea   ? uses mouth guard at night; no CPAP  ? ?Past Surgical History:  ?Procedure Laterality Date  ? CARPAL TUNNEL RELEASE Right 05/13/2003  ? DILATION AND CURETTAGE OF UTERUS    ? KNEE ARTHROSCOPY Right 04/06/2003  ? KNEE ARTHROSCOPY Left 11/30/2003  ? RADIOACTIVE SEED GUIDED PARTIAL MASTECTOMY WITH AXILLARY SENTINEL LYMPH NODE BIOPSY Right 07/16/2015  ?  Procedure: RADIOACTIVE SEED GUIDED PARTIAL MASTECTOMY WITH AXILLARY SENTINEL LYMPH NODE BIOPSY;  Surgeon: [name], MD;  Location: [facility];  Service: General;  Laterality: Right;  ? TOTAL KNEE ARTHROPLASTY Left 03/23/2014  ? Procedure: LEFT TOTAL KNEE ARTHROPLASTY ;  Surgeon: [name], MD;  Location: [facility];  Service: Orthopedics;  Laterality: Left;  ? TOTAL KNEE ARTHROPLASTY Right 08/21/2014  ? Procedure: RIGHT TOTAL KNEE ARTHROPLASTY;  Surgeon: Gaynelle Arabian, MD;  Location: [facility];  Service: Orthopedics;  Laterality: Right;  ? ?FAMILY HISTORY ?Family History   ?Problem Relation Age of Onset  ? Lung cancer Mother   ? Thyroid cancer Mother   ? ?SOCIAL HISTORY ?Social History  ? ?Tobacco Use  ? Smoking status: Never  ? Smokeless tobacco: Never  ?Vaping Use  ? Vaping Use: Never used  ?Substance Use Topics  ? Alcohol use: Yes  ?  Comment: occasionally  ? Drug use: No  ?  ? ?  ?OPHTHALMIC EXAM: ? ?Base Eye Exam   ? ? Visual Acuity (Snellen - Linear)   ? ?   Right Left  ? Dist [place] 20/30 -2 20/20  ? Dist ph [place] 20/30 +2   ? ?  ?  ? ? Tonometry (Tonopen, 9:36 AM)   ? ?   Right Left  ? Pressure 14 12  ? ?  ?  ? ? Pupils   ? ?   Dark Light Shape React APD  ? Right 4 2 Round Brisk None  ? Left 4 2 Round Brisk None  ? ?  ?  ? ? Visual Fields (Counting fingers)   ? ?   Left Right  ?  Full Full  ? ?  ?  ? ? Extraocular Movement   ? ?   Right Left  ?  Full, Ortho Full, Ortho  ? ?  ?  ? ? Neuro/Psych   ? ? Oriented x3: Yes  ? Mood/Affect: Normal  ? ?  ?  ? ? Dilation   ? ? Both eyes: 1.0% Mydriacyl, 2.5% Phenylephrine @ 9:36 AM  ? ?  ?  ? ?  ? ?Slit Lamp and Fundus Exam   ? ? Slit Lamp Exam   ? ?   Right Left  ? Lids/Lashes Dermatochalasis - upper lid Dermatochalasis - upper lid  ? Conjunctiva/Sclera White and quiet mild temporal Pinguecula  ? Cornea 1+ Punctate epithelial erosions, mild Arcus, well healed cataract wound, tear film debris 1+ Punctate epithelial erosions inferiorly, mild tear film debris, mild Arcus, well healed cataract wound  ? Anterior Chamber Deep and quiet Deep and quiet  ? Iris Round and dilated Round and dilated  ? Lens PC IOL in perfect position (vivity), open PC  PC IOL in excellent postion (viviity), open PC  ? Anterior Vitreous Vitreous syneresis, Posterior vitreous detachment, mild Asteroid hyalosis inferiorly Vitreous syneresis  ? ?  ?  ? ? Fundus Exam   ? ?   Right Left  ? Disc Trace pallor, sharp rim, mild temporal PPA Compact, Pink and Sharp, mild temporal PPA  ? C/D Ratio 0.5 0.6  ? Macula Flat, Blunted foveal reflex, Epiretinal membrane with mild  striae--stable, no heme Flat, Blunted foveal reflex, trace ERM SN mac, mild RPE mottling, No heme or edema  ? Vessels Vascular attenuation, Tortuous, mild av crossing changes Mild Vascular attenuation, mild tortuosity  ? Periphery Attached, reticular degeneration, paving stone degeneration inferior and temporal periphery, No RT/RD, No heme  Attached, reticular  degeneration, paving stone degeneration inferior and temporal periphery, No RT/RD, No heme   ? ?  ?  ? ?  ? ?Refraction   ? ? Wearing Rx   ? ?   Sphere  ? Right NONE  ? Left NONE  ? ?  ?  ? ?  ? ?IMAGING AND PROCEDURES  ?Imaging and Procedures for _0 @ ? ?OCT, Retina - OU - Both Eyes   ? ?   ?Right Eye ?Quality was good. Central Foveal Thickness: 375. Progression has been stable. Findings include abnormal foveal contour, no IRF, no SRF, epiretinal membrane, macular pucker (Stable ERM).  ? ?Left Eye ?Quality was good. Central Foveal Thickness: 272. Progression has been stable. Findings include normal foveal contour, no IRF, no SRF (Trace ERM superiorly--stable).  ? ?Notes ?*Images captured and stored on drive ? ?Diagnosis / Impression:  ?OD: +ERM with pucker--stable from prior ?OS: NFP, no IRF/SRF--stable ? ?Clinical management:  ?See below ? ?Abbreviations: NFP - Normal foveal profile. CME - cystoid macular edema. PED - pigment epithelial detachment. IRF - intraretinal fluid. SRF - subretinal fluid. EZ - ellipsoid zone. ERM - epiretinal membrane. ORA - outer retinal atrophy. ORT - outer retinal tubulation. SRHM - subretinal hyper-reflective material ? ? ?  ?  ?  ? ?  ?ASSESSMENT/PLAN: ? ?  ICD-10-CM   ?1. Epiretinal membrane (ERM) of right eye  H35.371 OCT, Retina - OU - Both Eyes  ?  ?2. Essential hypertension  I10   ?  ?3. Hypertensive retinopathy of both eyes  H35.033   ?  ?4. Pseudophakia of both eyes  Z96.1   ?  ? ?1. Epiretinal membrane, OD ? - mild ERM--no significant change from prior ? - BCVA 20/30+2 OD ? - asymptomatic, no metamorphopsia ? - no  indication for surgery at this time ? - monitor for now ? - had CE with IOL OU w/ [facility] (OD: 05.18.21, OS: 05.25.21), vision improved and pt very happy ? - f/u 12 months, sooner prn -- DFE/OCT ? ?2,3. Hyp

## 2021-08-15 ENCOUNTER — Encounter (INDEPENDENT_AMBULATORY_CARE_PROVIDER_SITE_OTHER): Payer: Self-pay | Admitting: Ophthalmology

## 2021-08-15 ENCOUNTER — Ambulatory Visit (INDEPENDENT_AMBULATORY_CARE_PROVIDER_SITE_OTHER): Payer: Medicare PPO | Admitting: Ophthalmology

## 2021-08-15 DIAGNOSIS — Z961 Presence of intraocular lens: Secondary | ICD-10-CM | POA: Diagnosis not present

## 2021-08-15 DIAGNOSIS — H35371 Puckering of macula, right eye: Secondary | ICD-10-CM | POA: Diagnosis not present

## 2021-08-15 DIAGNOSIS — H35033 Hypertensive retinopathy, bilateral: Secondary | ICD-10-CM | POA: Diagnosis not present

## 2021-08-15 DIAGNOSIS — I10 Essential (primary) hypertension: Secondary | ICD-10-CM | POA: Diagnosis not present

## 2021-08-17 ENCOUNTER — Encounter (INDEPENDENT_AMBULATORY_CARE_PROVIDER_SITE_OTHER): Payer: Self-pay | Admitting: Ophthalmology

## 2021-09-12 DIAGNOSIS — I83893 Varicose veins of bilateral lower extremities with other complications: Secondary | ICD-10-CM | POA: Diagnosis not present

## 2021-09-12 DIAGNOSIS — I872 Venous insufficiency (chronic) (peripheral): Secondary | ICD-10-CM | POA: Diagnosis not present

## 2021-09-12 DIAGNOSIS — R600 Localized edema: Secondary | ICD-10-CM | POA: Diagnosis not present

## 2021-09-19 ENCOUNTER — Encounter: Payer: Self-pay | Admitting: Hematology and Oncology

## 2021-09-19 DIAGNOSIS — Z1231 Encounter for screening mammogram for malignant neoplasm of breast: Secondary | ICD-10-CM | POA: Diagnosis not present

## 2021-11-09 DIAGNOSIS — J0140 Acute pansinusitis, unspecified: Secondary | ICD-10-CM | POA: Diagnosis not present

## 2021-11-09 DIAGNOSIS — R0982 Postnasal drip: Secondary | ICD-10-CM | POA: Diagnosis not present

## 2021-11-09 DIAGNOSIS — I10 Essential (primary) hypertension: Secondary | ICD-10-CM | POA: Diagnosis not present

## 2021-11-09 DIAGNOSIS — R509 Fever, unspecified: Secondary | ICD-10-CM | POA: Diagnosis not present

## 2021-11-09 DIAGNOSIS — Z03818 Encounter for observation for suspected exposure to other biological agents ruled out: Secondary | ICD-10-CM | POA: Diagnosis not present

## 2021-11-09 DIAGNOSIS — R051 Acute cough: Secondary | ICD-10-CM | POA: Diagnosis not present

## 2021-11-11 DIAGNOSIS — J209 Acute bronchitis, unspecified: Secondary | ICD-10-CM | POA: Diagnosis not present

## 2021-11-11 DIAGNOSIS — U071 COVID-19: Secondary | ICD-10-CM | POA: Diagnosis not present

## 2021-11-11 DIAGNOSIS — Z20822 Contact with and (suspected) exposure to covid-19: Secondary | ICD-10-CM | POA: Diagnosis not present

## 2021-11-16 DIAGNOSIS — Z03818 Encounter for observation for suspected exposure to other biological agents ruled out: Secondary | ICD-10-CM | POA: Diagnosis not present

## 2021-11-16 DIAGNOSIS — B001 Herpesviral vesicular dermatitis: Secondary | ICD-10-CM | POA: Diagnosis not present

## 2021-11-16 DIAGNOSIS — Z20822 Contact with and (suspected) exposure to covid-19: Secondary | ICD-10-CM | POA: Diagnosis not present

## 2021-11-23 ENCOUNTER — Telehealth: Payer: Self-pay | Admitting: Hematology and Oncology

## 2021-11-23 NOTE — Telephone Encounter (Signed)
Rescheduled appointment per provider covering [facility]. Patient is aware of the changes made to her upcoming appointment. 

## 2021-11-30 ENCOUNTER — Ambulatory Visit: Payer: Medicare PPO | Admitting: Hematology and Oncology

## 2021-12-05 DIAGNOSIS — Z780 Asymptomatic menopausal state: Secondary | ICD-10-CM | POA: Diagnosis not present

## 2021-12-05 NOTE — Progress Notes (Signed)
Patient Care Team: Jonathon Jordan, MD as PCP - General (Family Medicine) Sanda Klein, MD as PCP - Cardiology (Cardiology) Sylvan Cheese, NP as Nurse Practitioner (Hematology and Oncology)  DIAGNOSIS: No diagnosis found.  SUMMARY OF ONCOLOGIC HISTORY: Oncology History  Breast cancer of lower-outer quadrant of right female breast ([place])  06/25/2015 Initial Diagnosis   Right breast screening det focal asymmetry plus calcs 4 mm at 8:00 axilla neg, 3-D biopsy: Grade 1 IDC with DCIS and calcifications, ER 95%, PR 90% HER-2 negative ratio 1.70, KI 67:15%, clinical stage: T1aN0 Stage 1A    07/16/2015 Surgery   Rt Lumpectomy [facility]): IDC grade 2, 0.6 cm, with DCIS, 0/4 LN, ER 95%, PR 90% HER-2 negative ratio 1.70, KI 67:15%, Path stage: T1aN0 Stage 1A , Oncotype DX score 19, 12% ROR   09/07/2015 - 10/26/2015 Radiation Therapy   Adjuvant radiation therapy ([place]). Right breast treated to 50.4 Gy in 28 fractions.  Right breast boost treated to 10 Gy in 5 fractions    11/01/2015 -  Anti-estrogen oral therapy   Anastrozole 1 mg daily stopped March 2018 due to severe hot flashes switched to letrozole 08/28/2016 switched to exemestane 11/27/2017     CHIEF COMPLIANT: Follow-up of right breast cancer on letrozole therapy  INTERVAL HISTORY: Brandi Rodgers is a 71 y.o. with above-mentioned history of right breast cancer. She presents to the clinic today for a follow-up.    ALLERGIES:  is allergic to adhesive [tape] and methocarbamol.  MEDICATIONS:  Current Outpatient Medications  Medication Sig Dispense Refill   acetaminophen (TYLENOL) 325 MG tablet as needed.     apixaban (ELIQUIS) 5 MG TABS tablet Take 1 tablet (5 mg total) by mouth 2 (two) times daily. 180 tablet 3   atenolol (TENORMIN) 50 MG tablet Take 50 mg by mouth daily.      bismuth subsalicylate (PEPTO BISMOL) 262 MG chewable tablet Chew 524 mg by mouth as needed.     Calcium-Vitamin D-Vitamin K (VIACTIV PO) Take 1  tablet by mouth daily.     gabapentin (NEURONTIN) 600 MG tablet Take 1 tablet (600 mg total) by mouth 2 (two) times daily.     letrozole (FEMARA) 2.5 MG tablet Take 1 tablet (2.5 mg total) by mouth daily. 90 tablet 3   Multiple Vitamin (MULTIVITAMIN) tablet Take 1 tablet by mouth daily.     rizatriptan (MAXALT-MLT) 10 MG disintegrating tablet Take 10 mg by mouth as needed for migraine. Reported on 09/14/2015     No current facility-administered medications for this visit.    PHYSICAL EXAMINATION: ECOG PERFORMANCE STATUS: {CHL ONC ECOG [phone]}  There were no vitals filed for this visit. There were no vitals filed for this visit.  BREAST:*** No palpable masses or nodules in either right or left breasts. No palpable axillary supraclavicular or infraclavicular adenopathy no breast tenderness or nipple discharge. (exam performed in the presence of a chaperone)  LABORATORY DATA:  I have reviewed the data as listed    Latest Ref Rng & Units 12/27/2015    3:24 PM 07/07/2015   12:26 PM 08/23/2014    4:26 AM  CMP  Glucose 65 - 99 mg/dL 98  91  136   BUN 7 - 25 mg/dL 9  10.9  11   Creatinine 0.50 - 0.99 mg/dL 0.83  0.8  0.57   Sodium 135 - 146 mmol/L 141  142  138   Potassium 3.5 - 5.3 mmol/L 4.1  4.0  4.6   Chloride 98 -  110 mmol/L 102   103   CO2 20 - 31 mmol/L 32  29  30   Calcium 8.6 - 10.4 mg/dL 9.6  10.0  8.8   Total Protein 6.4 - 8.3 g/dL  7.1    Total Bilirubin 0.20 - 1.20 mg/dL  0.52    Alkaline Phos 40 - 150 U/L  127    AST 5 - 34 U/L  22    ALT 0 - 55 U/L  18      Lab Results  Component Value Date   WBC 8.3 12/27/2015   HGB 13.2 12/27/2015   HCT 39.9 12/27/2015   MCV 86.7 12/27/2015   PLT 229 12/27/2015   NEUTROABS 6.6 (H) 07/07/2015    ASSESSMENT & PLAN:  No problem-specific Assessment & Plan notes found for this encounter.    No orders of the defined types were placed in this encounter.  The patient has a good understanding of the overall plan. she agrees  with it. she will call with any problems that may develop before the next visit here. Total time spent: 30 mins including face to face time and time spent for planning, charting and co-ordination of care   Suzzette Righter, [place] 12/05/21    I Gardiner Coins am scribing for Dr. Lindi Adie  ***

## 2021-12-07 ENCOUNTER — Other Ambulatory Visit: Payer: Self-pay

## 2021-12-07 ENCOUNTER — Inpatient Hospital Stay: Payer: Medicare PPO | Attending: Hematology and Oncology | Admitting: Hematology and Oncology

## 2021-12-07 DIAGNOSIS — D6869 Other thrombophilia: Secondary | ICD-10-CM | POA: Insufficient documentation

## 2021-12-07 DIAGNOSIS — C50511 Malignant neoplasm of lower-outer quadrant of right female breast: Secondary | ICD-10-CM

## 2021-12-07 DIAGNOSIS — M542 Cervicalgia: Secondary | ICD-10-CM | POA: Insufficient documentation

## 2021-12-07 DIAGNOSIS — I4891 Unspecified atrial fibrillation: Secondary | ICD-10-CM | POA: Diagnosis not present

## 2021-12-07 DIAGNOSIS — Z170 Estrogen receptor positive status [ER+]: Secondary | ICD-10-CM | POA: Diagnosis not present

## 2021-12-07 DIAGNOSIS — Z7901 Long term (current) use of anticoagulants: Secondary | ICD-10-CM | POA: Diagnosis not present

## 2021-12-07 DIAGNOSIS — Z79899 Other long term (current) drug therapy: Secondary | ICD-10-CM | POA: Diagnosis not present

## 2021-12-07 DIAGNOSIS — Z79811 Long term (current) use of aromatase inhibitors: Secondary | ICD-10-CM | POA: Diagnosis not present

## 2021-12-07 NOTE — Assessment & Plan Note (Addendum)
Rt Lumpectomy 07/16/15: IDC grade 2, 0.6 cm, with DCIS, 0/4 LN, ER 95%, PR 90% HER-2 negative ratio 1.70, KI 67:15%, Path stage: T1aN0 Stage 1A Oncotype DX score 19, 12% risk of recurrence Status post radiation 09/08/2015 to 10/26/2015  Current treatment: Anastrozole 1 mg daily 7 years started 11/01/2015 stopped March 2018, switched to letrozole 04/16/2018because of continuing hot flashes I sent a prescription for exemestane(did not make a difference). she wentback to letrozole.  Letrozole toxicities: 1.Hot flashes: Intermittently 2.myalgias arthralgias markedly better with letrozole.  Bone density done February 2019: T score -0.2 A.Fib on Eliquis Vaginal Dryness:  Breast cancer surveillance: 1.Breast exam7/26/2023: No abnormalities of concern 2.Mammograms at [facility]5/12/2021: Benign, breast density categoryA    Return to clinic in1 yearfor follow-up. 

## 2021-12-15 DIAGNOSIS — M5412 Radiculopathy, cervical region: Secondary | ICD-10-CM | POA: Diagnosis not present

## 2021-12-21 DIAGNOSIS — M4802 Spinal stenosis, cervical region: Secondary | ICD-10-CM | POA: Diagnosis not present

## 2021-12-21 DIAGNOSIS — M5030 Other cervical disc degeneration, unspecified cervical region: Secondary | ICD-10-CM | POA: Insufficient documentation

## 2021-12-21 DIAGNOSIS — M47816 Spondylosis without myelopathy or radiculopathy, lumbar region: Secondary | ICD-10-CM | POA: Diagnosis not present

## 2021-12-28 DIAGNOSIS — M79645 Pain in left finger(s): Secondary | ICD-10-CM | POA: Diagnosis not present

## 2021-12-28 DIAGNOSIS — M13842 Other specified arthritis, left hand: Secondary | ICD-10-CM | POA: Diagnosis not present

## 2021-12-30 ENCOUNTER — Other Ambulatory Visit: Payer: Self-pay | Admitting: Hematology and Oncology

## 2022-01-02 ENCOUNTER — Other Ambulatory Visit: Payer: Self-pay | Admitting: Cardiovascular Disease

## 2022-01-02 DIAGNOSIS — I480 Paroxysmal atrial fibrillation: Secondary | ICD-10-CM

## 2022-01-02 NOTE — Telephone Encounter (Signed)
Eliquis '5mg'$  refill request received. Patient is 71 years old, weight-113.9kg, Crea-0.69 on 12/08/2021 via [facility] from [place], [place], and last seen by Dr. Sallyanne Kuster on 12/01/2020 and pending an appt on 01/06/2022. Dose is appropriate based on dosing criteria. Will send in refill to requested pharmacy until appt and orders labs. ALSO, NOTED ON APPT TO SEND TO LAB FOR ORDERED LABS

## 2022-01-06 ENCOUNTER — Ambulatory Visit: Payer: Medicare PPO | Admitting: Cardiovascular Disease

## 2022-01-06 ENCOUNTER — Encounter: Payer: Self-pay | Admitting: Cardiovascular Disease

## 2022-01-06 VITALS — BP 128/78 | HR 90 | Ht 68.0 in | Wt 252.2 lb

## 2022-01-06 DIAGNOSIS — I4811 Longstanding persistent atrial fibrillation: Secondary | ICD-10-CM

## 2022-01-06 DIAGNOSIS — D6869 Other thrombophilia: Secondary | ICD-10-CM

## 2022-01-06 DIAGNOSIS — I2721 Secondary pulmonary arterial hypertension: Secondary | ICD-10-CM | POA: Diagnosis not present

## 2022-01-06 NOTE — Patient Instructions (Signed)
Medication Instructions:  No changes *If you need a refill on your cardiac medications before your next appointment, please call your pharmacy*   Lab Work: Labs to be done at PCP If you have labs (blood work) drawn today and your tests are completely normal, you will receive your results only by: [place] (if you have MyChart) OR A paper copy in the mail If you have any lab test that is abnormal or we need to change your treatment, we will call you to review the results.   Testing/Procedures: None ordered   Follow-Up: At [facility], you and your health needs are our priority.  As part of our continuing mission to provide you with exceptional heart care, we have created designated Provider Care Teams.  These Care Teams include your primary Cardiologist (physician) and Advanced Practice Providers (APPs -  Physician Assistants and Nurse Practitioners) who all work together to provide you with the care you need, when you need it.  We recommend signing up for the patient portal called "MyChart".  Sign up information is provided on this After Visit Summary.  MyChart is used to connect with patients for Virtual Visits (Telemedicine).  Patients are able to view lab/test results, encounter notes, upcoming appointments, etc.  Non-urgent messages can be sent to your provider as well.   To learn more about what you can do with MyChart, go to [email or web address].    Your next appointment:   12 month(s)  The format for your next appointment:   In Person  Provider:   Sanda Klein, MD {   Important Information About Sugar

## 2022-01-06 NOTE — Progress Notes (Signed)
Cardiology Office Note    Date:  01/06/2022   ID:  Brandi Rodgers, DOB [birth year], MRN [id number]  PCP:  Brandi Jordan, MD  Cardiologist:   Brandi Klein, MD   Chief complaint: Atrial fibrillation   History of Present Illness:  Brandi Rodgers is a 71 y.o. female with longstanding persistent atrial fibrillation , migraine headaches for which she takes atenolol , on anticoagulation with Eliquis without bleeding complications, morbid obesity, sleep apnea (uses a jaw advancement device).  She continues to feel quite well.  She is very rarely aware of the arrhythmia (she felt palpitations when she was in an MRI scanner).  She denies problems with orthopnea, PND, exertional dyspnea, lower extremity edema, chest pain at rest or with activity, dizziness or syncope.  She has not had any falls or any serious bleeding problems.  Ventricular rate is well controlled at 90 bpm.  She has not had any falls or bleeding problems.  She does not have daytime hypersomnolence.   Echocardiogram performed in October 2015 showed normal left ventricular wall thickness and ejection fraction, normal left atrial size, but did show mild pulmonary hypertension with an estimated systolic PA pressure 41 mmHg. Has a history of right breast cancer status post surgery and right breast radiation therapy. She does not have angina pectoris or any known coronary or peripheral vascular obstructive lesions. She has mild hyperlipidemia which has been managed with diet alone. She has had problems with edema in the past, but uses furosemide extremely rarely.   Past Medical History:  Diagnosis Date   Arthritis    cervical and lumbar spine   Breast cancer of lower-outer quadrant of right female breast ([place]) 07/02/2015   Degenerative lumbar disc    Dental crowns present    GERD (gastroesophageal reflux disease)    Hypercholesterolemia    borderline - no current med.   Jaw clicking    Migraines    Osteoarthritis     bilateral knee   Paroxysmal atrial fibrillation ([place])    last episode was 01/2014   Radiation 09/07/15-10/26/15   right breast 50.4 Gy, boost to 10 Gy   Sleep apnea    uses mouth guard at night; no CPAP    Past Surgical History:  Procedure Laterality Date   CARPAL TUNNEL RELEASE Right 05/13/2003   DILATION AND CURETTAGE OF UTERUS     KNEE ARTHROSCOPY Right 04/06/2003   KNEE ARTHROSCOPY Left 11/30/2003   RADIOACTIVE SEED GUIDED PARTIAL MASTECTOMY WITH AXILLARY SENTINEL LYMPH NODE BIOPSY Right 07/16/2015   Procedure: RADIOACTIVE SEED GUIDED PARTIAL MASTECTOMY WITH AXILLARY SENTINEL LYMPH NODE BIOPSY;  Surgeon: [name], MD;  Location: [facility];  Service: General;  Laterality: Right;   TOTAL KNEE ARTHROPLASTY Left 03/23/2014   Procedure: LEFT TOTAL KNEE ARTHROPLASTY ;  Surgeon: [name], MD;  Location: [facility];  Service: Orthopedics;  Laterality: Left;   TOTAL KNEE ARTHROPLASTY Right 08/21/2014   Procedure: RIGHT TOTAL KNEE ARTHROPLASTY;  Surgeon: Gaynelle Arabian, MD;  Location: [facility];  Service: Orthopedics;  Laterality: Right;    Current Medications: Outpatient Medications Prior to Visit  Medication Sig Dispense Refill   acetaminophen (TYLENOL) 325 MG tablet as needed.     atenolol (TENORMIN) 50 MG tablet Take 50 mg by mouth daily.      Calcium-Vitamin D-Vitamin K (VIACTIV PO) Take 1 tablet by mouth daily.     ELIQUIS 5 MG TABS tablet TAKE 1 TABLET BY MOUTH TWICE A DAY 180  tablet 0   gabapentin (NEURONTIN) 600 MG tablet Take 1 tablet (600 mg total) by mouth 2 (two) times daily.     letrozole (FEMARA) 2.5 MG tablet TAKE 1 TABLET BY MOUTH EVERY DAY 90 tablet 3   Multiple Vitamin (MULTI-VITAMIN DAILY PO) Multi Vitamin     bismuth subsalicylate (PEPTO BISMOL) 262 MG chewable tablet Chew 524 mg by mouth as needed. (Patient not taking: Reported on 01/06/2022)     Cranberry (AZO CRANBERRY GUMMIES) 250 MG CHEW 2 gummies (Patient not taking: Reported on 01/06/2022)      Multiple Vitamin (MULTIVITAMIN) tablet Take 1 tablet by mouth daily.     rizatriptan (MAXALT-MLT) 10 MG disintegrating tablet Take 10 mg by mouth as needed for migraine. Reported on 09/14/2015 (Patient not taking: Reported on 01/06/2022)     valACYclovir (VALTREX) 1000 MG tablet Take 1,000 mg by mouth 3 (three) times daily. (Patient not taking: Reported on 01/06/2022)     No facility-administered medications prior to visit.     Allergies:   Methocarbamol, Tape, and Amoxicillin-pot clavulanate   Social History   Socioeconomic History   Marital status: Married    Spouse name: Not on file   Number of children: Not on file   Years of education: Not on file   Highest education level: Not on file  Occupational History   Not on file  Tobacco Use   Smoking status: Never   Smokeless tobacco: Never  Vaping Use   Vaping Use: Never used  Substance and Sexual Activity   Alcohol use: Yes    Comment: occasionally   Drug use: No   Sexual activity: Yes  Other Topics Concern   Not on file  Social History Narrative   Not on file   Social Determinants of Health   Financial Resource Strain: Not on file  Food Insecurity: Not on file  Transportation Needs: Not on file  Physical Activity: Not on file  Stress: Not on file  Social Connections: Not on file     Family History:  The patient's family history includes Lung cancer in her mother; Thyroid cancer in her mother.   ROS:   Please see the history of present illness.    ROS all other systems are reviewed and are negative   PHYSICAL EXAM:   VS:  BP 128/78   Pulse 90   Ht '5\' 8"'$  (1.727 m)   Wt 252 lb 3.2 oz (114.4 kg)   SpO2 94%   BMI 38.35 kg/m      General: Alert, oriented x3, no distress, severely obese Head: no evidence of trauma, PERRL, EOMI, no exophtalmos or lid lag, no myxedema, no xanthelasma; normal ears, nose and oropharynx Neck: normal jugular venous pulsations and no hepatojugular reflux; brisk carotid pulses without  delay and no carotid bruits Chest: clear to auscultation, no signs of consolidation by percussion or palpation, normal fremitus, symmetrical and full respiratory excursions Cardiovascular: normal position and quality of the apical impulse, regular rhythm, normal first and second heart sounds, no murmurs, rubs or gallops Abdomen: no tenderness or distention, no masses by palpation, no abnormal pulsatility or arterial bruits, normal bowel sounds, no hepatosplenomegaly Extremities: no clubbing, cyanosis or edema; 2+ radial, ulnar and brachial pulses bilaterally; 2+ right femoral, posterior tibial and dorsalis pedis pulses; 2+ left femoral, posterior tibial and dorsalis pedis pulses; no subclavian or femoral bruits Neurological: grossly nonfocal Psych: Normal mood and affect    Wt Readings from Last 3 Encounters:  01/06/22 252 lb 3.2  oz (114.4 kg)  12/07/21 251 lb 3.2 oz (113.9 kg)  12/01/20 247 lb (112 kg)      Studies/Labs Reviewed:   EKG:  EKG is ordered today.  It shows atrial fibrillation with controlled ventricular rate and is otherwise normal.  QTc 398 ms. Recent Labs: No results found for requested labs within last 365 days.   01/25/2018 Hemoglobin 14.3, creatinine 0.7, potassium 4.2, normal liver function test, TSH 0.5 10/22/2019 Hemoglobin 14.6, creatinine 0.75, potassium 4.1, ALT 16, TSH 1.99  Lipid Panel  No results found for: "CHOL", "TRIG", "HDL", "CHOLHDL", "VLDL", "LDLCALC", "LDLDIRECT"   01/25/2018 Total cholesterol 181, HDL 52, LDL 106, triglycerides 116 10/22/2019 Cholesterol 159, HDL 46, LDL 93, triglycerides 116  ASSESSMENT:    1. Longstanding persistent atrial fibrillation ([place])   2. Acquired thrombophilia ([place])   3. Severe obesity (BMI 35.0-39.9) with comorbidity ([place])   4. PAH (pulmonary artery hypertension) ([facility])       PLAN:  In order of problems listed above:  AFib: Longstanding persistent arrhythmia, essentially asymptomatic.  Has been going on  for at least 4 years.  Good rate control fortuitously from beta-blocker therapy for migraines.  Continue anticoagulation.  No plan for cardioversion or antiarrhythmics. Eliquis: CHA2DS2-VASc score of 2 (age and gender).  No bleeding complications.  She is scheduled to have her labs tested (including renal function and CBC) on Monday with Dr. Stephanie Acre. Moderate obesity: Encouraged efforts to lose weight.  Her progress has stalled in the last year. PAH: This was mild on a previous echo.  She denies shortness of breath.  She denies daytime hypersomnolence and believes that her sleep apnea is well treated.  Does not have lower extremity edema or other signs of heart failure.    Medication Adjustments/Labs and Tests Ordered: Current medicines are reviewed at length with the patient today.  Concerns regarding medicines are outlined above.  Medication changes, Labs and Tests ordered today are listed in the Patient Instructions below. Patient Instructions  Medication Instructions:  No changes *If you need a refill on your cardiac medications before your next appointment, please call your pharmacy*   Lab Work: Labs to be done at PCP If you have labs (blood work) drawn today and your tests are completely normal, you will receive your results only by: [place] (if you have MyChart) OR A paper copy in the mail If you have any lab test that is abnormal or we need to change your treatment, we will call you to review the results.   Testing/Procedures: None ordered   Follow-Up: At [facility], you and your health needs are our priority.  As part of our continuing mission to provide you with exceptional heart care, we have created designated Provider Care Teams.  These Care Teams include your primary Cardiologist (physician) and Advanced Practice Providers (APPs -  Physician Assistants and Nurse Practitioners) who all work together to provide you with the care you need, when you need it.  We  recommend signing up for the patient portal called "MyChart".  Sign up information is provided on this After Visit Summary.  MyChart is used to connect with patients for Virtual Visits (Telemedicine).  Patients are able to view lab/test results, encounter notes, upcoming appointments, etc.  Non-urgent messages can be sent to your provider as well.   To learn more about what you can do with MyChart, go to [email or web address].    Your next appointment:   12 month(s)  The format for your next appointment:  In Person  Provider:   Sanda Klein, MD {   Important Information About Sugar         Signed, Brandi Klein, MD  01/06/2022 1:00 PM    [address], [place], [place]  [address] Phone: [phone]; Fax: [phone]

## 2022-01-09 DIAGNOSIS — E559 Vitamin D deficiency, unspecified: Secondary | ICD-10-CM | POA: Diagnosis not present

## 2022-01-09 DIAGNOSIS — G43909 Migraine, unspecified, not intractable, without status migrainosus: Secondary | ICD-10-CM | POA: Diagnosis not present

## 2022-01-09 DIAGNOSIS — Z23 Encounter for immunization: Secondary | ICD-10-CM | POA: Diagnosis not present

## 2022-01-09 DIAGNOSIS — E785 Hyperlipidemia, unspecified: Secondary | ICD-10-CM | POA: Diagnosis not present

## 2022-01-09 DIAGNOSIS — Z0000 Encounter for general adult medical examination without abnormal findings: Secondary | ICD-10-CM | POA: Diagnosis not present

## 2022-01-09 DIAGNOSIS — I480 Paroxysmal atrial fibrillation: Secondary | ICD-10-CM | POA: Diagnosis not present

## 2022-01-09 DIAGNOSIS — C50911 Malignant neoplasm of unspecified site of right female breast: Secondary | ICD-10-CM | POA: Diagnosis not present

## 2022-01-09 DIAGNOSIS — I10 Essential (primary) hypertension: Secondary | ICD-10-CM | POA: Diagnosis not present

## 2022-01-09 DIAGNOSIS — Z79899 Other long term (current) drug therapy: Secondary | ICD-10-CM | POA: Diagnosis not present

## 2022-01-10 NOTE — Addendum Note (Signed)
Addended by: Caprice Beaver T on: 01/10/2022 02:03 PM   Modules accepted: Orders

## 2022-02-13 ENCOUNTER — Other Ambulatory Visit: Payer: Self-pay | Admitting: Cardiovascular Disease

## 2022-02-13 NOTE — Telephone Encounter (Signed)
Prescription refill request for Eliquis received. Indication: AF Last office visit: 01/06/22  Jerilynn Mages Croitoru MD Scr: 0.69 on 01/09/22 Age:  70 Weight: 114.4kg  Based on above findings Eliquis '5mg'$  twice daily is the appropriate dose.  Refill approved.

## 2022-02-20 ENCOUNTER — Telehealth: Payer: Self-pay | Admitting: Cardiovascular Disease

## 2022-02-20 NOTE — Telephone Encounter (Signed)
Patient c/o Palpitations:  High priority if patient c/o lightheadedness, shortness of breath, or chest pain  How long have you had palpitations/irregular HR/ Afib? Are you having the symptoms now? Had afib earlier this morning, no  Are you currently experiencing lightheadedness, SOB or CP? Chest tightness now, had chest tightness, SOB, Nausea and vomiting Saturday  Do you have a history of afib (atrial fibrillation) or irregular heart rhythm? yes  Have you checked your BP or HR? (document readings if available): This morning 150/100, last Saturday 200/117, usually ranges around 150/98  Are you experiencing any other symptoms? Headache  Patient states she had afib earlier and has been having chest tightness and pain in her armpit. She says she has been getting the chest tightness for about a week and says it comes and goes. She says Saturday her symptoms were bad and she took an extra BP pill and it helped. She says is currently have some chest tightness.

## 2022-02-20 NOTE — Telephone Encounter (Signed)
Received a call from patient she stated she has had 2 episodes of chest tightness,pain right arm pit,pounding heart.No pain and no pounding heart beat at present.Appointment scheduled with Dr.Croitoru 10/10 at 3:30 pm.Advised if she has any more chest tightness go to ED.

## 2022-02-21 ENCOUNTER — Ambulatory Visit: Payer: Medicare PPO | Attending: Cardiovascular Disease | Admitting: Cardiovascular Disease

## 2022-02-21 ENCOUNTER — Encounter: Payer: Self-pay | Admitting: Cardiovascular Disease

## 2022-02-21 VITALS — BP 118/78 | HR 116 | Ht 68.0 in | Wt 254.6 lb

## 2022-02-21 DIAGNOSIS — D6869 Other thrombophilia: Secondary | ICD-10-CM

## 2022-02-21 DIAGNOSIS — I4811 Longstanding persistent atrial fibrillation: Secondary | ICD-10-CM | POA: Diagnosis not present

## 2022-02-21 DIAGNOSIS — R079 Chest pain, unspecified: Secondary | ICD-10-CM | POA: Diagnosis not present

## 2022-02-21 NOTE — H&P (View-Only) (Signed)
Cardiology Office Note    Date:  02/21/2022   ID:  Brandi Rodgers, DOB 05/14/1951, MRN [id number]  PCP:  Brandi Jordan, MD  Cardiologist:   Sanda Klein, MD   Chief complaint: Chest pain   History of Present Illness:  Brandi Rodgers is a 71 y.o. female with longstanding persistent atrial fibrillation , migraine headaches for which she takes atenolol , on anticoagulation with Eliquis without bleeding complications, morbid obesity, sleep apnea (uses a jaw advancement device).  She was doing great when we last met for her usual appointment on August 25.  She is very rarely aware of palpitations and has not had problems with exertional dyspnea, edema, orthopnea, PND, dizziness, syncope or any previous problems with chest discomfort.  Ventricular rates have always been well controlled.  She does not have daytime hypersomnolence.  Just over the last several days she has had recurrent episodes of chest discomfort.  On September 30 not long after breakfast (honey bun) she had discomfort in her right axilla radiating as a pressure-like sensation across her upper chest associated with rapid palpitations and nausea.  She had 1 episode of vomiting.  She checked her blood pressure which was high at 190/112.  She took an extra atenolol and felt better within a matter of less than 30 minutes.  On Tuesday a week ago she had rapid palpitations again and saw that her diastolic blood pressure was still high at about 95 mmHg which is unusual for her.  She again took an extra atenolol and felt better.  She felt back to normal for the next few days but last Saturday, 3 days before this visit she had another episode of left axillary discomfort with radiation across the middle of her chest about an hour or 2 after having October fast dinner that included a glass of wine, glass of sweet tea and a full meal.  Symptoms again improved after she took an extra atenolol.  Today she presents in clinic and is in  her usual atrial fibrillation but has unexpectedly rapid ventricular rate at 116 bpm.  She feels quite well and denies any problems with chest discomfort, axillary discomfort, shortness of breath, palpitations, nausea or vomiting.  Her ECG shows atrial fibrillation rapid ventricular response and new inverted T waves in leads V4-V6 as well as 1 and aVL   Echocardiogram performed in October 2015 showed normal left ventricular wall thickness and ejection fraction, normal left atrial size, but did show mild pulmonary hypertension with an estimated systolic PA pressure 41 mmHg. Has a history of right breast cancer status post surgery and right breast radiation therapy. She does not have angina pectoris or any known coronary or peripheral vascular obstructive lesions. She has mild hyperlipidemia which has been managed with diet alone. She has had problems with edema in the past, but uses furosemide extremely rarely.   Past Medical History:  Diagnosis Date   Arthritis    cervical and lumbar spine   Breast cancer of lower-outer quadrant of right female breast ([place]) 07/02/2015   Degenerative lumbar disc    Dental crowns present    GERD (gastroesophageal reflux disease)    Hypercholesterolemia    borderline - no current med.   Jaw clicking    Migraines    Osteoarthritis    bilateral knee   Paroxysmal atrial fibrillation ([place])    last episode was 01/2014   Radiation 09/07/15-10/26/15   right breast 50.4 Gy, boost to 10 Gy  Sleep apnea    uses mouth guard at night; no CPAP    Past Surgical History:  Procedure Laterality Date   CARPAL TUNNEL RELEASE Right 05/13/2003   DILATION AND CURETTAGE OF UTERUS     KNEE ARTHROSCOPY Right 04/06/2003   KNEE ARTHROSCOPY Left 11/30/2003   RADIOACTIVE SEED GUIDED PARTIAL MASTECTOMY WITH AXILLARY SENTINEL LYMPH NODE BIOPSY Right 07/16/2015   Procedure: RADIOACTIVE SEED GUIDED PARTIAL MASTECTOMY WITH AXILLARY SENTINEL LYMPH NODE BIOPSY;  Surgeon: Brandi Skates, MD;   Location: [facility];  Service: General;  Laterality: Right;   TOTAL KNEE ARTHROPLASTY Left 03/23/2014   Procedure: LEFT TOTAL KNEE ARTHROPLASTY ;  Surgeon: Brandi Alf, MD;  Location: [facility];  Service: Orthopedics;  Laterality: Left;   TOTAL KNEE ARTHROPLASTY Right 08/21/2014   Procedure: RIGHT TOTAL KNEE ARTHROPLASTY;  Surgeon: Brandi Arabian, MD;  Location: [facility];  Service: Orthopedics;  Laterality: Right;    Current Medications: Outpatient Medications Prior to Visit  Medication Sig Dispense Refill   acetaminophen (TYLENOL) 325 MG tablet as needed.     apixaban (ELIQUIS) 5 MG TABS tablet TAKE 1 TABLET BY MOUTH TWICE A DAY 180 tablet 1   atenolol (TENORMIN) 50 MG tablet Take 50 mg by mouth daily.      bismuth subsalicylate (PEPTO BISMOL) 262 MG chewable tablet Chew 524 mg by mouth as needed.     Calcium-Vitamin D-Vitamin K (VIACTIV PO) Take 1 tablet by mouth daily.     gabapentin (NEURONTIN) 600 MG tablet Take 1 tablet (600 mg total) by mouth 2 (two) times daily.     letrozole (FEMARA) 2.5 MG tablet TAKE 1 TABLET BY MOUTH EVERY DAY 90 tablet 3   Multiple Vitamin (MULTI-VITAMIN DAILY PO) Multi Vitamin     rizatriptan (MAXALT-MLT) 10 MG disintegrating tablet Take 10 mg by mouth as needed for migraine. Reported on 09/14/2015     valACYclovir (VALTREX) 1000 MG tablet Take 1,000 mg by mouth 3 (three) times daily.     No facility-administered medications prior to visit.     Allergies:   Methocarbamol, Tape, and Amoxicillin-pot clavulanate   Social History   Socioeconomic History   Marital status: Married    Spouse name: Not on file   Number of children: Not on file   Years of education: Not on file   Highest education level: Not on file  Occupational History   Not on file  Tobacco Use   Smoking status: Never   Smokeless tobacco: Never  Vaping Use   Vaping Use: Never used  Substance and Sexual Activity   Alcohol use: Yes    Comment: occasionally   Drug use: No    Sexual activity: Yes  Other Topics Concern   Not on file  Social History Narrative   Not on file   Social Determinants of Health   Financial Resource Strain: Not on file  Food Insecurity: Not on file  Transportation Needs: Not on file  Physical Activity: Not on file  Stress: Not on file  Social Connections: Not on file     Family History:  The patient's family history includes Lung cancer in her mother; Thyroid cancer in her mother.   ROS:   Please see the history of present illness.    ROS all other systems are reviewed and are negative   PHYSICAL EXAM:   VS:  BP 118/78 (BP Location: Left Arm, Patient Position: Sitting, Cuff Size: Large)   Pulse (!) 116   Ht 5' 8" (1.727  m)   Wt 254 lb 9.6 oz (115.5 kg)   SpO2 95%   BMI 38.71 kg/m       General: Alert, oriented x3, no distress, severely obese Head: no evidence of trauma, PERRL, EOMI, no exophtalmos or lid lag, no myxedema, no xanthelasma; normal ears, nose and oropharynx Neck: normal jugular venous pulsations and no hepatojugular reflux; brisk carotid pulses without delay and no carotid bruits Chest: clear to auscultation, no signs of consolidation by percussion or palpation, normal fremitus, symmetrical and full respiratory excursions Cardiovascular: normal position and quality of the apical impulse, irregular rhythm, normal first and second heart sounds, no murmurs, rubs or gallops Abdomen: no tenderness or distention, no masses by palpation, no abnormal pulsatility or arterial bruits, normal bowel sounds, no hepatosplenomegaly Extremities: no clubbing, cyanosis or edema; 2+ radial, ulnar and brachial pulses bilaterally; 2+ right femoral, posterior tibial and dorsalis pedis pulses; 2+ left femoral, posterior tibial and dorsalis pedis pulses; no subclavian or femoral bruits Neurological: grossly nonfocal Psych: Normal mood and affect     Wt Readings from Last 3 Encounters:  02/21/22 254 lb 9.6 oz (115.5 kg)   01/06/22 252 lb 3.2 oz (114.4 kg)  12/07/21 251 lb 3.2 oz (113.9 kg)      Studies/Labs Reviewed:   EKG:  EKG is ordered today.  It shows atrial fibrillation rapid ventricular response, new T wave inversions are seen in the lateral leads I, aVL, V4-V6 Recent Labs: No results found for requested labs within last 365 days.   01/25/2018 Hemoglobin 14.3, creatinine 0.7, potassium 4.2, normal liver function test, TSH 0.5 10/22/2019 Hemoglobin 14.6, creatinine 0.75, potassium 4.1, ALT 16, TSH 1.99  Lipid Panel  No results found for: "CHOL", "TRIG", "HDL", "CHOLHDL", "VLDL", "LDLCALC", "LDLDIRECT"   01/25/2018 Total cholesterol 181, HDL 52, LDL 106, triglycerides 116 10/22/2019 Cholesterol 159, HDL 46, LDL 93, triglycerides 116  ASSESSMENT:    1. Chest pain of uncertain etiology   2. Longstanding persistent atrial fibrillation ([place])   3. Acquired thrombophilia ([place])       PLAN:  In order of problems listed above:  Unstable angina: Episodes of recurrent right axillary and chest discomfort at rest are concerning for possible unstable angina, although the symptoms are not entirely typical (right axillary pain and the fact that the symptoms occur after a meal raise the possibility of this is actually biliary colic).  Unclear whether the ECG repolarization changes are due to ischemia or rate related.  Right now she is completely asymptomatic.  We will schedule for a Lexiscan Myoview (will not be able to get coronary CTA due to atrial fibrillation).  We will increase the dose of atenolol to 100 mg daily.  Not on aspirin due to full anticoagulation with Eliquis.  Told her several times that if she has recurrent symptoms that last for more than 30 minutes she should go and seek emergency attention in an emergency department, since this may represent an acute coronary syndrome.  If cardiac work-up is negative we will follow-up with the right upper quadrant ultrasound. Afib: Longstanding  persistent arrhythmia, essentially asymptomatic until now.  Has been going on for at least 4 years.  Good rate control fortuitously from beta-blocker therapy, which she has been receiving for migraines.  Continue anticoagulation.  If coronary angiography is necessary and not emergent , we will have to stop the Eliquis for 24 hours.  No plan for cardioversion or antiarrhythmics. Eliquis: CHA2DS2-VASc score of 2 (age and gender).  Has not had  any bleeding issues. Moderate obesity: Unsuccessful at any weight loss in the last few months. PAH: This was mild on a previous echo.  She denies shortness of breath.  She denies daytime hypersomnolence and believes that her sleep apnea is well treated.  Does not have lower extremity edema or other signs of right heart failure.    Medication Adjustments/Labs and Tests Ordered: Current medicines are reviewed at length with the patient today.  Concerns regarding medicines are outlined above.  Medication changes, Labs and Tests ordered today are listed in the Patient Instructions below. Patient Instructions  Medication Instructions:  No changes *If you need a refill on your cardiac medications before your next appointment, please call your pharmacy*   Lab Work: None ordered If you have labs (blood work) drawn today and your tests are completely normal, you will receive your results only by: [place] (if you have MyChart) OR A paper copy in the mail If you have any lab test that is abnormal or we need to change your treatment, we will call you to review the results.   Testing/Procedures: Your physician has requested that you have a lexiscan myoview. For further information please visit [email or web address]. Please follow instruction sheet, as given. This will take place at [address], [address]  How to prepare for your Myocardial Perfusion Test: Do not eat or drink 3 hours prior to your test, except you may have water. Do not consume products  containing caffeine (regular or decaffeinated) 12 hours prior to your test. (ex: coffee, chocolate, sodas, tea). Do bring a list of your current medications with you.  If not listed below, you may take your medications as normal. Do wear comfortable clothes (no dresses or overalls) and walking shoes, tennis shoes preferred (No heels or open toe shoes are allowed). Do NOT wear cologne, perfume, aftershave, or lotions (deodorant is allowed). The test will take approximately 3 to 4 hours to complete If these instructions are not followed, your test will have to be rescheduled.    Follow-Up: At [facility], you and your health needs are our priority.  As part of our continuing mission to provide you with exceptional heart care, we have created designated Provider Care Teams.  These Care Teams include your primary Cardiologist (physician) and Advanced Practice Providers (APPs -  Physician Assistants and Nurse Practitioners) who all work together to provide you with the care you need, when you need it.  We recommend signing up for the patient portal called "MyChart".  Sign up information is provided on this After Visit Summary.  MyChart is used to connect with patients for Virtual Visits (Telemedicine).  Patients are able to view lab/test results, encounter notes, upcoming appointments, etc.  Non-urgent messages can be sent to your provider as well.   To learn more about what you can do with MyChart, go to [email or web address].    Your next appointment:   Follow up pending results     Signed, Sanda Klein, MD  02/21/2022 4:18 PM    [facility] [address], [place], [place]  [address] Phone: [phone]; Fax: [phone]

## 2022-02-21 NOTE — Progress Notes (Signed)
Cardiology Office Note    Date:  02/21/2022   ID:  Brandi Rodgers, DOB 05/14/1951, MRN [id number]  PCP:  Jonathon Jordan, MD  Cardiologist:   Sanda Klein, MD   Chief complaint: Chest pain   History of Present Illness:  Brandi Rodgers is a 71 y.o. female with longstanding persistent atrial fibrillation , migraine headaches for which she takes atenolol , on anticoagulation with Eliquis without bleeding complications, morbid obesity, sleep apnea (uses a jaw advancement device).  She was doing great when we last met for her usual appointment on August 25.  She is very rarely aware of palpitations and has not had problems with exertional dyspnea, edema, orthopnea, PND, dizziness, syncope or any previous problems with chest discomfort.  Ventricular rates have always been well controlled.  She does not have daytime hypersomnolence.  Just over the last several days she has had recurrent episodes of chest discomfort.  On September 30 not long after breakfast (honey bun) she had discomfort in her right axilla radiating as a pressure-like sensation across her upper chest associated with rapid palpitations and nausea.  She had 1 episode of vomiting.  She checked her blood pressure which was high at 190/112.  She took an extra atenolol and felt better within a matter of less than 30 minutes.  On Tuesday a week ago she had rapid palpitations again and saw that her diastolic blood pressure was still high at about 95 mmHg which is unusual for her.  She again took an extra atenolol and felt better.  She felt back to normal for the next few days but last Saturday, 3 days before this visit she had another episode of left axillary discomfort with radiation across the middle of her chest about an hour or 2 after having October fast dinner that included a glass of wine, glass of sweet tea and a full meal.  Symptoms again improved after she took an extra atenolol.  Today she presents in clinic and is in  her usual atrial fibrillation but has unexpectedly rapid ventricular rate at 116 bpm.  She feels quite well and denies any problems with chest discomfort, axillary discomfort, shortness of breath, palpitations, nausea or vomiting.  Her ECG shows atrial fibrillation rapid ventricular response and new inverted T waves in leads V4-V6 as well as 1 and aVL   Echocardiogram performed in October 2015 showed normal left ventricular wall thickness and ejection fraction, normal left atrial size, but did show mild pulmonary hypertension with an estimated systolic PA pressure 41 mmHg. Has a history of right breast cancer status post surgery and right breast radiation therapy. She does not have angina pectoris or any known coronary or peripheral vascular obstructive lesions. She has mild hyperlipidemia which has been managed with diet alone. She has had problems with edema in the past, but uses furosemide extremely rarely.   Past Medical History:  Diagnosis Date   Arthritis    cervical and lumbar spine   Breast cancer of lower-outer quadrant of right female breast ([place]) 07/02/2015   Degenerative lumbar disc    Dental crowns present    GERD (gastroesophageal reflux disease)    Hypercholesterolemia    borderline - no current med.   Jaw clicking    Migraines    Osteoarthritis    bilateral knee   Paroxysmal atrial fibrillation ([place])    last episode was 01/2014   Radiation 09/07/15-10/26/15   right breast 50.4 Gy, boost to 10 Gy  Sleep apnea    uses mouth guard at night; no CPAP    Past Surgical History:  Procedure Laterality Date   CARPAL TUNNEL RELEASE Right 05/13/2003   DILATION AND CURETTAGE OF UTERUS     KNEE ARTHROSCOPY Right 04/06/2003   KNEE ARTHROSCOPY Left 11/30/2003   RADIOACTIVE SEED GUIDED PARTIAL MASTECTOMY WITH AXILLARY SENTINEL LYMPH NODE BIOPSY Right 07/16/2015   Procedure: RADIOACTIVE SEED GUIDED PARTIAL MASTECTOMY WITH AXILLARY SENTINEL LYMPH NODE BIOPSY;  Surgeon: Haywood Ingram, MD;   Location: [facility];  Service: General;  Laterality: Right;   TOTAL KNEE ARTHROPLASTY Left 03/23/2014   Procedure: LEFT TOTAL KNEE ARTHROPLASTY ;  Surgeon: Frank Aluisio V, MD;  Location: [facility];  Service: Orthopedics;  Laterality: Left;   TOTAL KNEE ARTHROPLASTY Right 08/21/2014   Procedure: RIGHT TOTAL KNEE ARTHROPLASTY;  Surgeon: Frank Aluisio, MD;  Location: [facility];  Service: Orthopedics;  Laterality: Right;    Current Medications: Outpatient Medications Prior to Visit  Medication Sig Dispense Refill   acetaminophen (TYLENOL) 325 MG tablet as needed.     apixaban (ELIQUIS) 5 MG TABS tablet TAKE 1 TABLET BY MOUTH TWICE A DAY 180 tablet 1   atenolol (TENORMIN) 50 MG tablet Take 50 mg by mouth daily.      bismuth subsalicylate (PEPTO BISMOL) 262 MG chewable tablet Chew 524 mg by mouth as needed.     Calcium-Vitamin D-Vitamin K (VIACTIV PO) Take 1 tablet by mouth daily.     gabapentin (NEURONTIN) 600 MG tablet Take 1 tablet (600 mg total) by mouth 2 (two) times daily.     letrozole (FEMARA) 2.5 MG tablet TAKE 1 TABLET BY MOUTH EVERY DAY 90 tablet 3   Multiple Vitamin (MULTI-VITAMIN DAILY PO) Multi Vitamin     rizatriptan (MAXALT-MLT) 10 MG disintegrating tablet Take 10 mg by mouth as needed for migraine. Reported on 09/14/2015     valACYclovir (VALTREX) 1000 MG tablet Take 1,000 mg by mouth 3 (three) times daily.     No facility-administered medications prior to visit.     Allergies:   Methocarbamol, Tape, and Amoxicillin-pot clavulanate   Social History   Socioeconomic History   Marital status: Married    Spouse name: Not on file   Number of children: Not on file   Years of education: Not on file   Highest education level: Not on file  Occupational History   Not on file  Tobacco Use   Smoking status: Never   Smokeless tobacco: Never  Vaping Use   Vaping Use: Never used  Substance and Sexual Activity   Alcohol use: Yes    Comment: occasionally   Drug use: No    Sexual activity: Yes  Other Topics Concern   Not on file  Social History Narrative   Not on file   Social Determinants of Health   Financial Resource Strain: Not on file  Food Insecurity: Not on file  Transportation Needs: Not on file  Physical Activity: Not on file  Stress: Not on file  Social Connections: Not on file     Family History:  The patient's family history includes Lung cancer in her mother; Thyroid cancer in her mother.   ROS:   Please see the history of present illness.    ROS all other systems are reviewed and are negative   PHYSICAL EXAM:   VS:  BP 118/78 (BP Location: Left Arm, Patient Position: Sitting, Cuff Size: Large)   Pulse (!) 116   Ht 5' 8" (1.727   m)   Wt 254 lb 9.6 oz (115.5 kg)   SpO2 95%   BMI 38.71 kg/m       General: Alert, oriented x3, no distress, severely obese Head: no evidence of trauma, PERRL, EOMI, no exophtalmos or lid lag, no myxedema, no xanthelasma; normal ears, nose and oropharynx Neck: normal jugular venous pulsations and no hepatojugular reflux; brisk carotid pulses without delay and no carotid bruits Chest: clear to auscultation, no signs of consolidation by percussion or palpation, normal fremitus, symmetrical and full respiratory excursions Cardiovascular: normal position and quality of the apical impulse, irregular rhythm, normal first and second heart sounds, no murmurs, rubs or gallops Abdomen: no tenderness or distention, no masses by palpation, no abnormal pulsatility or arterial bruits, normal bowel sounds, no hepatosplenomegaly Extremities: no clubbing, cyanosis or edema; 2+ radial, ulnar and brachial pulses bilaterally; 2+ right femoral, posterior tibial and dorsalis pedis pulses; 2+ left femoral, posterior tibial and dorsalis pedis pulses; no subclavian or femoral bruits Neurological: grossly nonfocal Psych: Normal mood and affect     Wt Readings from Last 3 Encounters:  02/21/22 254 lb 9.6 oz (115.5 kg)   01/06/22 252 lb 3.2 oz (114.4 kg)  12/07/21 251 lb 3.2 oz (113.9 kg)      Studies/Labs Reviewed:   EKG:  EKG is ordered today.  It shows atrial fibrillation rapid ventricular response, new T wave inversions are seen in the lateral leads I, aVL, V4-V6 Recent Labs: No results found for requested labs within last 365 days.   01/25/2018 Hemoglobin 14.3, creatinine 0.7, potassium 4.2, normal liver function test, TSH 0.5 10/22/2019 Hemoglobin 14.6, creatinine 0.75, potassium 4.1, ALT 16, TSH 1.99  Lipid Panel  No results found for: "CHOL", "TRIG", "HDL", "CHOLHDL", "VLDL", "LDLCALC", "LDLDIRECT"   01/25/2018 Total cholesterol 181, HDL 52, LDL 106, triglycerides 116 10/22/2019 Cholesterol 159, HDL 46, LDL 93, triglycerides 116  ASSESSMENT:    1. Chest pain of uncertain etiology   2. Longstanding persistent atrial fibrillation (HCC)   3. Acquired thrombophilia (HCC)       PLAN:  In order of problems listed above:  Unstable angina: Episodes of recurrent right axillary and chest discomfort at rest are concerning for possible unstable angina, although the symptoms are not entirely typical (right axillary pain and the fact that the symptoms occur after a meal raise the possibility of this is actually biliary colic).  Unclear whether the ECG repolarization changes are due to ischemia or rate related.  Right now she is completely asymptomatic.  We will schedule for a Lexiscan Myoview (will not be able to get coronary CTA due to atrial fibrillation).  We will increase the dose of atenolol to 100 mg daily.  Not on aspirin due to full anticoagulation with Eliquis.  Told her several times that if she has recurrent symptoms that last for more than 30 minutes she should go and seek emergency attention in an emergency department, since this may represent an acute coronary syndrome.  If cardiac work-up is negative we will follow-up with the right upper quadrant ultrasound. Afib: Longstanding  persistent arrhythmia, essentially asymptomatic until now.  Has been going on for at least 4 years.  Good rate control fortuitously from beta-blocker therapy, which she has been receiving for migraines.  Continue anticoagulation.  If coronary angiography is necessary and not emergent , we will have to stop the Eliquis for 24 hours.  No plan for cardioversion or antiarrhythmics. Eliquis: CHA2DS2-VASc score of 2 (age and gender).  Has not had   any bleeding issues. Moderate obesity: Unsuccessful at any weight loss in the last few months. PAH: This was mild on a previous echo.  She denies shortness of breath.  She denies daytime hypersomnolence and believes that her sleep apnea is well treated.  Does not have lower extremity edema or other signs of right heart failure.    Medication Adjustments/Labs and Tests Ordered: Current medicines are reviewed at length with the patient today.  Concerns regarding medicines are outlined above.  Medication changes, Labs and Tests ordered today are listed in the Patient Instructions below. Patient Instructions  Medication Instructions:  No changes *If you need a refill on your cardiac medications before your next appointment, please call your pharmacy*   Lab Work: None ordered If you have labs (blood work) drawn today and your tests are completely normal, you will receive your results only by: MyChart Message (if you have MyChart) OR A paper copy in the mail If you have any lab test that is abnormal or we need to change your treatment, we will call you to review the results.   Testing/Procedures: Your physician has requested that you have a lexiscan myoview. For further information please visit [email or web address]. Please follow instruction sheet, as given. This will take place at [address]  How to prepare for your Myocardial Perfusion Test: Do not eat or drink 3 hours prior to your test, except you may have water. Do not consume products  containing caffeine (regular or decaffeinated) 12 hours prior to your test. (ex: coffee, chocolate, sodas, tea). Do bring a list of your current medications with you.  If not listed below, you may take your medications as normal. Do wear comfortable clothes (no dresses or overalls) and walking shoes, tennis shoes preferred (No heels or open toe shoes are allowed). Do NOT wear cologne, perfume, aftershave, or lotions (deodorant is allowed). The test will take approximately 3 to 4 hours to complete If these instructions are not followed, your test will have to be rescheduled.    Follow-Up: At [facility], you and your health needs are our priority.  As part of our continuing mission to provide you with exceptional heart care, we have created designated Provider Care Teams.  These Care Teams include your primary Cardiologist (physician) and Advanced Practice Providers (APPs -  Physician Assistants and Nurse Practitioners) who all work together to provide you with the care you need, when you need it.  We recommend signing up for the patient portal called "MyChart".  Sign up information is provided on this After Visit Summary.  MyChart is used to connect with patients for Virtual Visits (Telemedicine).  Patients are able to view lab/test results, encounter notes, upcoming appointments, etc.  Non-urgent messages can be sent to your provider as well.   To learn more about what you can do with MyChart, go to [email or web address].    Your next appointment:   Follow up pending results     Signed, Samuella Rasool, MD  02/21/2022 4:18 PM    [facility] [address], [place], [place]  [address] Phone: [phone]; Fax: [phone]   

## 2022-02-21 NOTE — Patient Instructions (Signed)
Medication Instructions:  No changes *If you need a refill on your cardiac medications before your next appointment, please call your pharmacy*   Lab Work: None ordered If you have labs (blood work) drawn today and your tests are completely normal, you will receive your results only by: [place] (if you have MyChart) OR A paper copy in the mail If you have any lab test that is abnormal or we need to change your treatment, we will call you to review the results.   Testing/Procedures: Your physician has requested that you have a lexiscan myoview. For further information please visit [email or web address]. Please follow instruction sheet, as given. This will take place at [address]  How to prepare for your Myocardial Perfusion Test: Do not eat or drink 3 hours prior to your test, except you may have water. Do not consume products containing caffeine (regular or decaffeinated) 12 hours prior to your test. (ex: coffee, chocolate, sodas, tea). Do bring a list of your current medications with you.  If not listed below, you may take your medications as normal. Do wear comfortable clothes (no dresses or overalls) and walking shoes, tennis shoes preferred (No heels or open toe shoes are allowed). Do NOT wear cologne, perfume, aftershave, or lotions (deodorant is allowed). The test will take approximately 3 to 4 hours to complete If these instructions are not followed, your test will have to be rescheduled.    Follow-Up: At [facility], you and your health needs are our priority.  As part of our continuing mission to provide you with exceptional heart care, we have created designated Provider Care Teams.  These Care Teams include your primary Cardiologist (physician) and Advanced Practice Providers (APPs -  Physician Assistants and Nurse Practitioners) who all work together to provide you with the care you need, when you need it.  We recommend signing up for the  patient portal called "MyChart".  Sign up information is provided on this After Visit Summary.  MyChart is used to connect with patients for Virtual Visits (Telemedicine).  Patients are able to view lab/test results, encounter notes, upcoming appointments, etc.  Non-urgent messages can be sent to your provider as well.   To learn more about what you can do with MyChart, go to [email or web address].    Your next appointment:   Follow up pending results

## 2022-02-22 ENCOUNTER — Telehealth (HOSPITAL_COMMUNITY): Payer: Self-pay | Admitting: *Deleted

## 2022-02-22 NOTE — Telephone Encounter (Signed)
Patient given detailed instructions per Myocardial Perfusion Study Information Sheet for the test on 02/27/2022 at 10:15. Patient notified to arrive 15 minutes early and that it is imperative to arrive on time for appointment to keep from having the test rescheduled.  If you need to cancel or reschedule your appointment, please call the office within 24 hours of your appointment. . Patient verbalized understanding.Brandi Rodgers

## 2022-02-23 ENCOUNTER — Encounter (HOSPITAL_COMMUNITY): Payer: Medicare PPO

## 2022-02-27 ENCOUNTER — Ambulatory Visit (HOSPITAL_COMMUNITY): Payer: Medicare PPO | Attending: Cardiovascular Disease

## 2022-02-27 DIAGNOSIS — R079 Chest pain, unspecified: Secondary | ICD-10-CM | POA: Diagnosis not present

## 2022-02-27 MED ORDER — REGADENOSON 0.4 MG/5ML IV SOLN
0.4000 mg | Freq: Once | INTRAVENOUS | Status: AC
  Administered 2022-02-27: 0.4 mg via INTRAVENOUS

## 2022-02-27 MED ORDER — TECHNETIUM TC 99M TETROFOSMIN IV KIT
31.7000 | PACK | Freq: Once | INTRAVENOUS | Status: AC | PRN
  Administered 2022-02-27: 31.7 via INTRAVENOUS

## 2022-02-28 ENCOUNTER — Ambulatory Visit (HOSPITAL_COMMUNITY): Payer: Medicare PPO | Attending: Cardiovascular Disease

## 2022-02-28 ENCOUNTER — Ambulatory Visit (HOSPITAL_COMMUNITY): Payer: Medicare PPO

## 2022-02-28 LAB — MYOCARDIAL PERFUSION IMAGING
LV dias vol: 101 mL (ref 46–106)
LV sys vol: 51 mL
Nuc Stress EF: 49 %
Peak HR: 133 {beats}/min
Rest HR: 93 {beats}/min
Rest Nuclear Isotope Dose: 29.4 mCi
SDS: 2
SRS: 7
SSS: 9
ST Depression (mm): 0 mm
Stress Nuclear Isotope Dose: 31.7 mCi
TID: 1.01

## 2022-02-28 MED ORDER — TECHNETIUM TC 99M TETROFOSMIN IV KIT
29.4000 | PACK | Freq: Once | INTRAVENOUS | Status: AC | PRN
  Administered 2022-02-28: 29.4 via INTRAVENOUS

## 2022-03-01 ENCOUNTER — Telehealth: Payer: Self-pay | Admitting: *Deleted

## 2022-03-01 ENCOUNTER — Encounter: Payer: Self-pay | Admitting: *Deleted

## 2022-03-01 DIAGNOSIS — Z01812 Encounter for preprocedural laboratory examination: Secondary | ICD-10-CM | POA: Diagnosis not present

## 2022-03-01 DIAGNOSIS — R079 Chest pain, unspecified: Secondary | ICD-10-CM

## 2022-03-01 LAB — CBC
Hematocrit: 38.8 % (ref 34.0–46.6)
Hemoglobin: 13.2 g/dL (ref 11.1–15.9)
MCH: 29.5 pg (ref 26.6–33.0)
MCHC: 34 g/dL (ref 31.5–35.7)
MCV: 87 fL (ref 79–97)
Platelets: 184 10*3/uL (ref 150–450)
RBC: 4.48 x10E6/uL (ref 3.77–5.28)
RDW: 14.7 % (ref 11.7–15.4)
WBC: 6.5 10*3/uL (ref 3.4–10.8)

## 2022-03-01 LAB — BASIC METABOLIC PANEL
BUN/Creatinine Ratio: 13 (ref 12–28)
BUN: 9 mg/dL (ref 8–27)
CO2: 32 mmol/L — ABNORMAL HIGH (ref 20–29)
Calcium: 10.1 mg/dL (ref 8.7–10.3)
Chloride: 103 mmol/L (ref 96–106)
Creatinine, Ser: 0.71 mg/dL (ref 0.57–1.00)
Glucose: 90 mg/dL (ref 70–99)
Potassium: 4.2 mmol/L (ref 3.5–5.2)
Sodium: 141 mmol/L (ref 134–144)
eGFR: 91 mL/min/{1.73_m2} (ref >59)

## 2022-03-01 NOTE — Telephone Encounter (Signed)
Patient has been called and made aware. She will have STAT labs done today. Message also sent to [place].   I have already instructed her to stop Eliquis starting now (by the time of cath she will have been off Eliquis for >36 hours). Please ask her to take 325 mg aspirin today and 81 mg aspirin tomorrow.      Cardiac/Peripheral Catheterization   You are scheduled for a Cardiac Catheterization on Thursday, October 19 with Dr. [name].  1. Please arrive at the Main Entrance A at [facility]: [place], [place] [address] on October 19 at 12:30 PM (This time is two hours before your procedure to ensure your preparation). Free valet parking service is available. You will check in at ADMITTING. The support person will be asked to wait in the waiting room.  It is OK to have someone drop you off and come back when you are ready to be discharged.        Special note: Every effort is made to have your procedure done on time. Please understand that emergencies sometimes delay scheduled procedures.   . 2. Diet: Do not eat solid foods after midnight.  You may have clear liquids until 5 AM the day of the procedure.  3. Labs: You will need to have blood drawn on 10/18, STAT labs. You do not need to be fasting.  4. Medication instructions in preparation for your procedure: Hold Eliquis starting now and the morning of the cath  On the morning of your procedure, take Aspirin 81 mg and any morning medicines NOT listed above.  You may use sips of water.  5. Plan to go home the same day, you will only stay overnight if medically necessary. 6. You MUST have a responsible adult to drive you home. 7. An adult MUST be with you the first 24 hours after you arrive home. 8. Bring a current list of your medications, and the last time and date medication taken. 9. Bring ID and current insurance cards. 10.Please wear clothes that are easy to get on and off and wear slip-on  shoes.  Thank you for allowing [place] to care for you!   -- [facility]

## 2022-03-01 NOTE — Telephone Encounter (Signed)
-----   Message from Sanda Klein, MD sent at 03/01/2022  8:15 AM EDT ----- Scheduled for cath tomorrow Thursday '@2'$ :30PM w Dr. Darlina Guys. Please arrive at 12:30 PM. Discussed risks/benefits w Mrs. Rund by phone yesterday. Shared Decision Making/Informed Consent The risks [stroke (1 in 1000), death (1 in 1000), kidney failure [usually temporary] (1 in 500), bleeding (1 in 200), allergic reaction [possibly serious] (1 in 200)], benefits (diagnostic support and management of coronary artery disease) and alternatives of a cardiac catheterization were discussed in detail with Ms. Reierson and she is willing to proceed.   Lattie Haw, I have scheduled w cath lab. Can you please give Mrs. Villatoro all the detailed instructions

## 2022-03-02 ENCOUNTER — Encounter (HOSPITAL_COMMUNITY)
Admission: RE | Disposition: A | Discharge status: Home / Self Care | Payer: Self-pay | Source: Home / Self Care | Attending: Cardiovascular Disease

## 2022-03-02 ENCOUNTER — Other Ambulatory Visit: Payer: Self-pay

## 2022-03-02 ENCOUNTER — Ambulatory Visit (HOSPITAL_COMMUNITY)
Admission: RE | Admit: 2022-03-02 | Discharge: 2022-03-02 | Disposition: A | Discharge status: Home / Self Care | Payer: Medicare PPO | Attending: Cardiovascular Disease | Admitting: Cardiovascular Disease

## 2022-03-02 DIAGNOSIS — G4730 Sleep apnea, unspecified: Secondary | ICD-10-CM | POA: Diagnosis not present

## 2022-03-02 DIAGNOSIS — I25110 Atherosclerotic heart disease of native coronary artery with unstable angina pectoris: Secondary | ICD-10-CM | POA: Insufficient documentation

## 2022-03-02 DIAGNOSIS — D6859 Other primary thrombophilia: Secondary | ICD-10-CM | POA: Diagnosis not present

## 2022-03-02 DIAGNOSIS — R9439 Abnormal result of other cardiovascular function study: Secondary | ICD-10-CM | POA: Diagnosis not present

## 2022-03-02 DIAGNOSIS — I4811 Longstanding persistent atrial fibrillation: Secondary | ICD-10-CM | POA: Diagnosis not present

## 2022-03-02 DIAGNOSIS — Z6838 Body mass index (BMI) 38.0-38.9, adult: Secondary | ICD-10-CM | POA: Diagnosis not present

## 2022-03-02 DIAGNOSIS — G43909 Migraine, unspecified, not intractable, without status migrainosus: Secondary | ICD-10-CM | POA: Diagnosis not present

## 2022-03-02 DIAGNOSIS — Z7901 Long term (current) use of anticoagulants: Secondary | ICD-10-CM | POA: Diagnosis not present

## 2022-03-02 DIAGNOSIS — I2510 Atherosclerotic heart disease of native coronary artery without angina pectoris: Secondary | ICD-10-CM

## 2022-03-02 DIAGNOSIS — Z79899 Other long term (current) drug therapy: Secondary | ICD-10-CM | POA: Diagnosis not present

## 2022-03-02 DIAGNOSIS — R079 Chest pain, unspecified: Secondary | ICD-10-CM

## 2022-03-02 HISTORY — PX: LEFT HEART CATH AND CORONARY ANGIOGRAPHY: CATH118249

## 2022-03-02 SURGERY — LEFT HEART CATH AND CORONARY ANGIOGRAPHY
Anesthesia: LOCAL

## 2022-03-02 MED ORDER — SODIUM CHLORIDE 0.9 % WEIGHT BASED INFUSION
3.0000 mL/kg/h | INTRAVENOUS | Status: AC
  Administered 2022-03-02: 3 mL/kg/h via INTRAVENOUS

## 2022-03-02 MED ORDER — SODIUM CHLORIDE 0.9 % IV SOLN: 250.0000 mL | INTRAVENOUS | Status: DC | PRN

## 2022-03-02 MED ORDER — LIDOCAINE HCL (PF) 1 % IJ SOLN
INTRAMUSCULAR | Status: DC | PRN
  Administered 2022-03-02: 2 mL

## 2022-03-02 MED ORDER — ASPIRIN 81 MG PO CHEW: 81.0000 mg | CHEWABLE_TABLET | ORAL | Status: DC

## 2022-03-02 MED ORDER — HEPARIN SODIUM (PORCINE) 1000 UNIT/ML IJ SOLN
INTRAMUSCULAR | Status: AC
  Filled 2022-03-02: qty 10

## 2022-03-02 MED ORDER — HYDRALAZINE HCL 20 MG/ML IJ SOLN: 10.0000 mg | INTRAMUSCULAR | Status: DC | PRN

## 2022-03-02 MED ORDER — VERAPAMIL HCL 2.5 MG/ML IV SOLN
INTRAVENOUS | Status: AC
  Filled 2022-03-02: qty 2

## 2022-03-02 MED ORDER — HEPARIN (PORCINE) IN NACL 1000-0.9 UT/500ML-% IV SOLN
INTRAVENOUS | Status: AC
  Filled 2022-03-02: qty 1000

## 2022-03-02 MED ORDER — FENTANYL CITRATE (PF) 100 MCG/2ML IJ SOLN
INTRAMUSCULAR | Status: AC
  Filled 2022-03-02: qty 2

## 2022-03-02 MED ORDER — LABETALOL HCL 5 MG/ML IV SOLN: 10.0000 mg | INTRAVENOUS | Status: DC | PRN

## 2022-03-02 MED ORDER — SODIUM CHLORIDE 0.9% FLUSH: 3.0000 mL | Freq: Two times a day (BID) | INTRAVENOUS | Status: DC

## 2022-03-02 MED ORDER — SODIUM CHLORIDE 0.9 % IV SOLN: INTRAVENOUS | Status: AC

## 2022-03-02 MED ORDER — IOHEXOL 350 MG/ML SOLN
INTRAVENOUS | Status: DC | PRN
  Administered 2022-03-02: 40 mL

## 2022-03-02 MED ORDER — SODIUM CHLORIDE 0.9% FLUSH: 3.0000 mL | INTRAVENOUS | Status: DC | PRN

## 2022-03-02 MED ORDER — ACETAMINOPHEN 325 MG PO TABS: 650.0000 mg | ORAL_TABLET | ORAL | Status: DC | PRN

## 2022-03-02 MED ORDER — FENTANYL CITRATE (PF) 100 MCG/2ML IJ SOLN
INTRAMUSCULAR | Status: DC | PRN
  Administered 2022-03-02: 25 ug via INTRAVENOUS

## 2022-03-02 MED ORDER — HEPARIN SODIUM (PORCINE) 1000 UNIT/ML IJ SOLN
INTRAMUSCULAR | Status: DC | PRN
  Administered 2022-03-02: 5000 [IU] via INTRAVENOUS

## 2022-03-02 MED ORDER — HEPARIN (PORCINE) IN NACL 1000-0.9 UT/500ML-% IV SOLN
INTRAVENOUS | Status: DC | PRN
  Administered 2022-03-02 (×2): 500 mL

## 2022-03-02 MED ORDER — MIDAZOLAM HCL 2 MG/2ML IJ SOLN
INTRAMUSCULAR | Status: DC | PRN
  Administered 2022-03-02: 2 mg via INTRAVENOUS

## 2022-03-02 MED ORDER — VERAPAMIL HCL 2.5 MG/ML IV SOLN
INTRAVENOUS | Status: DC | PRN
  Administered 2022-03-02: 10 mL via INTRA_ARTERIAL

## 2022-03-02 MED ORDER — LIDOCAINE HCL (PF) 1 % IJ SOLN
INTRAMUSCULAR | Status: AC
  Filled 2022-03-02: qty 30

## 2022-03-02 MED ORDER — MIDAZOLAM HCL 2 MG/2ML IJ SOLN
INTRAMUSCULAR | Status: AC
  Filled 2022-03-02: qty 2

## 2022-03-02 MED ORDER — SODIUM CHLORIDE 0.9 % WEIGHT BASED INFUSION: 1.0000 mL/kg/h | INTRAVENOUS | Status: DC

## 2022-03-02 MED ORDER — ONDANSETRON HCL 4 MG/2ML IJ SOLN: 4.0000 mg | Freq: Four times a day (QID) | INTRAMUSCULAR | Status: DC | PRN

## 2022-03-02 SURGICAL SUPPLY — 9 items
BAND ZEPHYR COMPRESS 30 LONG (HEMOSTASIS) IMPLANT
CATH 5FR JL3.5 JR4 ANG PIG MP (CATHETERS) IMPLANT
GLIDESHEATH SLEND SS 6F .021 (SHEATH) IMPLANT
GUIDEWIRE INQWIRE 1.5J.035X260 (WIRE) IMPLANT
INQWIRE 1.5J .035X260CM (WIRE) ×1
KIT HEART LEFT (KITS) ×1 IMPLANT
PACK CARDIAC CATHETERIZATION (CUSTOM PROCEDURE TRAY) ×1 IMPLANT
TRANSDUCER W/STOPCOCK (MISCELLANEOUS) ×1 IMPLANT
TUBING CIL FLEX 10 FLL-RA (TUBING) ×1 IMPLANT

## 2022-03-02 NOTE — Interval H&P Note (Signed)
History and Physical Interval Note:  03/02/2022 12:16 PM  Brandi Rodgers  has presented today for surgery, with the diagnosis of abnormal neuclear test.  The various methods of treatment have been discussed with the patient and family. After consideration of risks, benefits and other options for treatment, the patient has consented to  Procedure(s): LEFT HEART CATH AND CORONARY ANGIOGRAPHY (N/A) as a surgical intervention.  The patient's history has been reviewed, patient examined, no change in status, stable for surgery.  I have reviewed the patient's chart and labs.  Questions were answered to the patient's satisfaction.    Cath Lab Visit (complete for each Cath Lab visit)  Clinical Evaluation Leading to the Procedure:   ACS: No.  Non-ACS:    Anginal Classification: CCS II  Anti-ischemic medical therapy: Minimal Therapy (1 class of medications)  Non-Invasive Test Results: Intermediate-risk stress test findings: cardiac mortality 1-3%/year  Prior CABG: No previous CABG        Brandi Rodgers

## 2022-03-02 NOTE — Discharge Instructions (Addendum)
Resume Eliquis tomorrow if no bleeding from cath site.   Radial Site Care Drink plenty of fluid for the next 3 days  Keep arm elevated for the next 24 hours The following information offers guidance on how to care for yourself after your procedure. Your health care provider may also give you more specific instructions. If you have problems or questions, contact your health care provider. What can I expect after the procedure? After the procedure, it is common to have bruising and tenderness in the incision area. Follow these instructions at home: Incision site care  Follow instructions from your health care provider about how to take care of your incision site. Make sure you:  Remove your dressing 24 hours after discharge.  Do not take baths, swim, or use a hot tub for 1 week. You may shower 24 hours after the procedure or as told by your health care provider. Remove the dressing and gently wash the incision area with plain soap and water. Pat the area dry with a clean towel. Do not rub the site. That could cause bleeding. Do not apply powder or lotion to the site. Check your incision site every day for signs of infection. Check for: Redness, swelling, or pain. Fluid or blood. Warmth. Pus or a bad smell. Activity For 24 hours after the procedure, or as directed by your health care provider: Do not flex or bend the affected arm. Do not push or pull heavy objects with the affected arm. Do not operate machinery or power tools. Do not drive. You should not drive yourself home from the hospital or clinic if you go home during that time period. You may drive 24 hours after the procedure unless your health care provider tells you not to. Do not lift anything that is heavier than 10 lb (4.5 kg), or the limit that you are told, until your health care provider says that it is safe. Return to your normal activities as told by your health care provider. Ask your health care provider what  activities are safe for you and when you can return to work. If you were given a sedative during the procedure, it can affect you for several hours. Do not drive or operate machinery until your health care provider says that it is safe. General instructions Take over-the-counter and prescription medicines only as told by your health care provider. If you will be going home right after the procedure, plan to have a responsible adult care for you for the time you are told. This is important. Keep all follow-up visits. This is important. Contact a health care provider if: You have a fever or chills. You have any of these signs of infection at your incision site: Redness, swelling, or pain. Fluid or blood. Warmth. Pus or a bad smell. Get help right away if: The incision area swells very fast. The incision area is bleeding, and the bleeding does not stop when you hold steady pressure on the area. Your arm or hand becomes pale, cool, tingly, or numb. These symptoms may represent a serious problem that is an emergency. Do not wait to see if the symptoms will go away. Get medical help right away. Call your local emergency services (911 in the [place] Do not drive yourself to the hospital. Summary After the procedure, it is common to have bruising and tenderness at the incision site. Follow instructions from your health care provider about how to take care of your radial site incision. Check the incision every  day for signs of infection. Do not lift anything that is heavier than 10 lb (4.5 kg), or the limit that you are told, until your health care provider says that it is safe. Get help right away if the incision area swells very fast, you have bleeding at the incision site that will not stop, or your arm or hand becomes pale, cool, or numb. This information is not intended to replace advice given to you by your health care provider. Make sure you discuss any questions you have with your health care  provider. Document Revised: 06/20/2020 Document Reviewed: 06/20/2020 Elsevier Patient Education  [place].

## 2022-03-03 ENCOUNTER — Encounter (HOSPITAL_COMMUNITY): Payer: Self-pay | Admitting: Cardiovascular Disease

## 2022-03-06 ENCOUNTER — Telehealth: Payer: Self-pay | Admitting: Cardiovascular Disease

## 2022-03-06 NOTE — Telephone Encounter (Signed)
Patient called stating she did not have to get a stent when she had her recent procedure and she would like to know what are her next steps.

## 2022-03-06 NOTE — Telephone Encounter (Signed)
The best approach for this situation is watchful waiting

## 2022-03-06 NOTE — Telephone Encounter (Signed)
Called patient, she states she had her heart cath she would like to know her next steps- per recommendations on cath report: Recommendations: Medical management of presumed SCAD (spontaneous coronary dissection). Since she is on Eliquis, will not start an ASA. She has had no chest pain over the past week. Continue beta blocker.   Patient denies chest pain, states she did have a little over the weekend, but it went away and was not bad. She would like to know what Dr.C recommends for her. Advised I would route to MD.   Thanks!

## 2022-03-06 NOTE — Telephone Encounter (Signed)
Called patient LVM, advised of message from MD.  Left call back number

## 2022-03-15 DIAGNOSIS — I83893 Varicose veins of bilateral lower extremities with other complications: Secondary | ICD-10-CM | POA: Diagnosis not present

## 2022-03-27 DIAGNOSIS — I872 Venous insufficiency (chronic) (peripheral): Secondary | ICD-10-CM | POA: Diagnosis not present

## 2022-03-27 DIAGNOSIS — R600 Localized edema: Secondary | ICD-10-CM | POA: Diagnosis not present

## 2022-05-02 DIAGNOSIS — L821 Other seborrheic keratosis: Secondary | ICD-10-CM | POA: Diagnosis not present

## 2022-05-02 DIAGNOSIS — D225 Melanocytic nevi of trunk: Secondary | ICD-10-CM | POA: Diagnosis not present

## 2022-05-02 DIAGNOSIS — L814 Other melanin hyperpigmentation: Secondary | ICD-10-CM | POA: Diagnosis not present

## 2022-05-02 DIAGNOSIS — L218 Other seborrheic dermatitis: Secondary | ICD-10-CM | POA: Diagnosis not present

## 2022-05-02 DIAGNOSIS — L578 Other skin changes due to chronic exposure to nonionizing radiation: Secondary | ICD-10-CM | POA: Diagnosis not present

## 2022-05-02 DIAGNOSIS — L820 Inflamed seborrheic keratosis: Secondary | ICD-10-CM | POA: Diagnosis not present

## 2022-05-02 DIAGNOSIS — L309 Dermatitis, unspecified: Secondary | ICD-10-CM | POA: Diagnosis not present

## 2022-05-02 DIAGNOSIS — L570 Actinic keratosis: Secondary | ICD-10-CM | POA: Diagnosis not present

## 2022-05-24 DIAGNOSIS — M65342 Trigger finger, left ring finger: Secondary | ICD-10-CM | POA: Diagnosis not present

## 2022-05-31 DIAGNOSIS — M25511 Pain in right shoulder: Secondary | ICD-10-CM | POA: Diagnosis not present

## 2022-06-23 DIAGNOSIS — U071 COVID-19: Secondary | ICD-10-CM | POA: Diagnosis not present

## 2022-07-06 DIAGNOSIS — N3000 Acute cystitis without hematuria: Secondary | ICD-10-CM | POA: Diagnosis not present

## 2022-07-06 DIAGNOSIS — C50911 Malignant neoplasm of unspecified site of right female breast: Secondary | ICD-10-CM | POA: Diagnosis not present

## 2022-07-06 DIAGNOSIS — D6869 Other thrombophilia: Secondary | ICD-10-CM | POA: Diagnosis not present

## 2022-07-06 DIAGNOSIS — I480 Paroxysmal atrial fibrillation: Secondary | ICD-10-CM | POA: Diagnosis not present

## 2022-07-06 DIAGNOSIS — I2510 Atherosclerotic heart disease of native coronary artery without angina pectoris: Secondary | ICD-10-CM | POA: Diagnosis not present

## 2022-07-06 DIAGNOSIS — Z853 Personal history of malignant neoplasm of breast: Secondary | ICD-10-CM | POA: Diagnosis not present

## 2022-07-10 DIAGNOSIS — Z03818 Encounter for observation for suspected exposure to other biological agents ruled out: Secondary | ICD-10-CM | POA: Diagnosis not present

## 2022-07-10 DIAGNOSIS — I10 Essential (primary) hypertension: Secondary | ICD-10-CM | POA: Diagnosis not present

## 2022-07-10 DIAGNOSIS — R051 Acute cough: Secondary | ICD-10-CM | POA: Diagnosis not present

## 2022-07-10 DIAGNOSIS — J069 Acute upper respiratory infection, unspecified: Secondary | ICD-10-CM | POA: Diagnosis not present

## 2022-08-12 ENCOUNTER — Other Ambulatory Visit: Payer: Self-pay | Admitting: Cardiovascular Disease

## 2022-08-14 NOTE — Telephone Encounter (Signed)
Prescription refill request for Eliquis received. Indication: PAF Last office visit: 02/21/22  Jerilynn Mages Croitoru MD Scr: 0.71 on 03/01/22  Epic Age: 72 Weight: 115.5kg  Based on above findings Eliquis 5mg  twice daily is the appropriate dose.  Refill approved.

## 2022-08-21 ENCOUNTER — Encounter (INDEPENDENT_AMBULATORY_CARE_PROVIDER_SITE_OTHER): Payer: Medicare PPO | Admitting: Ophthalmology

## 2022-08-22 DIAGNOSIS — I10 Essential (primary) hypertension: Secondary | ICD-10-CM | POA: Diagnosis not present

## 2022-08-22 DIAGNOSIS — J019 Acute sinusitis, unspecified: Secondary | ICD-10-CM | POA: Diagnosis not present

## 2022-08-23 ENCOUNTER — Encounter (INDEPENDENT_AMBULATORY_CARE_PROVIDER_SITE_OTHER): Payer: Medicare PPO | Admitting: Ophthalmology

## 2022-08-23 DIAGNOSIS — H35371 Puckering of macula, right eye: Secondary | ICD-10-CM

## 2022-08-23 DIAGNOSIS — H35033 Hypertensive retinopathy, bilateral: Secondary | ICD-10-CM

## 2022-08-23 DIAGNOSIS — Z961 Presence of intraocular lens: Secondary | ICD-10-CM

## 2022-08-23 DIAGNOSIS — I10 Essential (primary) hypertension: Secondary | ICD-10-CM

## 2022-09-01 DIAGNOSIS — L578 Other skin changes due to chronic exposure to nonionizing radiation: Secondary | ICD-10-CM | POA: Diagnosis not present

## 2022-09-01 DIAGNOSIS — D485 Neoplasm of uncertain behavior of skin: Secondary | ICD-10-CM | POA: Diagnosis not present

## 2022-09-01 DIAGNOSIS — Z09 Encounter for follow-up examination after completed treatment for conditions other than malignant neoplasm: Secondary | ICD-10-CM | POA: Diagnosis not present

## 2022-09-01 DIAGNOSIS — L309 Dermatitis, unspecified: Secondary | ICD-10-CM | POA: Diagnosis not present

## 2022-09-01 DIAGNOSIS — L570 Actinic keratosis: Secondary | ICD-10-CM | POA: Diagnosis not present

## 2022-09-04 ENCOUNTER — Encounter (INDEPENDENT_AMBULATORY_CARE_PROVIDER_SITE_OTHER): Payer: Self-pay | Admitting: Ophthalmology

## 2022-09-04 ENCOUNTER — Ambulatory Visit (INDEPENDENT_AMBULATORY_CARE_PROVIDER_SITE_OTHER): Payer: Medicare PPO | Admitting: Ophthalmology

## 2022-09-04 DIAGNOSIS — Z961 Presence of intraocular lens: Secondary | ICD-10-CM

## 2022-09-04 DIAGNOSIS — H35371 Puckering of macula, right eye: Secondary | ICD-10-CM

## 2022-09-04 DIAGNOSIS — H35033 Hypertensive retinopathy, bilateral: Secondary | ICD-10-CM

## 2022-09-04 DIAGNOSIS — I10 Essential (primary) hypertension: Secondary | ICD-10-CM | POA: Diagnosis not present

## 2022-09-04 NOTE — Progress Notes (Signed)
[facility] - Clinic Note  09/04/2022     CHIEF COMPLAINT Patient presents for Retina Follow Up   HISTORY OF PRESENT ILLNESS: Brandi Rodgers is a 72 y.o. female who presents to the clinic today for:   HPI     Retina Follow Up   In right eye.  This started 1 year ago.  Duration of 1 year.  Since onset it is stable.  I, the attending physician,  performed the HPI with the patient and updated documentation appropriately.        Comments   1 year ERM OD pt is reporting no vision changes noticed she has noticed some floaters that are without changes but denies any flashes of light       Last edited by Rennis Chris, MD on 09/05/2022  9:09 PM.    Patient  Referring physician: Mila Palmer, MD [address] [place],  [place] [address]  HISTORICAL INFORMATION:   Selected notes from the MEDICAL RECORD NUMBER Referred by Dr. [place] Schimke for concern of ERM OD   CURRENT MEDICATIONS: Current Outpatient Medications (Ophthalmic Drugs)  Medication Sig   carboxymethylcellulose (REFRESH PLUS) 0.5 % SOLN Place 1 drop into both eyes 3 (three) times daily as needed (Dry Eyes).   No current facility-administered medications for this visit. (Ophthalmic Drugs)   Current Outpatient Medications (Other)  Medication Sig   acetaminophen (TYLENOL) 325 MG tablet Take 650 mg by mouth every 6 (six) hours as needed for moderate pain.   atenolol (TENORMIN) 50 MG tablet Take 50 mg by mouth daily.    bismuth subsalicylate (PEPTO BISMOL) 262 MG chewable tablet Chew 524 mg by mouth as needed for diarrhea or loose stools or indigestion.   calcium carbonate (TUMS - DOSED IN MG ELEMENTAL CALCIUM) 500 MG chewable tablet Chew 1 tablet by mouth daily as needed for indigestion or heartburn.   Calcium-Vitamin D-Vitamin K (VIACTIV PO) Take 1 tablet by mouth daily.   ELIQUIS 5 MG TABS tablet TAKE 1 TABLET BY MOUTH TWICE A DAY   gabapentin (NEURONTIN) 600 MG tablet Take 1  tablet (600 mg total) by mouth 2 (two) times daily.   letrozole (FEMARA) 2.5 MG tablet TAKE 1 TABLET BY MOUTH EVERY DAY   Multiple Vitamin (MULTI-VITAMIN DAILY PO) Take 1 tablet by mouth daily.   rizatriptan (MAXALT-MLT) 10 MG disintegrating tablet Take 10 mg by mouth as needed for migraine. Reported on 09/14/2015   valACYclovir (VALTREX) 1000 MG tablet Take 1,000 mg by mouth 3 (three) times daily as needed (Fever blisters).   No current facility-administered medications for this visit. (Other)   REVIEW OF SYSTEMS: ROS   Positive for: Musculoskeletal, Eyes Negative for: Constitutional, Gastrointestinal, Neurological, Skin, Genitourinary, HENT, Endocrine, Cardiovascular, Respiratory, Psychiatric, Allergic/Imm, Heme/Lymph Last edited by Etheleen Mayhew, COT on 09/04/2022  8:22 AM.     ALLERGIES Allergies  Allergen Reactions   Methocarbamol Hives   Augmentin [Amoxicillin-Pot Clavulanate] Diarrhea   Nsaids     On Blood Thinner   PAST MEDICAL HISTORY Past Medical History:  Diagnosis Date   Arthritis    cervical and lumbar spine   Breast cancer of lower-outer quadrant of right female breast 07/02/2015   Degenerative lumbar disc    Dental crowns present    GERD (gastroesophageal reflux disease)    Hypercholesterolemia    borderline - no current med.   Jaw clicking    Migraines    Osteoarthritis    bilateral knee  Paroxysmal atrial fibrillation    last episode was 01/2014   Radiation 09/07/15-10/26/15   right breast 50.4 Gy, boost to 10 Gy   Sleep apnea    uses mouth guard at night; no CPAP   Past Surgical History:  Procedure Laterality Date   CARPAL TUNNEL RELEASE Right 05/13/2003   DILATION AND CURETTAGE OF UTERUS     KNEE ARTHROSCOPY Right 04/06/2003   KNEE ARTHROSCOPY Left 11/30/2003   LEFT HEART CATH AND CORONARY ANGIOGRAPHY N/A 03/02/2022   Procedure: LEFT HEART CATH AND CORONARY ANGIOGRAPHY;  Surgeon: Kathleene Hazel, MD;  Location: [facility];   Service: Cardiovascular;  Laterality: N/A;   RADIOACTIVE SEED GUIDED PARTIAL MASTECTOMY WITH AXILLARY SENTINEL LYMPH NODE BIOPSY Right 07/16/2015   Procedure: RADIOACTIVE SEED GUIDED PARTIAL MASTECTOMY WITH AXILLARY SENTINEL LYMPH NODE BIOPSY;  Surgeon: Claud Kelp, MD;  Location: [facility];  Service: General;  Laterality: Right;   TOTAL KNEE ARTHROPLASTY Left 03/23/2014   Procedure: LEFT TOTAL KNEE ARTHROPLASTY ;  Surgeon: Loanne Drilling, MD;  Location: [facility];  Service: Orthopedics;  Laterality: Left;   TOTAL KNEE ARTHROPLASTY Right 08/21/2014   Procedure: RIGHT TOTAL KNEE ARTHROPLASTY;  Surgeon: Ollen Gross, MD;  Location: [facility];  Service: Orthopedics;  Laterality: Right;   FAMILY HISTORY Family History  Problem Relation Age of Onset   Lung cancer Mother    Thyroid cancer Mother    SOCIAL HISTORY Social History   Tobacco Use   Smoking status: Never   Smokeless tobacco: Never  Vaping Use   Vaping Use: Never used  Substance Use Topics   Alcohol use: Yes    Comment: occasionally   Drug use: No       OPHTHALMIC EXAM:  Base Eye Exam     Visual Acuity (Snellen - Linear)       Right Left   Dist [place] 20/30 20/20 -1   Dist ph  NI          Tonometry (Tonopen, 8:26 AM)       Right Left   Pressure 14 14         Pupils       Dark Light Shape React APD   Right 4 2 Round Brisk None   Left 4 2 Round Brisk None         Visual Fields       Left Right    Full Full         Extraocular Movement       Right Left    Full, Ortho Full, Ortho         Neuro/Psych     Oriented x3: Yes   Mood/Affect: Normal         Dilation     Both eyes: 2.5% Phenylephrine @ 8:26 AM           Slit Lamp and Fundus Exam     Slit Lamp Exam       Right Left   Lids/Lashes Dermatochalasis - upper lid Dermatochalasis - upper lid   Conjunctiva/Sclera White and quiet mild temporal Pinguecula   Cornea mild Arcus, well healed cataract wound, mild tear  film debris Trace Punctate epithelial erosions inferiorly, tear film debris, mild Arcus, well healed cataract wound   Anterior Chamber Deep and quiet Deep and quiet   Iris Round and dilated Round and dilated   Lens PC IOL in perfect position (vivity), open PC PC IOL in excellent postion (viviity), open PC  Anterior Vitreous Vitreous syneresis, Posterior vitreous detachment, mild Asteroid hyalosis inferiorly Vitreous syneresis         Fundus Exam       Right Left   Disc Trace pallor, sharp rim, mild temporal PPA Compact, Pink and Sharp, mild temporal PPA   C/D Ratio 0.5 0.6   Macula Flat, Blunted foveal reflex, Epiretinal membrane with mild striae -- stable, no heme Flat, Blunted foveal reflex, trace ERM SN mac, mild RPE mottling, No heme or edema   Vessels mild tortuosity mild tortuosity   Periphery Attached, reticular degeneration, paving stone degeneration inferior and temporal periphery, No RT/RD, No heme Attached, reticular degeneration, paving stone degeneration inferior and temporal periphery, No RT/RD, No heme           IMAGING AND PROCEDURES  Imaging and Procedures for @  OCT, Retina - OU - Both Eyes       Right Eye Quality was good. Central Foveal Thickness: 373. Progression has been stable. Findings include no IRF, no SRF, abnormal foveal contour, epiretinal membrane, macular pucker (Stable ERM).   Left Eye Quality was good. Central Foveal Thickness: 276. Progression has been stable. Findings include normal foveal contour, no IRF, no SRF (Trace ERM superiorly--stable).   Notes *Images captured and stored on drive  Diagnosis / Impression:  OD: +ERM with pucker--stable from prior OS: NFP, no IRF/SRF--stable  Clinical management:  See below  Abbreviations: NFP - Normal foveal profile. CME - cystoid macular edema. PED - pigment epithelial detachment. IRF - intraretinal fluid. SRF - subretinal fluid. EZ - ellipsoid zone. ERM - epiretinal membrane. ORA - outer  retinal atrophy. ORT - outer retinal tubulation. SRHM - subretinal hyper-reflective material            ASSESSMENT/PLAN:    ICD-10-CM   1. Epiretinal membrane (ERM) of right eye  H35.371 OCT, Retina - OU - Both Eyes    2. Essential hypertension  I10     3. Hypertensive retinopathy of both eyes  H35.033     4. Pseudophakia of both eyes  Z96.1      1. Epiretinal membrane, OD  - mild ERM -- no significant change from prior  - BCVA 20/30 OD -- stable  - asymptomatic, no metamorphopsia  - no indication for surgery at this time  - monitor for now  - had CE with IOL OU w/ [facility] (OD: 05.18.21, OS: 05.25.21), vision improved and pt very happy  - f/u 1 year, sooner prn -- DFE/OCT  2,3. Hypertensive retinopathy OU  - discussed importance of tight BP control  - monitor  4. Pseudophakia OU  - s/p CE/IOL OU (OD: 05.18.21, OS: 05.25.21, Vivity OU, [facility])  - s/p YAG cap OU  - beautiful surgeries, doing well  - monitor   Ophthalmic Meds Ordered this visit:  No orders of the defined types were placed in this encounter.    Return in about 1 year (around 09/04/2023) for f/u ERM OD, DFE, OCT.  There are no Patient Instructions on file for this visit.   This document serves as a record of services personally performed by Karie Chimera, MD, PhD. It was created on their behalf by Annalee Genta, COMT. The creation of this record is the provider's dictation and/or activities during the visit.  Electronically signed by: Annalee Genta, COMT 09/05/22 9:10 PM  This document serves as a record of services personally performed by Karie Chimera, MD, PhD. It was created on their behalf by Glee Arvin. Manson Passey,  OA an ophthalmic technician. The creation of this record is the provider's dictation and/or activities during the visit.    Electronically signed by: Glee Arvin. Manson Passey, [place] 04.22.2024 9:10 PM   Karie Chimera, M.D., Ph.D. Diseases & Surgery of the Retina and Vitreous [facility]  I have reviewed the above documentation for accuracy and completeness, and I agree with the above. Karie Chimera, M.D., Ph.D. 09/05/22 9:11 PM   Abbreviations: M myopia (nearsighted); A astigmatism; H hyperopia (farsighted); P presbyopia; Mrx spectacle prescription;  CTL contact lenses; OD right eye; OS left eye; OU both eyes  XT exotropia; ET esotropia; PEK punctate epithelial keratitis; PEE punctate epithelial erosions; DES dry eye syndrome; MGD meibomian gland dysfunction; ATs artificial tears; PFAT's preservative free artificial tears; NSC nuclear sclerotic cataract; PSC posterior subcapsular cataract; ERM epi-retinal membrane; PVD posterior vitreous detachment; RD retinal detachment; DM diabetes mellitus; DR diabetic retinopathy; NPDR non-proliferative diabetic retinopathy; PDR proliferative diabetic retinopathy; CSME clinically significant macular edema; DME diabetic macular edema; dbh dot blot hemorrhages; CWS cotton wool spot; POAG primary open angle glaucoma; C/D cup-to-disc ratio; HVF humphrey visual field; GVF goldmann visual field; OCT optical coherence tomography; IOP intraocular pressure; BRVO Branch retinal vein occlusion; CRVO central retinal vein occlusion; CRAO central retinal artery occlusion; BRAO branch retinal artery occlusion; RT retinal tear; SB scleral buckle; PPV pars plana vitrectomy; VH Vitreous hemorrhage; PRP panretinal laser photocoagulation; IVK intravitreal kenalog; VMT vitreomacular traction; MH Macular hole;  NVD neovascularization of the disc; NVE neovascularization elsewhere; AREDS age related eye disease study; ARMD age related macular degeneration; POAG primary open angle glaucoma; EBMD epithelial/anterior basement membrane dystrophy; ACIOL anterior chamber intraocular lens; IOL intraocular lens; PCIOL posterior chamber intraocular lens; Phaco/IOL phacoemulsification with intraocular lens placement; PRK photorefractive keratectomy; LASIK  laser assisted in situ keratomileusis; HTN hypertension; DM diabetes mellitus; COPD chronic obstructive pulmonary disease

## 2022-09-05 ENCOUNTER — Encounter (INDEPENDENT_AMBULATORY_CARE_PROVIDER_SITE_OTHER): Payer: Self-pay | Admitting: Ophthalmology

## 2022-09-27 DIAGNOSIS — Z1231 Encounter for screening mammogram for malignant neoplasm of breast: Secondary | ICD-10-CM | POA: Diagnosis not present

## 2022-09-28 DIAGNOSIS — M7062 Trochanteric bursitis, left hip: Secondary | ICD-10-CM | POA: Diagnosis not present

## 2022-09-29 ENCOUNTER — Encounter: Payer: Self-pay | Admitting: Hematology and Oncology

## 2022-09-29 DIAGNOSIS — I83893 Varicose veins of bilateral lower extremities with other complications: Secondary | ICD-10-CM | POA: Diagnosis not present

## 2022-09-29 DIAGNOSIS — R600 Localized edema: Secondary | ICD-10-CM | POA: Diagnosis not present

## 2022-10-19 DIAGNOSIS — I83891 Varicose veins of right lower extremities with other complications: Secondary | ICD-10-CM | POA: Diagnosis not present

## 2022-10-23 DIAGNOSIS — I83892 Varicose veins of left lower extremities with other complications: Secondary | ICD-10-CM | POA: Diagnosis not present

## 2022-10-30 DIAGNOSIS — I87391 Chronic venous hypertension (idiopathic) with other complications of right lower extremity: Secondary | ICD-10-CM | POA: Diagnosis not present

## 2022-10-30 DIAGNOSIS — I83891 Varicose veins of right lower extremities with other complications: Secondary | ICD-10-CM | POA: Diagnosis not present

## 2022-11-07 DIAGNOSIS — I87392 Chronic venous hypertension (idiopathic) with other complications of left lower extremity: Secondary | ICD-10-CM | POA: Diagnosis not present

## 2022-11-07 DIAGNOSIS — I83892 Varicose veins of left lower extremities with other complications: Secondary | ICD-10-CM | POA: Diagnosis not present

## 2022-11-13 DIAGNOSIS — I83811 Varicose veins of right lower extremities with pain: Secondary | ICD-10-CM | POA: Diagnosis not present

## 2022-11-13 DIAGNOSIS — M7989 Other specified soft tissue disorders: Secondary | ICD-10-CM | POA: Diagnosis not present

## 2022-11-13 DIAGNOSIS — I83891 Varicose veins of right lower extremities with other complications: Secondary | ICD-10-CM | POA: Diagnosis not present

## 2022-11-27 DIAGNOSIS — I83892 Varicose veins of left lower extremities with other complications: Secondary | ICD-10-CM | POA: Diagnosis not present

## 2022-11-30 DIAGNOSIS — I83891 Varicose veins of right lower extremities with other complications: Secondary | ICD-10-CM | POA: Diagnosis not present

## 2022-12-10 NOTE — Progress Notes (Signed)
Patient Care Team: Mila Palmer, MD as PCP - General (Family Medicine) Thurmon Fair, MD as PCP - Cardiology (Cardiology) Salomon Fick, NP as Nurse Practitioner (Hematology and Oncology)  DIAGNOSIS:  Encounter Diagnosis  Name Primary?   Malignant neoplasm of lower-outer quadrant of right breast of female, estrogen receptor positive (HCC) Yes    SUMMARY OF ONCOLOGIC HISTORY: Oncology History  Breast cancer of lower-outer quadrant of right female breast (HCC)  06/25/2015 Initial Diagnosis   Right breast screening det focal asymmetry plus calcs 4 mm at 8:00 axilla neg, 3-D biopsy: Grade 1 IDC with DCIS and calcifications, ER 95%, PR 90% HER-2 negative ratio 1.70, KI 67:15%, clinical stage: T1aN0 Stage 1A    07/16/2015 Surgery   Rt Lumpectomy [facility]rell L[facility]ling): IDC grade 2, 0.6 cm, with DCIS, 0/4 LN, ER 95%, PR 90% HER-2 negative ratio 1.70, KI 67:15%, Path stage: T1aN0 Stage 1A , Oncotype DX score 19, 12% ROR   09/07/2015 - 10/26/2015 Radiation Therapy   Adjuvant radiation therapy ([facility]). Right breast treated to 50.4 Gy in 28 fractions.  Right breast boost treated to 10 Gy in 5 fractions    11/01/2015 -  Anti-estrogen oral therapy   Anastrozole 1 mg daily stopped March 2018 due to severe hot flashes switched to letrozole 08/28/2016 switched to exemestane 11/27/2017     CHIEF COMPLIANT: Follow-up of right breast cancer on letrozole therapy   INTERVAL HISTORY: Brandi Rodgers is a 72 y.o. with above-mentioned history of right breast cancer. She presents to the clinic today for a follow-up. Patient is completing letrozole. She states that joint stiffness is better. Still having mild hot flashes. Denies any pain or discomfort in breast. She goes to [place] and sit in the water.   ALLERGIES:  is allergic to methocarbamol, augmentin [amoxicillin-pot clavulanate], and nsaids.  MEDICATIONS:  Current Outpatient Medications  Medication Sig Dispense Refill   acetaminophen  (TYLENOL) 325 MG tablet Take 650 mg by mouth every 6 (six) hours as needed for moderate pain.     atenolol (TENORMIN) 50 MG tablet Take 50 mg by mouth daily.      bismuth subsalicylate (PEPTO BISMOL) 262 MG chewable tablet Chew 524 mg by mouth as needed for diarrhea or loose stools or indigestion.     calcium carbonate (TUMS - DOSED IN MG ELEMENTAL CALCIUM) 500 MG chewable tablet Chew 1 tablet by mouth daily as needed for indigestion or heartburn.     Calcium-Vitamin D-Vitamin K (VIACTIV PO) Take 1 tablet by mouth daily.     carboxymethylcellulose (REFRESH PLUS) 0.5 % SOLN Place 1 drop into both eyes 3 (three) times daily as needed (Dry Eyes).     ELIQUIS 5 MG TABS tablet TAKE 1 TABLET BY MOUTH TWICE A DAY 180 tablet 1   gabapentin (NEURONTIN) 600 MG tablet Take 1 tablet (600 mg total) by mouth 2 (two) times daily.     letrozole (FEMARA) 2.5 MG tablet TAKE 1 TABLET BY MOUTH EVERY DAY 90 tablet 3   Multiple Vitamin (MULTI-VITAMIN DAILY PO) Take 1 tablet by mouth daily.     rizatriptan (MAXALT-MLT) 10 MG disintegrating tablet Take 10 mg by mouth as needed for migraine. Reported on 09/14/2015     valACYclovir (VALTREX) 1000 MG tablet Take 1,000 mg by mouth 3 (three) times daily as needed (Fever blisters).     No current facility-administered medications for this visit.    PHYSICAL EXAMINATION: ECOG PERFORMANCE STATUS: 1 - Symptomatic but completely ambulatory  Vitals:  12/11/22 1019  BP: (!) 159/91  Pulse: 84  Resp: 18  Temp: 97.9 F (36.6 C)  SpO2: 94%   Filed Weights   12/11/22 1019  Weight: 262 lb 11.2 oz (119.2 kg)    BREAST: No palpable masses or nodules in either right or left breasts. No palpable axillary supraclavicular or infraclavicular adenopathy no breast tenderness or nipple discharge. (exam performed in the presence of a chaperone)  LABORATORY DATA:  I have reviewed the data as listed    Latest Ref Rng & Units 03/01/2022   10:19 AM 12/27/2015    3:24 PM 07/07/2015    12:26 PM  CMP  Glucose 70 - 99 mg/dL 90  98  91   BUN 8 - 27 mg/dL 9  9  13.0   Creatinine 0.57 - 1.00 mg/dL 8.65  7.84  0.8   Sodium 134 - 144 mmol/L 141  141  142   Potassium 3.5 - 5.2 mmol/L 4.2  4.1  4.0   Chloride 96 - 106 mmol/L 103  102    CO2 20 - 29 mmol/L 32  32  29   Calcium 8.7 - 10.3 mg/dL 69.6  9.6  29.5   Total Protein 6.4 - 8.3 g/dL   7.1   Total Bilirubin 0.20 - 1.20 mg/dL   2.84   Alkaline Phos 40 - 150 U/L   127   AST 5 - 34 U/L   22   ALT 0 - 55 U/L   18     Lab Results  Component Value Date   WBC 6.5 03/01/2022   HGB 13.2 03/01/2022   HCT 38.8 03/01/2022   MCV 87 03/01/2022   PLT 184 03/01/2022   NEUTROABS 6.6 (H) 07/07/2015    ASSESSMENT & PLAN:  Breast cancer of lower-outer quadrant of right female breast (HCC) Rt Lumpectomy 07/16/15: IDC grade 2, 0.6 cm, with DCIS, 0/4 LN, ER 95%, PR 90% HER-2 negative ratio 1.70, KI 67:15%, Path stage: T1aN0 Stage 1A Oncotype DX score 19, 12% risk of recurrence Status post radiation 09/08/2015 to 10/26/2015   Current treatment: Anastrozole 1 mg daily 7 years started 11/01/2015 stopped March 2018, switched to letrozole 08/28/2016 because of continuing hot flashes I sent a prescription for exemestane (did not make a difference).   she went  back to letrozole.   Letrozole toxicities: 1. Hot flashes: Intermittently 2. myalgias arthralgias markedly better with letrozole.   Bone density done February 2019: T score -0.2 A.Fib on Eliquis Vaginal Dryness   Breast cancer surveillance: 1. Breast exam 12/11/2022: No abnormalities of concern 2. Mammograms at [facility] 09/27/2022: Benign, breast density category A     Return to clinic in 1 year for follow-up for long-term survivorship.    No orders of the defined types were placed in this encounter.  The patient has a good understanding of the overall plan. she agrees with it. she will call with any problems that may develop before the next visit here. Total time spent: 30  mins including face to face time and time spent for planning, charting and co-ordination of care   Tamsen Meek, MD 12/11/22    I Janan Ridge am acting as a Neurosurgeon for [employer]  I have reviewed the above documentation for accuracy and completeness, and I agree with the above.

## 2022-12-11 ENCOUNTER — Other Ambulatory Visit: Payer: Self-pay

## 2022-12-11 ENCOUNTER — Inpatient Hospital Stay: Payer: Medicare PPO | Attending: Hematology and Oncology | Admitting: Hematology and Oncology

## 2022-12-11 VITALS — BP 159/91 | HR 84 | Temp 97.9°F | Resp 18 | Ht 68.0 in | Wt 262.7 lb

## 2022-12-11 DIAGNOSIS — Z79811 Long term (current) use of aromatase inhibitors: Secondary | ICD-10-CM | POA: Insufficient documentation

## 2022-12-11 DIAGNOSIS — Z923 Personal history of irradiation: Secondary | ICD-10-CM | POA: Diagnosis not present

## 2022-12-11 DIAGNOSIS — Z170 Estrogen receptor positive status [ER+]: Secondary | ICD-10-CM | POA: Diagnosis not present

## 2022-12-11 DIAGNOSIS — C50511 Malignant neoplasm of lower-outer quadrant of right female breast: Secondary | ICD-10-CM | POA: Diagnosis not present

## 2022-12-11 NOTE — Assessment & Plan Note (Addendum)
Rt Lumpectomy 07/16/15: IDC grade 2, 0.6 cm, with DCIS, 0/4 LN, ER 95%, PR 90% HER-2 negative ratio 1.70, KI 67:15%, Path stage: T1aN0 Stage 1A Oncotype DX score 19, 12% risk of recurrence Status post radiation 09/08/2015 to 10/26/2015   Current treatment: Anastrozole 1 mg daily 7 years started 11/01/2015 stopped March 2018, switched to letrozole 08/28/2016 because of continuing hot flashes I sent a prescription for exemestane (did not make a difference).   she went  back to letrozole.   Letrozole toxicities: 1. Hot flashes: Intermittently 2. myalgias arthralgias markedly better with letrozole.   Bone density done February 2019: T score -0.2 A.Fib on Eliquis Vaginal Dryness   Breast cancer surveillance: 1. Breast exam 12/11/2022: No abnormalities of concern 2. Mammograms at [facility] 09/27/2022: Benign, breast density category A     Return to clinic in 1 year for follow-up.

## 2023-01-13 ENCOUNTER — Other Ambulatory Visit: Payer: Self-pay | Admitting: Hematology and Oncology

## 2023-01-22 DIAGNOSIS — E785 Hyperlipidemia, unspecified: Secondary | ICD-10-CM | POA: Diagnosis not present

## 2023-01-22 DIAGNOSIS — I4821 Permanent atrial fibrillation: Secondary | ICD-10-CM | POA: Diagnosis not present

## 2023-01-22 DIAGNOSIS — E559 Vitamin D deficiency, unspecified: Secondary | ICD-10-CM | POA: Diagnosis not present

## 2023-01-22 DIAGNOSIS — Z0000 Encounter for general adult medical examination without abnormal findings: Secondary | ICD-10-CM | POA: Diagnosis not present

## 2023-01-22 DIAGNOSIS — I2721 Secondary pulmonary arterial hypertension: Secondary | ICD-10-CM | POA: Diagnosis not present

## 2023-01-22 DIAGNOSIS — Z9181 History of falling: Secondary | ICD-10-CM | POA: Diagnosis not present

## 2023-01-22 DIAGNOSIS — Z79899 Other long term (current) drug therapy: Secondary | ICD-10-CM | POA: Diagnosis not present

## 2023-01-22 DIAGNOSIS — Z6841 Body Mass Index (BMI) 40.0 and over, adult: Secondary | ICD-10-CM | POA: Diagnosis not present

## 2023-01-23 LAB — LAB REPORT - SCANNED: EGFR: 87

## 2023-02-07 ENCOUNTER — Other Ambulatory Visit: Payer: Self-pay | Admitting: Cardiovascular Disease

## 2023-02-08 NOTE — Telephone Encounter (Signed)
Prescription refill request for Eliquis received. Indication: AF Last office visit: 02/21/22  Judie Petit Croitoru MD Scr: 0.73 on 01/22/23  Epic Age: 72 Weight: 115.5kg  Based on above findings Eliquis 5mg  twice daily is the appropriate dose.  Refill approved.

## 2023-02-14 DIAGNOSIS — M7061 Trochanteric bursitis, right hip: Secondary | ICD-10-CM | POA: Diagnosis not present

## 2023-03-06 DIAGNOSIS — L2989 Other pruritus: Secondary | ICD-10-CM | POA: Diagnosis not present

## 2023-03-06 DIAGNOSIS — L578 Other skin changes due to chronic exposure to nonionizing radiation: Secondary | ICD-10-CM | POA: Diagnosis not present

## 2023-03-06 DIAGNOSIS — L820 Inflamed seborrheic keratosis: Secondary | ICD-10-CM | POA: Diagnosis not present

## 2023-03-06 DIAGNOSIS — L570 Actinic keratosis: Secondary | ICD-10-CM | POA: Diagnosis not present

## 2023-03-06 DIAGNOSIS — Z09 Encounter for follow-up examination after completed treatment for conditions other than malignant neoplasm: Secondary | ICD-10-CM | POA: Diagnosis not present

## 2023-03-06 DIAGNOSIS — D485 Neoplasm of uncertain behavior of skin: Secondary | ICD-10-CM | POA: Diagnosis not present

## 2023-03-06 DIAGNOSIS — L538 Other specified erythematous conditions: Secondary | ICD-10-CM | POA: Diagnosis not present

## 2023-03-08 DIAGNOSIS — H52223 Regular astigmatism, bilateral: Secondary | ICD-10-CM | POA: Diagnosis not present

## 2023-03-08 DIAGNOSIS — H35371 Puckering of macula, right eye: Secondary | ICD-10-CM | POA: Diagnosis not present

## 2023-03-08 DIAGNOSIS — H524 Presbyopia: Secondary | ICD-10-CM | POA: Diagnosis not present

## 2023-03-08 DIAGNOSIS — H43393 Other vitreous opacities, bilateral: Secondary | ICD-10-CM | POA: Diagnosis not present

## 2023-03-08 DIAGNOSIS — H53143 Visual discomfort, bilateral: Secondary | ICD-10-CM | POA: Diagnosis not present

## 2023-03-08 DIAGNOSIS — H5203 Hypermetropia, bilateral: Secondary | ICD-10-CM | POA: Diagnosis not present

## 2023-03-09 DIAGNOSIS — I872 Venous insufficiency (chronic) (peripheral): Secondary | ICD-10-CM | POA: Diagnosis not present

## 2023-03-09 DIAGNOSIS — R600 Localized edema: Secondary | ICD-10-CM | POA: Diagnosis not present

## 2023-03-09 DIAGNOSIS — I83893 Varicose veins of bilateral lower extremities with other complications: Secondary | ICD-10-CM | POA: Diagnosis not present

## 2023-04-30 ENCOUNTER — Ambulatory Visit: Payer: Medicare PPO | Attending: Cardiovascular Disease | Admitting: Cardiovascular Disease

## 2023-04-30 ENCOUNTER — Encounter: Payer: Self-pay | Admitting: Cardiovascular Disease

## 2023-04-30 VITALS — BP 135/88 | HR 102 | Ht 68.0 in | Wt 261.0 lb

## 2023-04-30 DIAGNOSIS — G4733 Obstructive sleep apnea (adult) (pediatric): Secondary | ICD-10-CM

## 2023-04-30 DIAGNOSIS — I4811 Longstanding persistent atrial fibrillation: Secondary | ICD-10-CM

## 2023-04-30 DIAGNOSIS — E7800 Pure hypercholesterolemia, unspecified: Secondary | ICD-10-CM | POA: Diagnosis not present

## 2023-04-30 DIAGNOSIS — D6869 Other thrombophilia: Secondary | ICD-10-CM | POA: Diagnosis not present

## 2023-04-30 DIAGNOSIS — I2721 Secondary pulmonary arterial hypertension: Secondary | ICD-10-CM

## 2023-04-30 DIAGNOSIS — I2542 Coronary artery dissection: Secondary | ICD-10-CM | POA: Diagnosis not present

## 2023-04-30 MED ORDER — ROSUVASTATIN CALCIUM 10 MG PO TABS: 10.0000 mg | ORAL_TABLET | Freq: Every day | ORAL | 3 refills | Status: DC

## 2023-04-30 MED ORDER — APIXABAN 5 MG PO TABS: 5.0000 mg | ORAL_TABLET | Freq: Two times a day (BID) | ORAL | 3 refills | Status: DC

## 2023-04-30 NOTE — Progress Notes (Signed)
Cardiology Office Note    Date:  05/05/2023   ID:  Brandi Rodgers, DOB Aug 13, 1950, MRN [id number]  PCP:  Mila Palmer, MD  Cardiologist:   Thurmon Fair, MD   Chief Complaint  Patient presents with   Irregular Heart Beat      History of Present Illness:  Brandi Rodgers is a 72 y.o. female with longstanding persistent atrial fibrillation , migraine headaches for which she takes atenolol , on anticoagulation with Eliquis without bleeding complications, morbid obesity, sleep apnea (uses a jaw advancement device).  She was having complaints of chest discomfort  The patient specifically denies any chest pain at rest or with exertion, dyspnea at rest or with exertion, orthopnea, paroxysmal nocturnal dyspnea, syncope, palpitations, focal neurological deficits, intermittent claudication, lower extremity edema, unexplained weight gain, cough, hemoptysis or wheezing.  She denies daytime hypersomnolence.  Last year she was having complaints of chest discomfort and had a nuclear stress test described an apical perfusion defect consistent with infarction with peri-infarct ischemia and mildly decreased LVEF at 49%.  She subsequently underwent cardiac catheterization 10/19/2023Which confirmed an 80% distal stenosis in the LAD artery, with features suggesting spontaneous coronary artery dissection and recommended for medical management.  Her symptoms gradually subsided.  Her ECG no longer shows the inverted T waves in leads V4, V5, V6, I and aVL.  She remains in atrial fibrillation with controlled ventricular response.   Echocardiogram performed in October 2015 showed normal left ventricular wall thickness and ejection fraction, normal left atrial size, but did show mild pulmonary hypertension with an estimated systolic PA pressure 41 mmHg. Has a history of right breast cancer status post surgery and right breast radiation therapy. She does not have angina pectoris or any known coronary or  peripheral vascular obstructive lesions. She has mild hyperlipidemia which has been managed with diet alone. She has had problems with edema in the past, but uses furosemide extremely rarely.  She had abrupt onset chest pain in October 2023 and cardiac catheterization showed an 80% stenosis in the distal LAD artery with features suggestive of spontaneous coronary dissection.  Medical management was recommended.  Symptoms have resolved and have not recurred.  Past Medical History:  Diagnosis Date   Arthritis    cervical and lumbar spine   Breast cancer of lower-outer quadrant of right female breast (HCC) 07/02/2015   Degenerative lumbar disc    Dental crowns present    GERD (gastroesophageal reflux disease)    Hypercholesterolemia    borderline - no current med.   Jaw clicking    Migraines    Osteoarthritis    bilateral knee   Paroxysmal atrial fibrillation (HCC)    last episode was 01/2014   Radiation 09/07/15-10/26/15   right breast 50.4 Gy, boost to 10 Gy   Sleep apnea    uses mouth guard at night; no CPAP    Past Surgical History:  Procedure Laterality Date   CARPAL TUNNEL RELEASE Right 05/13/2003   DILATION AND CURETTAGE OF UTERUS     KNEE ARTHROSCOPY Right 04/06/2003   KNEE ARTHROSCOPY Left 11/30/2003   LEFT HEART CATH AND CORONARY ANGIOGRAPHY N/A 03/02/2022   Procedure: LEFT HEART CATH AND CORONARY ANGIOGRAPHY;  Surgeon: Kathleene Hazel, MD;  Location: [facility];  Service: Cardiovascular;  Laterality: N/A;   RADIOACTIVE SEED GUIDED PARTIAL MASTECTOMY WITH AXILLARY SENTINEL LYMPH NODE BIOPSY Right 07/16/2015   Procedure: RADIOACTIVE SEED GUIDED PARTIAL MASTECTOMY WITH AXILLARY SENTINEL LYMPH NODE BIOPSY;  Surgeon: Claud Kelp, MD;  Location: [facility];  Service: General;  Laterality: Right;   TOTAL KNEE ARTHROPLASTY Left 03/23/2014   Procedure: LEFT TOTAL KNEE ARTHROPLASTY ;  Surgeon: Loanne Drilling, MD;  Location: [facility];  Service: Orthopedics;   Laterality: Left;   TOTAL KNEE ARTHROPLASTY Right 08/21/2014   Procedure: RIGHT TOTAL KNEE ARTHROPLASTY;  Surgeon: Ollen Gross, MD;  Location: [facility];  Service: Orthopedics;  Laterality: Right;    Current Medications: Outpatient Medications Prior to Visit  Medication Sig Dispense Refill   acetaminophen (TYLENOL) 325 MG tablet Take 650 mg by mouth every 6 (six) hours as needed for moderate pain.     atenolol (TENORMIN) 50 MG tablet Take 50 mg by mouth daily.      bismuth subsalicylate (PEPTO BISMOL) 262 MG chewable tablet Chew 524 mg by mouth as needed for diarrhea or loose stools or indigestion.     calcium carbonate (TUMS - DOSED IN MG ELEMENTAL CALCIUM) 500 MG chewable tablet Chew 1 tablet by mouth daily as needed for indigestion or heartburn.     Calcium-Vitamin D-Vitamin K (VIACTIV PO) Take 1 tablet by mouth daily.     carboxymethylcellulose (REFRESH PLUS) 0.5 % SOLN Place 1 drop into both eyes 3 (three) times daily as needed (Dry Eyes).     gabapentin (NEURONTIN) 600 MG tablet Take 1 tablet (600 mg total) by mouth 2 (two) times daily.     Multiple Vitamin (MULTI-VITAMIN DAILY PO) Take 1 tablet by mouth daily.     rizatriptan (MAXALT-MLT) 10 MG disintegrating tablet Take 10 mg by mouth as needed for migraine. Reported on 09/14/2015     valACYclovir (VALTREX) 1000 MG tablet Take 1,000 mg by mouth 3 (three) times daily as needed (Fever blisters).     ELIQUIS 5 MG TABS tablet TAKE 1 TABLET BY MOUTH TWICE A DAY 180 tablet 1   letrozole (FEMARA) 2.5 MG tablet TAKE 1 TABLET BY MOUTH EVERY DAY (Patient not taking: Reported on 04/30/2023) 90 tablet 3   No facility-administered medications prior to visit.     Allergies:   Methocarbamol, Augmentin [amoxicillin-pot clavulanate], and Nsaids   Social History   Socioeconomic History   Marital status: Married    Spouse name: Not on file   Number of children: Not on file   Years of education: Not on file   Highest education level: Not on file   Occupational History   Not on file  Tobacco Use   Smoking status: Never   Smokeless tobacco: Never  Vaping Use   Vaping status: Never Used  Substance and Sexual Activity   Alcohol use: Yes    Comment: occasionally   Drug use: No   Sexual activity: Yes  Other Topics Concern   Not on file  Social History Narrative   Not on file   Social Drivers of Health   Financial Resource Strain: Not on file  Food Insecurity: Not on file  Transportation Needs: Not on file  Physical Activity: Not on file  Stress: Not on file  Social Connections: Not on file     Family History:  The patient's family history includes Lung cancer in her mother; Thyroid cancer in her mother.   ROS:   Please see the history of present illness.    ROS all other systems are reviewed and are negative   PHYSICAL EXAM:   VS:  BP 135/88   Pulse (!) 102   Ht 5\' 8"  (1.727 m)   Wt  261 lb (118.4 kg)   SpO2 95%   BMI 39.68 kg/m       General: Alert, oriented x3, no distress, severely obese Head: no evidence of trauma, PERRL, EOMI, no exophtalmos or lid lag, no myxedema, no xanthelasma; normal ears, nose and oropharynx Neck: normal jugular venous pulsations and no hepatojugular reflux; brisk carotid pulses without delay and no carotid bruits Chest: clear to auscultation, no signs of consolidation by percussion or palpation, normal fremitus, symmetrical and full respiratory excursions Cardiovascular: normal position and quality of the apical impulse, irregular rhythm, normal first and second heart sounds, no murmurs, rubs or gallops Abdomen: no tenderness or distention, no masses by palpation, no abnormal pulsatility or arterial bruits, normal bowel sounds, no hepatosplenomegaly Extremities: no clubbing, cyanosis or edema; 2+ radial, ulnar and brachial pulses bilaterally; 2+ right femoral, posterior tibial and dorsalis pedis pulses; 2+ left femoral, posterior tibial and dorsalis pedis pulses; no subclavian or  femoral bruits Neurological: grossly nonfocal Psych: Normal mood and affect     Wt Readings from Last 3 Encounters:  04/30/23 261 lb (118.4 kg)  12/11/22 262 lb 11.2 oz (119.2 kg)  03/02/22 250 lb (113.4 kg)      Studies/Labs Reviewed:   EKG:    EKG Interpretation Date/Time:  Monday April 30 2023 09:58:25 EST Ventricular Rate:  102 PR Interval:    QRS Duration:  78 QT Interval:  332 QTC Calculation: 432 R Axis:   -9  Text Interpretation: Atrial fibrillation with rapid ventricular response Nonspecific ST abnormality When compared with ECG of 02-Mar-2022 11:39, T wave inversion no longer evident in Inferior leads T wave inversion no longer evident in Anterolateral leads Confirmed by Yuliya Nova ([id number]) on 04/30/2023 10:01:05 AM        Recent Labs: No results found for requested labs within last 365 days.   01/25/2018 Hemoglobin 14.3, creatinine 0.7, potassium 4.2, normal liver function test, TSH 0.5 10/22/2019 Hemoglobin 14.6, creatinine 0.75, potassium 4.1, ALT 16, TSH 1.99 05/24/2007 2024  Hemoglobin 14.3, creatinine 0.71, potassium 3.8, ALT 15, TSH 2.45  Lipid Panel  No results found for: "CHOL", "TRIG", "HDL", "CHOLHDL", "VLDL", "LDLCALC", "LDLDIRECT"   01/25/2018 Total cholesterol 181, HDL 52, LDL 106, triglycerides 116 10/22/2019 Cholesterol 159, HDL 46, LDL 93, triglycerides 116 78/29/5621 Cholesterol 206, HDL 62, LDL 123, triglycerides 117  ASSESSMENT:    1. Coronary artery dissection   2. Longstanding persistent atrial fibrillation (HCC)   3. Acquired thrombophilia (HCC)   4. Severe obesity (BMI 35.0-39.9) with comorbidity (HCC)   5. OSA (obstructive sleep apnea)   6. PAH (pulmonary artery hypertension) (HCC)   7. Hypercholesterolemia       PLAN:  In order of problems listed above:  CAD/spontaneous coronary dissection: Her angina presented as right axillary discomfort at rest, with ECG changes in the lateral precordial leads, evidence of  apical infarct with peri-infarct ischemia on nuclear stress testing and an 80% distal LAD stenosis due to SCAD.  Afib: Longstanding persistent arrhythmia (probably permanent), asymptomatic.  Has been going on for at least 5 years.  Good rate control with beta-blocker therapy.  Continue anticoagulation. No plan for cardioversion or antiarrhythmics. Eliquis: CHA2DS2-VASc score of 2-3 (age and gender, CAD -not due to atherosclerosis).  Has not had any bleeding issues. Severe obesity: Fortunately weight has increased and is approaching morbid obesity range. OSA: Uses a dental device.  Denies daytime hypersomnolence.  She does not have lower extremity edema or other signs of right heart failure. PAH: This was  mild on a previous echo.  She denies shortness of breath.   HLP: Although her coronary problem was not due to atherosclerotic disease, I think it is important to prevent any additional coronary injury.  Recommend target LDL less than 70.  Start rosuvastatin 10 mg daily and recheck labs in 3 months. HTN: Blood pressure is borderline high.  Asked her to keep a log.  Beta-blockers are preferable with her history of coronary artery dissection.  They have also been successful in preventing her migraines.    Medication Adjustments/Labs and Tests Ordered: Current medicines are reviewed at length with the patient today.  Concerns regarding medicines are outlined above.  Medication changes, Labs and Tests ordered today are listed in the Patient Instructions below. Patient Instructions  Medication Instructions:  Rosuvastatin 10 mg daily *If you need a refill on your cardiac medications before your next appointment, please call your pharmacy*   Lab Work: Lipid panel- Please return for Blood Work in 3 months. No appointment needed, lab here at the office is open Monday-Friday from 8AM to 4PM and closed daily for lunch from 12:45-1:45.   If you have labs (blood work) drawn today and your tests are completely  normal, you will receive your results only by: MyChart Message (if you have MyChart) OR A paper copy in the mail If you have any lab test that is abnormal or we need to change your treatment, we will call you to review the results.    Follow-Up: At [facility], you and your health needs are our priority.  As part of our continuing mission to provide you with exceptional heart care, we have created designated Provider Care Teams.  These Care Teams include your primary Cardiologist (physician) and Advanced Practice Providers (APPs -  Physician Assistants and Nurse Practitioners) who all work together to provide you with the care you need, when you need it.  We recommend signing up for the patient portal called "MyChart".  Sign up information is provided on this After Visit Summary.  MyChart is used to connect with patients for Virtual Visits (Telemedicine).  Patients are able to view lab/test results, encounter notes, upcoming appointments, etc.  Non-urgent messages can be sent to your provider as well.   To learn more about what you can do with MyChart, go to [email or web address].    Your next appointment:   1 year(s)  Provider:   Thurmon Fair, MD       Signed, Thurmon Fair, MD  05/05/2023 5:11 PM    [facility] [address] [place], [place], [place]  [address] Phone: [phone]; Fax: [phone]

## 2023-04-30 NOTE — Patient Instructions (Signed)
Medication Instructions:  Rosuvastatin 10 mg daily *If you need a refill on your cardiac medications before your next appointment, please call your pharmacy*   Lab Work: Lipid panel- Please return for Blood Work in 3 months. No appointment needed, lab here at the office is open Monday-Friday from 8AM to 4PM and closed daily for lunch from 12:45-1:45.   If you have labs (blood work) drawn today and your tests are completely normal, you will receive your results only by: MyChart Message (if you have MyChart) OR A paper copy in the mail If you have any lab test that is abnormal or we need to change your treatment, we will call you to review the results.    Follow-Up: At [facility], you and your health needs are our priority.  As part of our continuing mission to provide you with exceptional heart care, we have created designated Provider Care Teams.  These Care Teams include your primary Cardiologist (physician) and Advanced Practice Providers (APPs -  Physician Assistants and Nurse Practitioners) who all work together to provide you with the care you need, when you need it.  We recommend signing up for the patient portal called "MyChart".  Sign up information is provided on this After Visit Summary.  MyChart is used to connect with patients for Virtual Visits (Telemedicine).  Patients are able to view lab/test results, encounter notes, upcoming appointments, etc.  Non-urgent messages can be sent to your provider as well.   To learn more about what you can do with MyChart, go to [email or web address].    Your next appointment:   1 year(s)  Provider:   Thurmon Fair, MD

## 2023-05-05 ENCOUNTER — Encounter: Payer: Self-pay | Admitting: Cardiovascular Disease

## 2023-05-18 DIAGNOSIS — L821 Other seborrheic keratosis: Secondary | ICD-10-CM | POA: Diagnosis not present

## 2023-05-18 DIAGNOSIS — Z09 Encounter for follow-up examination after completed treatment for conditions other than malignant neoplasm: Secondary | ICD-10-CM | POA: Diagnosis not present

## 2023-05-18 DIAGNOSIS — L814 Other melanin hyperpigmentation: Secondary | ICD-10-CM | POA: Diagnosis not present

## 2023-05-18 DIAGNOSIS — L570 Actinic keratosis: Secondary | ICD-10-CM | POA: Diagnosis not present

## 2023-05-18 DIAGNOSIS — D225 Melanocytic nevi of trunk: Secondary | ICD-10-CM | POA: Diagnosis not present

## 2023-05-23 ENCOUNTER — Telehealth: Payer: Self-pay | Admitting: Cardiovascular Disease

## 2023-05-23 DIAGNOSIS — M25551 Pain in right hip: Secondary | ICD-10-CM | POA: Diagnosis not present

## 2023-05-23 NOTE — Telephone Encounter (Signed)
 Paper Work Dropped Off: Blood Pressure Readings  Date: 05/23/2023  Location of paper:  Dr. Royann Shivers Mailbox

## 2023-05-28 ENCOUNTER — Encounter: Payer: Self-pay | Admitting: Cardiovascular Disease

## 2023-05-29 MED ORDER — CARVEDILOL 12.5 MG PO TABS: 12.5000 mg | ORAL_TABLET | Freq: Two times a day (BID) | ORAL | 3 refills | Status: DC

## 2023-05-30 NOTE — Telephone Encounter (Signed)
 Patient identification verified by 2 forms. Hilton Lucky, RN    Called and spoke to patient  Informed patient:   -does not need to wean off atenolol    -can start taking carvedilol  tomorrow morning since picked up today   -take Rx twice a day (12 hours apart)   -plan to check bo 1 hour after taking medication   -outreach with concern of high/low BP  Reviewed ED warning signs/precautions  Patient verbalized understanding, no questions at this time

## 2023-06-04 NOTE — Telephone Encounter (Signed)
Please increase the carvedilol to 25 mg twice daily and let's review her BP trend again at the end of the week.

## 2023-06-05 MED ORDER — CARVEDILOL 25 MG PO TABS: 25.0000 mg | ORAL_TABLET | Freq: Two times a day (BID) | ORAL | 3 refills | Status: DC

## 2023-06-05 NOTE — Addendum Note (Signed)
Addended by: Freddi Starr on: 06/05/2023 08:04 AM   Modules accepted: Orders

## 2023-06-12 ENCOUNTER — Telehealth: Payer: Self-pay | Admitting: Cardiovascular Disease

## 2023-06-12 NOTE — Telephone Encounter (Signed)
Please stop the carvedilol and start atenolol 50 mg daily.  Can be taken all at once, does not have to be in divided doses.  Please send [place] another blood pressure log in about a week.

## 2023-06-12 NOTE — Telephone Encounter (Signed)
Pt c/o BP issue: STAT if pt c/o blurred vision, one-sided weakness or slurred speech  1. What are your last 5 BP readings?   138/89  HR 70 (today) 129/78 128/92 156/95 154/89   2. Are you having any other symptoms (ex. Dizziness, headache, blurred vision, passed out)?   Headache, dizziness every once in a while  3. What is your BP issue?   Patient is concerned her BP medication is not working and wants a call back to discuss next steps.

## 2023-06-12 NOTE — Telephone Encounter (Signed)
Called and spoke to patient. Verified name and DOB. Patient report that she continue to have headaches even with the increase in Carvedilol to 25 mg BID. She also is having some chest tightness and feels her heart pounding more that comes and goes. She has felt like she is going to pass at times out with strenuous activity. Patient stated that she was prescribed Atenolol years ago to help with migraines. Since she's been off the Atenolol she's had a headache daily and most days when she take Tylenol it barely goes away.  Please advise.

## 2023-06-12 NOTE — Telephone Encounter (Signed)
Called and spoke to patient. Below message relayed.    Thurmon Fair, MD  Physician Cardiology    Creation Time: 06/12/2023  4:12 PM  Signed   Stop carvedilol and start atenolol 50 mg once daily.  Send a BP and heart rate log in a week please.

## 2023-06-12 NOTE — Telephone Encounter (Signed)
Stop carvedilol and start atenolol 50 mg once daily.  Send a BP and heart rate log in a week please.

## 2023-06-13 MED ORDER — ATENOLOL 100 MG PO TABS: 100.0000 mg | ORAL_TABLET | Freq: Every evening | ORAL | 3 refills | Status: AC

## 2023-06-13 NOTE — Telephone Encounter (Signed)
Attempted to call patient, no answer left message requesting a call back.

## 2023-06-13 NOTE — Telephone Encounter (Signed)
Spoke to patient after transfer from the call center. Advised patient per Dr Royann Shivers message below. New prescription sent to [facility] for Atenolol 10 mg nightly and Carvedilol discontinued. Patient verbalized understanding and agree.    Croitoru, Rachelle Hora, MD to Herminio Heads "Cathy"      06/12/23  5:26 PM Sorry, I thought you were taking atenolol 25 mg daily, my mistake. Please stop the carvedilol now. And start atenolol 100 mg daily tonight instead, then take it 100 mg every evening. Please continue to log both BP and HR Dr. Salena Saner

## 2023-06-13 NOTE — Addendum Note (Signed)
Addended by: Kurtis Bushman on: 06/13/2023 10:24 AM   Modules accepted: Orders

## 2023-08-01 ENCOUNTER — Encounter: Payer: Self-pay | Admitting: Cardiovascular Disease

## 2023-08-06 DIAGNOSIS — E7800 Pure hypercholesterolemia, unspecified: Secondary | ICD-10-CM | POA: Diagnosis not present

## 2023-08-06 LAB — LIPID PANEL
Chol/HDL Ratio: 2.4 ratio (ref 0.0–4.4)
Cholesterol, Total: 128 mg/dL (ref 100–199)
HDL: 53 mg/dL (ref >39)
LDL Chol Calc (NIH): 57 mg/dL (ref 0–99)
Triglycerides: 98 mg/dL (ref 0–149)
VLDL Cholesterol Cal: 18 mg/dL (ref 5–40)

## 2023-08-07 ENCOUNTER — Encounter: Payer: Self-pay | Admitting: Cardiovascular Disease

## 2023-08-09 ENCOUNTER — Encounter: Payer: Self-pay | Admitting: Cardiovascular Disease

## 2023-08-28 DIAGNOSIS — R2232 Localized swelling, mass and lump, left upper limb: Secondary | ICD-10-CM | POA: Diagnosis not present

## 2023-08-28 DIAGNOSIS — M25511 Pain in right shoulder: Secondary | ICD-10-CM | POA: Diagnosis not present

## 2023-08-29 NOTE — Progress Notes (Signed)
 [facility] - Clinic Note  09/03/2023     CHIEF COMPLAINT Patient presents for Retina Evaluation   HISTORY OF PRESENT ILLNESS: Brandi Rodgers is a 73 y.o. female who presents to the clinic today for:   HPI     Retina Evaluation   In right eye.  This started 4 years ago.  Duration of 1 year.  Associated Symptoms Floaters, Distortion, Redness, Glare, Scalp Tenderness and Shoulder/Hip pain.  Negative for Flashes, Blind Spot, Pain, Photophobia, Trauma, Jaw Claudication, Fever, Weight Loss and Fatigue.  Context:  distance vision, mid-range vision, near vision, night driving and dim lighting.  Treatments tried include eye drops.  I, the attending physician,  performed the HPI with the patient and updated documentation appropriately.        Comments   Pt presents for 1 year retina evaluation. Pt had ERM OD 2021. Pt states she is using refresh 3-4 times per day. Pt has noticed an ant like floater in the right eye, she is not sure if it is new. Pt only noticed distorted vision in the od if the os is covered up. Pt states a couple of months ago there was a dark shadow in  the lower part of the right eye but it only lasted 2 hours and went away.       Last edited by Ronelle Coffee, MD on 09/03/2023 12:44 PM.    Patient states she is seeing a shadow once that lasted 30-40 minutes.   Referring physician: Olin Bertin, MD [address] [place],  [place] [address]  HISTORICAL INFORMATION:   Selected notes from the MEDICAL RECORD NUMBER Referred by Dr. Artemisa Lars for concern of ERM OD   CURRENT MEDICATIONS: Current Outpatient Medications (Ophthalmic Drugs)  Medication Sig   carboxymethylcellulose (REFRESH PLUS) 0.5 % SOLN Place 1 drop into both eyes 3 (three) times daily as needed (Dry Eyes).   No current facility-administered medications for this visit. (Ophthalmic Drugs)   Current Outpatient Medications (Other)  Medication Sig   acetaminophen   (TYLENOL ) 325 MG tablet Take 650 mg by mouth every 6 (six) hours as needed for moderate pain.   apixaban  (ELIQUIS ) 5 MG TABS tablet Take 1 tablet (5 mg total) by mouth 2 (two) times daily.   atenolol  (TENORMIN ) 100 MG tablet Take 1 tablet (100 mg total) by mouth at bedtime. (Patient taking differently: Take 100 mg by mouth 2 (two) times daily.)   bismuth subsalicylate (PEPTO BISMOL) 262 MG chewable tablet Chew 524 mg by mouth as needed for diarrhea or loose stools or indigestion.   calcium  carbonate (TUMS - DOSED IN MG ELEMENTAL CALCIUM ) 500 MG chewable tablet Chew 1 tablet by mouth daily as needed for indigestion or heartburn.   Calcium -Vitamin D-Vitamin K (VIACTIV PO) Take 1 tablet by mouth daily.   gabapentin  (NEURONTIN ) 600 MG tablet Take 1 tablet (600 mg total) by mouth 2 (two) times daily.   Multiple Vitamin (MULTI-VITAMIN DAILY PO) Take 1 tablet by mouth daily.   rizatriptan  (MAXALT -MLT) 10 MG disintegrating tablet Take 10 mg by mouth as needed for migraine. Reported on 09/14/2015   rosuvastatin  (CRESTOR ) 10 MG tablet Take 1 tablet (10 mg total) by mouth daily.   valACYclovir (VALTREX) 1000 MG tablet Take 1,000 mg by mouth 3 (three) times daily as needed (Fever blisters).   No current facility-administered medications for this visit. (Other)   REVIEW OF SYSTEMS: ROS   Positive for: Musculoskeletal, Eyes Negative for: Constitutional, Gastrointestinal, Neurological,  Skin, Genitourinary, HENT, Endocrine, Cardiovascular, Respiratory, Psychiatric, Allergic/Imm, Heme/Lymph Last edited by Carrington Clack, COT on 09/03/2023  9:03 AM.     ALLERGIES Allergies  Allergen Reactions   Methocarbamol  Hives   Augmentin [Amoxicillin-Pot Clavulanate] Diarrhea   Nsaids     On Blood Thinner   PAST MEDICAL HISTORY Past Medical History:  Diagnosis Date   Arthritis    cervical and lumbar spine   Breast cancer of lower-outer quadrant of right female breast (HCC) 07/02/2015   Degenerative lumbar disc     Dental crowns present    GERD (gastroesophageal reflux disease)    Hypercholesterolemia    borderline - no current med.   Jaw clicking    Migraines    Osteoarthritis    bilateral knee   Paroxysmal atrial fibrillation (HCC)    last episode was 01/2014   Radiation 09/07/15-10/26/15   right breast 50.4 Gy, boost to 10 Gy   Sleep apnea    uses mouth guard at night; no CPAP   Past Surgical History:  Procedure Laterality Date   CARPAL TUNNEL RELEASE Right 05/13/2003   CATARACT EXTRACTION Bilateral 2022   Stonseipher   DILATION AND CURETTAGE OF UTERUS     KNEE ARTHROSCOPY Right 04/06/2003   KNEE ARTHROSCOPY Left 11/30/2003   LEFT HEART CATH AND CORONARY ANGIOGRAPHY N/A 03/02/2022   Procedure: LEFT HEART CATH AND CORONARY ANGIOGRAPHY;  Surgeon: Odie Benne, MD;  [facility];  Service: Cardiovascular;  Laterality: N/A;   RADIOACTIVE SEED GUIDED PARTIAL MASTECTOMY WITH AXILLARY SENTINEL LYMPH NODE BIOPSY Right 07/16/2015   Procedure: RADIOACTIVE SEED GUIDED PARTIAL MASTECTOMY WITH AXILLARY SENTINEL LYMPH NODE BIOPSY;  Surgeon: Boyce Byes, MD;  Location: [facility];  Service: General;  Laterality: Right;   TOTAL KNEE ARTHROPLASTY Left 03/23/2014   Procedure: LEFT TOTAL KNEE ARTHROPLASTY ;  Surgeon: Aurther Blue, MD;  Location: [facility];  Service: Orthopedics;  Laterality: Left;   TOTAL KNEE ARTHROPLASTY Right 08/21/2014   Procedure: RIGHT TOTAL KNEE ARTHROPLASTY;  Surgeon: Liliane Rei, MD;  Location: [facility];  Service: Orthopedics;  Laterality: Right;   FAMILY HISTORY Family History  Problem Relation Age of Onset   Lung cancer Mother    Thyroid cancer Mother    SOCIAL HISTORY Social History   Tobacco Use   Smoking status: Never   Smokeless tobacco: Never  Vaping Use   Vaping status: Never Used  Substance Use Topics   Alcohol use: Yes    Comment: occasionally   Drug use: No       OPHTHALMIC EXAM:  Base Eye Exam     Visual  Acuity (Snellen - Linear)       Right Left   Dist [place] 20/30 20/20   Dist ph [place] NI          Tonometry (Tonopen, 9:11 AM)       Right Left   Pressure 12 14         Pupils       Dark Light Shape React APD   Right 4 2 Round Brisk None   Left 4 2 Round Brisk None         Visual Fields       Left Right    Full Full         Extraocular Movement       Right Left    Full, Ortho Full, Ortho         Neuro/Psych     Oriented  x3: Yes   Mood/Affect: Normal         Dilation     Both eyes: 1.0% Mydriacyl, 2.5% Phenylephrine @ 9:11 AM           Slit Lamp and Fundus Exam     Slit Lamp Exam       Right Left   Lids/Lashes Dermatochalasis - upper lid Dermatochalasis - upper lid   Conjunctiva/Sclera White and quiet mild temporal Pinguecula   Cornea mild Arcus, well healed cataract wound, mild tear film debris, trace Punctate epithelial erosions Trace Punctate epithelial erosions inferiorly, tear film debris, mild Arcus, well healed cataract wound, focal corneal haze IT midzone   Anterior Chamber Deep and quiet Deep and quiet   Iris Round and dilated Round and dilated   Lens PC IOL in perfect position (vivity), open PC PC IOL in excellent postion (viviity), open PC   Anterior Vitreous Vitreous syneresis, Posterior vitreous detachment, mild Asteroid hyalosis inferiorly Vitreous syneresis, Posterior vitreous detachment, Vitreous condensations         Fundus Exam       Right Left   Disc Trace pallor, sharp rim, mild temporal PPA Compact, mild pallor, Sharp, mild temporal PPA   C/D Ratio 0.5 0.6   Macula Flat, Blunted foveal reflex, Epiretinal membrane with mild striae -- stable, no heme Flat, Blunted foveal reflex, trace ERM SN mac, mild RPE mottling, No heme or edema   Vessels mild tortuosity mild tortuosity   Periphery Attached, reticular degeneration, paving stone degeneration inferior and temporal periphery, No RT/RD, No heme Attached, reticular  degeneration, paving stone degeneration inferior and temporal periphery, No RT/RD, No heme           IMAGING AND PROCEDURES  Imaging and Procedures for @TODAY @  OCT, Retina - OU - Both Eyes       Right Eye Quality was good. Central Foveal Thickness: 378. Progression has been stable. Findings include no IRF, no SRF, abnormal foveal contour, epiretinal membrane, macular pucker (Stable ERM with sharpening of fovea contour and mild pucker).   Left Eye Quality was good. Central Foveal Thickness: 277. Progression has been stable. Findings include normal foveal contour, no IRF, no SRF (Trace ERM superiorly--stable).   Notes *Images captured and stored on drive  Diagnosis / Impression:  OD: stable ERM with sharpening of fovea contour and mild pucker OS: NFP, no IRF/SRF--stable  Clinical management:  See below  Abbreviations: NFP - Normal foveal profile. CME - cystoid macular edema. PED - pigment epithelial detachment. IRF - intraretinal fluid. SRF - subretinal fluid. EZ - ellipsoid zone. ERM - epiretinal membrane. ORA - outer retinal atrophy. ORT - outer retinal tubulation. SRHM - subretinal hyper-reflective material            ASSESSMENT/PLAN:    ICD-10-CM   1. Epiretinal membrane (ERM) of right eye  H35.371 OCT, Retina - OU - Both Eyes    2. Essential hypertension  I10     3. Hypertensive retinopathy of both eyes  H35.033     4. Pseudophakia of both eyes  Z96.1      1. Epiretinal membrane, OD  - mild ERM -- no significant change from prior  - BCVA 20/30 OD -- stable  - asymptomatic, no metamorphopsia  - no indication for surgery at this time  - monitor for now - had CE with IOL OU w/ Stonecipher (OD: 05.18.21, OS: 05.25.21), vision improved and pt very happy  - f/u 1 year, sooner prn -- DFE/OCT  2,3. Hypertensive retinopathy OU  - discussed importance of tight BP control  - monitor   4. Pseudophakia OU  - s/p CE/IOL OU (OD: 05.18.21, OS: 05.25.21, Vivity OU,  Stonecipher)  - s/p YAG cap OU  - beautiful surgeries, doing well  - monitor   Ophthalmic Meds Ordered this visit:  No orders of the defined types were placed in this encounter.    Return in about 1 year (around 09/02/2024) for f/u ERM OD , DFE, OCT.  There are no Patient Instructions on file for this visit.  This document serves as a record of services personally performed by Jeanice Millard, MD, PhD. It was created on their behalf by Olene Berne, COT an ophthalmic technician. The creation of this record is the provider's dictation and/or activities during the visit.    Electronically signed by:  Olene Berne, COT  09/03/23 12:46 PM  Jeanice Millard, M.D., Ph.D. Diseases & Surgery of the Retina and Vitreous [facility]  I have reviewed the above documentation for accuracy and completeness, and I agree with the above. Jeanice Millard, M.D., Ph.D. 09/03/23 12:47 PM   Abbreviations: M myopia (nearsighted); A astigmatism; H hyperopia (farsighted); P presbyopia; Mrx spectacle prescription;  CTL contact lenses; OD right eye; OS left eye; OU both eyes  XT exotropia; ET esotropia; PEK punctate epithelial keratitis; PEE punctate epithelial erosions; DES dry eye syndrome; MGD meibomian gland dysfunction; ATs artificial tears; PFAT's preservative free artificial tears; NSC nuclear sclerotic cataract; PSC posterior subcapsular cataract; ERM epi-retinal membrane; PVD posterior vitreous detachment; RD retinal detachment; DM diabetes mellitus; DR diabetic retinopathy; NPDR non-proliferative diabetic retinopathy; PDR proliferative diabetic retinopathy; CSME clinically significant macular edema; DME diabetic macular edema; dbh dot blot hemorrhages; CWS cotton wool spot; POAG primary open angle glaucoma; C/D cup-to-disc ratio; HVF humphrey visual field; GVF goldmann visual field; OCT optical coherence tomography; IOP intraocular pressure; BRVO Branch retinal vein  occlusion; CRVO central retinal vein occlusion; CRAO central retinal artery occlusion; BRAO branch retinal artery occlusion; RT retinal tear; SB scleral buckle; PPV pars plana vitrectomy; VH Vitreous hemorrhage; PRP panretinal laser photocoagulation; IVK intravitreal kenalog; VMT vitreomacular traction; MH Macular hole;  NVD neovascularization of the disc; NVE neovascularization elsewhere; AREDS age related eye disease study; ARMD age related macular degeneration; POAG primary open angle glaucoma; EBMD epithelial/anterior basement membrane dystrophy; ACIOL anterior chamber intraocular lens; IOL intraocular lens; PCIOL posterior chamber intraocular lens; Phaco/IOL phacoemulsification with intraocular lens placement; PRK photorefractive keratectomy; LASIK laser assisted in situ keratomileusis; HTN hypertension; DM diabetes mellitus; COPD chronic obstructive pulmonary disease

## 2023-09-03 ENCOUNTER — Encounter (INDEPENDENT_AMBULATORY_CARE_PROVIDER_SITE_OTHER): Payer: Self-pay | Admitting: Ophthalmology

## 2023-09-03 ENCOUNTER — Ambulatory Visit (INDEPENDENT_AMBULATORY_CARE_PROVIDER_SITE_OTHER): Payer: Medicare PPO | Admitting: Ophthalmology

## 2023-09-03 DIAGNOSIS — H35371 Puckering of macula, right eye: Secondary | ICD-10-CM

## 2023-09-03 DIAGNOSIS — H35033 Hypertensive retinopathy, bilateral: Secondary | ICD-10-CM

## 2023-09-03 DIAGNOSIS — I10 Essential (primary) hypertension: Secondary | ICD-10-CM

## 2023-09-03 DIAGNOSIS — Z961 Presence of intraocular lens: Secondary | ICD-10-CM | POA: Diagnosis not present

## 2023-09-11 DIAGNOSIS — Z09 Encounter for follow-up examination after completed treatment for conditions other than malignant neoplasm: Secondary | ICD-10-CM | POA: Diagnosis not present

## 2023-09-11 DIAGNOSIS — L570 Actinic keratosis: Secondary | ICD-10-CM | POA: Diagnosis not present

## 2023-09-11 DIAGNOSIS — L814 Other melanin hyperpigmentation: Secondary | ICD-10-CM | POA: Diagnosis not present

## 2023-09-11 DIAGNOSIS — L821 Other seborrheic keratosis: Secondary | ICD-10-CM | POA: Diagnosis not present

## 2023-10-03 DIAGNOSIS — Z1231 Encounter for screening mammogram for malignant neoplasm of breast: Secondary | ICD-10-CM | POA: Diagnosis not present

## 2023-10-04 DIAGNOSIS — M65342 Trigger finger, left ring finger: Secondary | ICD-10-CM | POA: Diagnosis not present

## 2023-10-04 DIAGNOSIS — M720 Palmar fascial fibromatosis [Dupuytren]: Secondary | ICD-10-CM | POA: Diagnosis not present

## 2023-10-04 DIAGNOSIS — M79645 Pain in left finger(s): Secondary | ICD-10-CM | POA: Insufficient documentation

## 2023-10-13 DIAGNOSIS — I2510 Atherosclerotic heart disease of native coronary artery without angina pectoris: Secondary | ICD-10-CM | POA: Diagnosis not present

## 2023-10-13 DIAGNOSIS — I10 Essential (primary) hypertension: Secondary | ICD-10-CM | POA: Diagnosis not present

## 2023-10-13 DIAGNOSIS — I480 Paroxysmal atrial fibrillation: Secondary | ICD-10-CM | POA: Diagnosis not present

## 2023-10-18 ENCOUNTER — Encounter: Payer: Self-pay | Admitting: Hematology and Oncology

## 2023-11-14 DIAGNOSIS — S92531A Displaced fracture of distal phalanx of right lesser toe(s), initial encounter for closed fracture: Secondary | ICD-10-CM | POA: Diagnosis not present

## 2023-11-14 DIAGNOSIS — Z96652 Presence of left artificial knee joint: Secondary | ICD-10-CM | POA: Insufficient documentation

## 2023-11-14 DIAGNOSIS — S92533A Displaced fracture of distal phalanx of unspecified lesser toe(s), initial encounter for closed fracture: Secondary | ICD-10-CM | POA: Insufficient documentation

## 2023-11-20 DIAGNOSIS — M7061 Trochanteric bursitis, right hip: Secondary | ICD-10-CM | POA: Diagnosis not present

## 2023-11-28 DIAGNOSIS — Z23 Encounter for immunization: Secondary | ICD-10-CM | POA: Diagnosis not present

## 2023-11-28 DIAGNOSIS — Z7184 Encounter for health counseling related to travel: Secondary | ICD-10-CM | POA: Diagnosis not present

## 2023-11-28 DIAGNOSIS — R61 Generalized hyperhidrosis: Secondary | ICD-10-CM | POA: Diagnosis not present

## 2023-11-29 DIAGNOSIS — Z83719 Family history of colon polyps, unspecified: Secondary | ICD-10-CM | POA: Diagnosis not present

## 2023-11-29 DIAGNOSIS — K921 Melena: Secondary | ICD-10-CM | POA: Diagnosis not present

## 2023-11-29 DIAGNOSIS — Z86018 Personal history of other benign neoplasm: Secondary | ICD-10-CM | POA: Diagnosis not present

## 2023-12-04 DIAGNOSIS — M7061 Trochanteric bursitis, right hip: Secondary | ICD-10-CM | POA: Insufficient documentation

## 2023-12-13 ENCOUNTER — Encounter: Payer: Self-pay | Admitting: Adult Health

## 2023-12-13 ENCOUNTER — Inpatient Hospital Stay: Payer: Medicare PPO | Attending: Adult Health | Admitting: Adult Health

## 2023-12-13 VITALS — BP 143/70 | HR 66 | Temp 98.0°F | Resp 18 | Ht 68.0 in | Wt 265.6 lb

## 2023-12-13 DIAGNOSIS — E7800 Pure hypercholesterolemia, unspecified: Secondary | ICD-10-CM | POA: Insufficient documentation

## 2023-12-13 DIAGNOSIS — Z1721 Progesterone receptor positive status: Secondary | ICD-10-CM | POA: Insufficient documentation

## 2023-12-13 DIAGNOSIS — Z79811 Long term (current) use of aromatase inhibitors: Secondary | ICD-10-CM | POA: Diagnosis not present

## 2023-12-13 DIAGNOSIS — I2510 Atherosclerotic heart disease of native coronary artery without angina pectoris: Secondary | ICD-10-CM | POA: Diagnosis not present

## 2023-12-13 DIAGNOSIS — Z170 Estrogen receptor positive status [ER+]: Secondary | ICD-10-CM | POA: Diagnosis not present

## 2023-12-13 DIAGNOSIS — Z7901 Long term (current) use of anticoagulants: Secondary | ICD-10-CM | POA: Insufficient documentation

## 2023-12-13 DIAGNOSIS — C50511 Malignant neoplasm of lower-outer quadrant of right female breast: Secondary | ICD-10-CM | POA: Diagnosis not present

## 2023-12-13 DIAGNOSIS — I480 Paroxysmal atrial fibrillation: Secondary | ICD-10-CM | POA: Diagnosis not present

## 2023-12-13 DIAGNOSIS — M62838 Other muscle spasm: Secondary | ICD-10-CM | POA: Insufficient documentation

## 2023-12-13 DIAGNOSIS — Z801 Family history of malignant neoplasm of trachea, bronchus and lung: Secondary | ICD-10-CM | POA: Diagnosis not present

## 2023-12-13 DIAGNOSIS — Z1732 Human epidermal growth factor receptor 2 negative status: Secondary | ICD-10-CM | POA: Diagnosis not present

## 2023-12-13 DIAGNOSIS — I10 Essential (primary) hypertension: Secondary | ICD-10-CM | POA: Diagnosis not present

## 2023-12-13 NOTE — Progress Notes (Signed)
 [facility] Cancer Follow up:    [name], MD [address] [place] [address] [place] [address]   DIAGNOSIS: Cancer Staging  Breast cancer of lower-outer quadrant of right female breast [facility]) Staging form: Breast, AJCC 7th Edition - Clinical stage from 07/07/2015: Stage IA (T1a, N0, M0) - Unsigned Staged by: Pathologist and managing physician Laterality: Right Estrogen receptor status: Positive Progesterone receptor status: Positive HER2 status: Negative Stage used in treatment planning: Yes National guidelines used in treatment planning: Yes Type of national guideline used in treatment planning: NCCN    SUMMARY OF ONCOLOGIC HISTORY: Oncology History  Breast cancer of lower-outer quadrant of right female breast (HCC)  06/25/2015 Initial Diagnosis   Right breast screening det focal asymmetry plus calcs 4 mm at 8:00 axilla neg, 3-D biopsy: Grade 1 IDC with DCIS and calcifications, ER 95%, PR 90% HER-2 negative ratio 1.70, KI 67:15%, clinical stage: T1aN0 Stage 1A    07/16/2015 Surgery   Rt Lumpectomy Warden): IDC grade 2, 0.6 cm, with DCIS, 0/4 LN, ER 95%, PR 90% HER-2 negative ratio 1.70, KI 67:15%, Path stage: T1aN0 Stage 1A , Oncotype DX score 19, 12% ROR   09/07/2015 - 10/26/2015 Radiation Therapy   Adjuvant radiation therapy ([facility]). Right breast treated to 50.4 Gy in 28 fractions.  Right breast boost treated to 10 Gy in 5 fractions    11/01/2015 -  Anti-estrogen oral therapy   Anastrozole  1 mg daily stopped March 2018 due to severe hot flashes switched to letrozole  08/28/2016 switched to exemestane  11/27/2017     CURRENT THERAPY:  INTERVAL HISTORY:  Discussed the use of AI scribe software for clinical note transcription with the patient, who gave verbal consent to proceed.  Brandi Rodgers 73 y.o. female returns for    Patient Active Problem List   Diagnosis Date Noted   Chest pain of uncertain etiology    Abnormal stress test    Other  persistent atrial fibrillation (HCC) 07/18/2018   Long term (current) use of anticoagulants 07/18/2018   Moderate obesity 12/27/2015   PAH (pulmonary artery hypertension) (HCC) 12/27/2015   Breast cancer of lower-outer quadrant of right female breast (HCC) 07/02/2015   OA (osteoarthritis) of knee 03/23/2014   Paroxysmal atrial fibrillation (HCC) 03/06/2014   Essential hypertension 03/06/2014   Hypercholesterolemia 03/06/2014   Osteoarthritis 03/06/2014   Migraine headache 03/06/2014    is allergic to methocarbamol , augmentin [amoxicillin-pot clavulanate], and nsaids.  MEDICAL HISTORY: Past Medical History:  Diagnosis Date   Arthritis    cervical and lumbar spine   Breast cancer of lower-outer quadrant of right female breast (HCC) 07/02/2015   Degenerative lumbar disc    Dental crowns present    GERD (gastroesophageal reflux disease)    Hypercholesterolemia    borderline - no current med.   Jaw clicking    Migraines    Osteoarthritis    bilateral knee   Paroxysmal atrial fibrillation (HCC)    last episode was 01/2014   Radiation 09/07/15-10/26/15   right breast 50.4 Gy, boost to 10 Gy   Sleep apnea    uses mouth guard at night; no CPAP    SURGICAL HISTORY: Past Surgical History:  Procedure Laterality Date   CARPAL TUNNEL RELEASE Right 05/13/2003   CATARACT EXTRACTION Bilateral 2022   Stonseipher   DILATION AND CURETTAGE OF UTERUS     KNEE ARTHROSCOPY Right 04/06/2003   KNEE ARTHROSCOPY Left 11/30/2003   LEFT HEART CATH AND CORONARY ANGIOGRAPHY N/A 03/02/2022   Procedure:  LEFT HEART CATH AND CORONARY ANGIOGRAPHY;  Surgeon: Verlin Lonni BIRCH, MD;  Location: [facility];  Service: Cardiovascular;  Laterality: N/A;   RADIOACTIVE SEED GUIDED PARTIAL MASTECTOMY WITH AXILLARY SENTINEL LYMPH NODE BIOPSY Right 07/16/2015   Procedure: RADIOACTIVE SEED GUIDED PARTIAL MASTECTOMY WITH AXILLARY SENTINEL LYMPH NODE BIOPSY;  Surgeon: Elon Pacini, MD;  Location: [facility];  Service: General;  Laterality: Right;   TOTAL KNEE ARTHROPLASTY Left 03/23/2014   Procedure: LEFT TOTAL KNEE ARTHROPLASTY ;  Surgeon: Dempsey Melodi GAILS, MD;  Location: [facility];  Service: Orthopedics;  Laterality: Left;   TOTAL KNEE ARTHROPLASTY Right 08/21/2014   Procedure: RIGHT TOTAL KNEE ARTHROPLASTY;  Surgeon: Dempsey Melodi, MD;  Location: [facility];  Service: Orthopedics;  Laterality: Right;    SOCIAL HISTORY: Social History   Socioeconomic History   Marital status: Married    Spouse name: Not on file   Number of children: Not on file   Years of education: Not on file   Highest education level: Not on file  Occupational History   Not on file  Tobacco Use   Smoking status: Never   Smokeless tobacco: Never  Vaping Use   Vaping status: Never Used  Substance and Sexual Activity   Alcohol use: Yes    Comment: occasionally   Drug use: No   Sexual activity: Yes  Other Topics Concern   Not on file  Social History Narrative   Not on file   Social Drivers of Health   Financial Resource Strain: Not on file  Food Insecurity: Not on file  Transportation Needs: Not on file  Physical Activity: Not on file  Stress: Not on file  Social Connections: Not on file  Intimate Partner Violence: Not on file    FAMILY HISTORY: Family History  Problem Relation Age of Onset   Lung cancer Mother    Thyroid cancer Mother     Review of Systems - Oncology    PHYSICAL EXAMINATION    There were no vitals filed for this visit.  Physical Exam Constitutional:      General: She is not in acute distress.    Appearance: Normal appearance. She is not toxic-appearing.  HENT:     Head: Normocephalic and atraumatic.     Mouth/Throat:     Mouth: Mucous membranes are moist.     Pharynx: Oropharynx is clear. No oropharyngeal exudate or posterior oropharyngeal erythema.  Eyes:     General: No scleral icterus. Cardiovascular:     Rate and Rhythm: Normal rate. Rhythm irregular.      Pulses: Normal pulses.     Heart sounds: Normal heart sounds.  Pulmonary:     Effort: Pulmonary effort is normal.     Breath sounds: Normal breath sounds.  Chest:     Comments: Right breast s/p lumpectomy/radiation-no sign of local recurrence, left breast benign Abdominal:     General: Abdomen is flat. Bowel sounds are normal. There is no distension.     Palpations: Abdomen is soft.     Tenderness: There is no abdominal tenderness.  Musculoskeletal:        General: No swelling.     Cervical back: Neck supple.  Lymphadenopathy:     Cervical: No cervical adenopathy.     Upper Body:     Right upper body: No supraclavicular or axillary adenopathy.     Left upper body: No supraclavicular or axillary adenopathy.  Skin:    General: Skin is warm and dry.  Findings: No rash.  Neurological:     General: No focal deficit present.     Mental Status: She is alert.  Psychiatric:        Mood and Affect: Mood normal.        Behavior: Behavior normal.     LABORATORY DATA:  CBC    Component Value Date/Time   WBC 6.5 03/01/2022 1019   WBC 8.3 12/27/2015 1524   RBC 4.48 03/01/2022 1019   RBC 4.60 12/27/2015 1524   HGB 13.2 03/01/2022 1019   HGB 12.8 07/07/2015 1226   HCT 38.8 03/01/2022 1019   HCT 38.8 07/07/2015 1226   PLT 184 03/01/2022 1019   MCV 87 03/01/2022 1019   MCV 79.4 (L) 07/07/2015 1226   MCH 29.5 03/01/2022 1019   MCH 28.7 12/27/2015 1524   MCHC 34.0 03/01/2022 1019   MCHC 33.1 12/27/2015 1524   RDW 14.7 03/01/2022 1019   RDW 17.2 (H) 07/07/2015 1226   LYMPHSABS 1.3 07/07/2015 1226   MONOABS 0.5 07/07/2015 1226   EOSABS 0.2 07/07/2015 1226   BASOSABS 0.1 07/07/2015 1226    CMP     Component Value Date/Time   NA 141 03/01/2022 1019   NA 142 07/07/2015 1226   K 4.2 03/01/2022 1019   K 4.0 07/07/2015 1226   CL 103 03/01/2022 1019   CO2 32 (H) 03/01/2022 1019   CO2 29 07/07/2015 1226   GLUCOSE 90 03/01/2022 1019   GLUCOSE 98 12/27/2015 1524    GLUCOSE 91 07/07/2015 1226   BUN 9 03/01/2022 1019   BUN 10.9 07/07/2015 1226   CREATININE 0.71 03/01/2022 1019   CREATININE 0.83 12/27/2015 1524   CREATININE 0.8 07/07/2015 1226   CALCIUM  10.1 03/01/2022 1019   CALCIUM  10.0 07/07/2015 1226   PROT 7.1 07/07/2015 1226   ALBUMIN 3.3 (L) 07/07/2015 1226   AST 22 07/07/2015 1226   ALT 18 07/07/2015 1226   ALKPHOS 127 07/07/2015 1226   BILITOT 0.52 07/07/2015 1226   GFRNONAA >90 08/23/2014 0426   GFRAA >90 08/23/2014 0426     ASSESSMENT and THERAPY PLAN:   No problem-specific Assessment & Plan notes found for this encounter.     All questions were answered. The patient knows to call the clinic with any problems, questions or concerns. We can certainly see the patient much sooner if necessary.  Total encounter time:*** minutes*in face-to-face visit time, chart review, lab review, care coordination, order entry, and documentation of the encounter time.    Morna Kendall, NP 12/13/23 10:40 AM Medical Oncology and Hematology [facility] [address] [place], [place] [address] Tel. [phone]    Fax. [phone]  *Total Encounter Time as defined by the Centers for Medicare and Medicaid Services includes, in addition to the face-to-face time of a patient visit (documented in the note above) non-face-to-face time: obtaining and reviewing outside history, ordering and reviewing medications, tests or procedures, care coordination (communications with other health care professionals or caregivers) and documentation in the medical record.

## 2023-12-14 ENCOUNTER — Telehealth: Payer: Self-pay

## 2023-12-14 NOTE — Telephone Encounter (Signed)
   Pre-operative Risk Assessment    Patient Name: Brandi Rodgers  DOB: 09-Aug-1950 MRN: [id number]   Date of last office visit: 04/30/23 JEREL BALDING, MD Date of next office visit: NONE   Request for Surgical Clearance    Procedure:  COLONOSCOPY  Date of Surgery:  Clearance 02/11/24                                Surgeon:  DR ELSIE CREE Surgeon's Group or Practice Name:  [facility] Phone number:  [phone] Fax number:  [phone]   Type of Clearance Requested:   - Medical  - Pharmacy:  Hold Apixaban  (Eliquis )     Type of Anesthesia:  PROPOFOL    Additional requests/questions:    SignedLucie DELENA Ku   12/14/2023, 1:48 PM

## 2023-12-17 ENCOUNTER — Telehealth: Payer: Self-pay | Admitting: *Deleted

## 2023-12-17 DIAGNOSIS — M25551 Pain in right hip: Secondary | ICD-10-CM | POA: Diagnosis not present

## 2023-12-17 NOTE — Telephone Encounter (Signed)
 Pt has been scheduled for a tele visit, 01/15/24 1:40.  Consent on file / medications reconciled.    Patient Consent for Virtual Visit        Brandi Rodgers has provided verbal consent on 12/17/2023 for a virtual visit (video or telephone).   CONSENT FOR VIRTUAL VISIT FOR:  Brandi Rodgers  By participating in this virtual visit I agree to the following:  I hereby voluntarily request, consent and authorize [facility] and its employed or contracted physicians, physician assistants, nurse practitioners or other licensed health care professionals (the Practitioner), to provide me with telemedicine health care services (the "Services) as deemed necessary by the treating Practitioner. I acknowledge and consent to receive the Services by the Practitioner via telemedicine. I understand that the telemedicine visit will involve communicating with the Practitioner through live audiovisual communication technology and the disclosure of certain medical information by electronic transmission. I acknowledge that I have been given the opportunity to request an in-person assessment or other available alternative prior to the telemedicine visit and am voluntarily participating in the telemedicine visit.  I understand that I have the right to withhold or withdraw my consent to the use of telemedicine in the course of my care at any time, without affecting my right to future care or treatment, and that the Practitioner or I may terminate the telemedicine visit at any time. I understand that I have the right to inspect all information obtained and/or recorded in the course of the telemedicine visit and may receive copies of available information for a reasonable fee.  I understand that some of the potential risks of receiving the Services via telemedicine include:  Delay or interruption in medical evaluation due to technological equipment failure or disruption; Information transmitted may not be  sufficient (e.g. poor resolution of images) to allow for appropriate medical decision making by the Practitioner; and/or  In rare instances, security protocols could fail, causing a breach of personal health information.  Furthermore, I acknowledge that it is my responsibility to provide information about my medical history, conditions and care that is complete and accurate to the best of my ability. I acknowledge that Practitioner's advice, recommendations, and/or decision may be based on factors not within their control, such as incomplete or inaccurate data provided by me or distortions of diagnostic images or specimens that may result from electronic transmissions. I understand that the practice of medicine is not an exact science and that Practitioner makes no warranties or guarantees regarding treatment outcomes. I acknowledge that a copy of this consent can be made available to me via my patient portal [facility] MyChart), or I can request a printed copy by calling the office of [facility].    I understand that my insurance will be billed for this visit.   I have read or had this consent read to me. I understand the contents of this consent, which adequately explains the benefits and risks of the Services being provided via telemedicine.  I have been provided ample opportunity to ask questions regarding this consent and the Services and have had my questions answered to my satisfaction. I give my informed consent for the services to be provided through the use of telemedicine in my medical care

## 2023-12-17 NOTE — Telephone Encounter (Signed)
 Pt has been scheduled for a tele visit, 01/15/24 1:40.  Consent on file / medications reconciled.

## 2023-12-17 NOTE — Telephone Encounter (Signed)
 Patient with diagnosis of A Fib on Eliquis  for anticoagulation.    Procedure: colonoscopy Date of procedure: 02/11/24   CHA2DS2-VASc Score = 4  This indicates a 4.8% annual risk of stroke. The patient's score is based upon: CHF History: 0 HTN History: 1 Diabetes History: 0 Stroke History: 0 Vascular Disease History: 1 Age Score: 1 Gender Score: 1    CrCl 94 ml/min using adj body weight Platelet count 207K  Per office protocol, patient can hold Eliquis  for 2 days prior to procedure.    **This guidance is not considered finalized until pre-operative APP has relayed final recommendations.**

## 2023-12-17 NOTE — Telephone Encounter (Signed)
   Name: Brandi Rodgers  DOB: [birth year]  MRN: [id number]  Primary Cardiologist: Jerel Balding, MD   Preoperative team, please contact this patient and set up a phone call appointment for further preoperative risk assessment. Please obtain consent and complete medication review. Thank you for your help.  I confirm that guidance regarding antiplatelet and oral anticoagulation therapy has been completed and, if necessary, noted below.  Per office protocol, patient can hold Eliquis  for 2 days prior to procedure.   I also confirmed the patient resides in the state of [place] . As per [facility] Medical Board telemedicine laws, the patient must reside in the state in which the provider is licensed.   Josefa CHRISTELLA Beauvais, NP 12/17/2023, 1:11 PM [facility]

## 2024-01-13 DIAGNOSIS — I480 Paroxysmal atrial fibrillation: Secondary | ICD-10-CM | POA: Diagnosis not present

## 2024-01-13 DIAGNOSIS — I10 Essential (primary) hypertension: Secondary | ICD-10-CM | POA: Diagnosis not present

## 2024-01-13 DIAGNOSIS — I2510 Atherosclerotic heart disease of native coronary artery without angina pectoris: Secondary | ICD-10-CM | POA: Diagnosis not present

## 2024-01-15 ENCOUNTER — Ambulatory Visit: Attending: Cardiology

## 2024-01-15 DIAGNOSIS — Z01810 Encounter for preprocedural cardiovascular examination: Secondary | ICD-10-CM

## 2024-01-15 NOTE — Progress Notes (Signed)
 Virtual Visit via Telephone Note   Because of CIJI BOSTON co-morbid illnesses, she is at least at moderate risk for complications without adequate follow up.  This format is felt to be most appropriate for this patient at this time.  Due to technical limitations with video connection (technology), today's appointment will be conducted as an audio only telehealth visit, and Brandi Rodgers verbally agreed to proceed in this manner.   All issues noted in this document were discussed and addressed.  No physical exam could be performed with this format.  Evaluation Performed:  Preoperative cardiovascular risk assessment _____________   Date:  01/15/2024   Patient ID:  Brandi Rodgers, DOB Apr 02, 1951, MRN [id number] Patient Location:  Home Provider location:   Office  Primary Care Provider:  Espinoza, Alejandra, DO Primary Cardiologist:  Jerel Balding, MD  Chief Complaint / Patient Profile   73 y.o. y/o female with a h/o coronary artery disease, paroxysmal atrial fibrillation, essential hypertension who is pending colonoscopy and presents today for telephonic preoperative cardiovascular risk assessment.  History of Present Illness    Brandi Rodgers is a 73 y.o. female who presents via audio/video conferencing for a telehealth visit today.  Pt was last seen in cardiology clinic on 04/30/2023 by Dr. Balding.  At that time Brandi Rodgers was doing well .  The patient is now pending procedure as outlined above. Since her last visit, she continues to be stable from a cardiac standpoint.  Today she denies chest pain, shortness of breath, lower extremity edema, fatigue, palpitations, melena, hematuria, hemoptysis, diaphoresis, weakness, presyncope, syncope, orthopnea, and PND.   Past Medical History    Past Medical History:  Diagnosis Date   Arthritis    cervical and lumbar spine   Breast cancer of lower-outer quadrant of right female breast (HCC) 07/02/2015    Degenerative lumbar disc    Dental crowns present    GERD (gastroesophageal reflux disease)    Hypercholesterolemia    borderline - no current med.   Jaw clicking    Migraines    Osteoarthritis    bilateral knee   Paroxysmal atrial fibrillation (HCC)    last episode was 01/2014   Radiation 09/07/15-10/26/15   right breast 50.4 Gy, boost to 10 Gy   Sleep apnea    uses mouth guard at night; no CPAP   Past Surgical History:  Procedure Laterality Date   CARPAL TUNNEL RELEASE Right 05/13/2003   CATARACT EXTRACTION Bilateral 2022   Stonseipher   DILATION AND CURETTAGE OF UTERUS     KNEE ARTHROSCOPY Right 04/06/2003   KNEE ARTHROSCOPY Left 11/30/2003   LEFT HEART CATH AND CORONARY ANGIOGRAPHY N/A 03/02/2022   Procedure: LEFT HEART CATH AND CORONARY ANGIOGRAPHY;  Surgeon: Verlin Lonni BIRCH, MD;  Location: [facility];  Service: Cardiovascular;  Laterality: N/A;   RADIOACTIVE SEED GUIDED PARTIAL MASTECTOMY WITH AXILLARY SENTINEL LYMPH NODE BIOPSY Right 07/16/2015   Procedure: RADIOACTIVE SEED GUIDED PARTIAL MASTECTOMY WITH AXILLARY SENTINEL LYMPH NODE BIOPSY;  Surgeon: Elon Pacini, MD;  Location: [facility];  Service: General;  Laterality: Right;   TOTAL KNEE ARTHROPLASTY Left 03/23/2014   Procedure: LEFT TOTAL KNEE ARTHROPLASTY ;  Surgeon: Dempsey Melodi GAILS, MD;  Location: [facility];  Service: Orthopedics;  Laterality: Left;   TOTAL KNEE ARTHROPLASTY Right 08/21/2014   Procedure: RIGHT TOTAL KNEE ARTHROPLASTY;  Surgeon: Dempsey Melodi, MD;  Location: [facility];  Service: Orthopedics;  Laterality: Right;    Allergies  Allergies  Allergen Reactions   Methocarbamol  Hives   Augmentin [Amoxicillin-Pot Clavulanate] Diarrhea   Nsaids     On Blood Thinner   Other     Other Reaction(s): Not available    Home Medications    Prior to Admission medications   Medication Sig Start Date End Date Taking? Authorizing Provider  acetaminophen  (TYLENOL ) 325 MG tablet Take 650  mg by mouth every 6 (six) hours as needed for moderate pain.    [provider]  apixaban  (ELIQUIS ) 5 MG TABS tablet Take 1 tablet (5 mg total) by mouth 2 (two) times daily. 04/30/23   Croitoru, Mihai, MD  atenolol  (TENORMIN ) 100 MG tablet Take 1 tablet (100 mg total) by mouth at bedtime. 06/13/23 12/17/23  Croitoru, Mihai, MD  bismuth subsalicylate (PEPTO BISMOL) 262 MG chewable tablet Chew 524 mg by mouth as needed for diarrhea or loose stools or indigestion.    [provider]  calcium  carbonate (TUMS - DOSED IN MG ELEMENTAL CALCIUM ) 500 MG chewable tablet Chew 1 tablet by mouth daily as needed for indigestion or heartburn.    [provider]  Calcium -Vitamin D-Vitamin K (VIACTIV PO) Take 1 tablet by mouth daily.    [provider]  carboxymethylcellulose (REFRESH PLUS) 0.5 % SOLN Place 1 drop into both eyes 3 (three) times daily as needed (Dry Eyes).    [provider]  gabapentin  (NEURONTIN ) 600 MG tablet Take 1 tablet (600 mg total) by mouth 2 (two) times daily. 12/08/19   Gudena, Vinay, MD  Multiple Vitamin (MULTI-VITAMIN DAILY PO) Take 1 tablet by mouth daily.    [provider]  predniSONE (DELTASONE) 5 MG tablet Take 1 dose pk by oral route as directed for 6 days. 12/17/23   [provider]  rizatriptan  (MAXALT -MLT) 10 MG disintegrating tablet Take 10 mg by mouth as needed for migraine. Reported on 09/14/2015    [provider]  rosuvastatin  (CRESTOR ) 10 MG tablet Take 1 tablet (10 mg total) by mouth daily. 04/30/23   Croitoru, Mihai, MD  valACYclovir (VALTREX) 1000 MG tablet Take 1,000 mg by mouth 3 (three) times daily as needed (Fever blisters). 11/16/21   [provider]    Physical Exam    Vital Signs:  Brandi Rodgers does not have vital signs available for review today.  Given telephonic nature of communication, physical exam is limited. AAOx3. NAD. Normal affect.  Speech and respirations are  unlabored.  Accessory Clinical Findings    None  Assessment & Plan    1.  Preoperative Cardiovascular Risk Assessment: COLONOSCOPY   Date of Surgery:  Clearance 02/11/24                                  Surgeon:  DR ELSIE CREE Surgeon's Group or Practice Name:  [facility] Phone number:  [phone] Fax number:  [phone]      Primary Cardiologist: Jerel Balding, MD  Chart reviewed as part of pre-operative protocol coverage. Given past medical history and time since last visit, based on ACC/AHA guidelines, Brandi Rodgers would be at acceptable risk for the planned procedure without further cardiovascular testing.   Her RCRI is moderate risk, 6.6% risk of major cardiac event.  She is able to complete greater than 4 METS of physical activity.  Patient was advised that if she develops new symptoms prior to surgery to contact our office to arrange a follow-up appointment.  He verbalized  understanding.  Per office protocol, patient can hold Eliquis  for 2 days prior to procedure.   I will route this recommendation to the requesting party via Epic fax function and remove from pre-op pool.      Time:   Today, I have spent 5 minutes with the patient with telehealth technology discussing medical history, symptoms, and management plan.  I spent 10 minutes reviewing patient's past cardiac history and cardiac medications.    Josefa CHRISTELLA Beauvais, NP  01/15/2024, 8:02 AM

## 2024-03-10 ENCOUNTER — Telehealth: Payer: Self-pay | Admitting: Cardiovascular Disease

## 2024-03-10 DIAGNOSIS — I4811 Longstanding persistent atrial fibrillation: Secondary | ICD-10-CM

## 2024-03-10 NOTE — Telephone Encounter (Signed)
 STAT if HR is under 50 or over 120 (normal HR is 60-100 beats per minute)  What is your heart rate?   Not available at the time  Do you have a log of your heart rate readings (document readings)?   No  Do you have any other symptoms? Headache, racing heart  Patient stated she had a colonoscopy today and her heart rate went very high.  Patient stated Dr. Burnette at [facility] will be forwarding information.  Patient noted she had stopped Eliquis  for 2 days prior to procedure.

## 2024-03-10 NOTE — Telephone Encounter (Signed)
 Please set up a 3-day arrhythmia monitor to assess atrial fibrillation rate control.

## 2024-03-10 NOTE — Telephone Encounter (Signed)
 S/w the patient and she reports that they were unable to complete the colonoscopy- they started it, but had to stop due to hr being so high= 140's  She reports that she can feel her afib, where usually she cannot even tell that she is in it.   She denies shortness of breath or dizziness.  She has an appt in January with Dr Francyne, but her GI provider said she should be seen sooner. She reports that she will restart Eliquis  this evening.   Advised her to stay well hydrated- fluid with electrolytes. Informed that I will give this info to her provider and call her back with recommendations. Given ER precautions. She verbalized understanding.

## 2024-03-12 ENCOUNTER — Ambulatory Visit: Attending: Cardiovascular Disease

## 2024-03-12 DIAGNOSIS — I4811 Longstanding persistent atrial fibrillation: Secondary | ICD-10-CM

## 2024-03-12 NOTE — Progress Notes (Unsigned)
 Enrolled patient for a 3 day Zio XT monitor to be mailed to patients home

## 2024-03-12 NOTE — Telephone Encounter (Signed)
 3 day zio ordered Update sent to patient in MyChart

## 2024-03-12 NOTE — Telephone Encounter (Signed)
 I would still go ahead with a 3-day ZIO.  We might not be getting as good rate control as we think.

## 2024-03-13 ENCOUNTER — Other Ambulatory Visit: Payer: Self-pay | Admitting: Cardiovascular Disease

## 2024-03-14 NOTE — Telephone Encounter (Signed)
 Prescription refill request for Eliquis  received. Indication:   A-Fib Last office visit:  04/28/2023    Scr:   0.73  last labs on 01/23/2023 Age:   73 yrs. Weight:  120.5 kg  Pt meet protocol and has an upcoming appt on 05/20/2024, but pt need update on labs. Can pt get a refill on medication? Please advise

## 2024-03-18 NOTE — Telephone Encounter (Signed)
 Labs were in care everywhere from PCP note in Oct

## 2024-03-27 ENCOUNTER — Ambulatory Visit: Payer: Self-pay | Admitting: Cardiovascular Disease

## 2024-03-27 DIAGNOSIS — I4811 Longstanding persistent atrial fibrillation: Secondary | ICD-10-CM

## 2024-03-28 MED ORDER — ATENOLOL 25 MG PO TABS: 25.0000 mg | ORAL_TABLET | Freq: Every morning | ORAL | 3 refills | Status: AC

## 2024-03-28 NOTE — Telephone Encounter (Signed)
 Croitoru, Mihai, MD to Me  Chet Mad, DO (Selected Message)    03/27/24  9:30 AM Result Note Periods of RVR, although overall A-fib is well rate controlled.  Let's add another 25 mg of atenolol  every morning, please; continue taking atenolol  100 mg at night.  RX sent to pharmacy- [facility] message sent to patient.

## 2024-05-20 ENCOUNTER — Ambulatory Visit: Attending: Cardiovascular Disease | Admitting: Cardiovascular Disease

## 2024-05-20 ENCOUNTER — Encounter: Payer: Self-pay | Admitting: Cardiovascular Disease

## 2024-05-20 VITALS — BP 139/92 | HR 93 | Ht 68.0 in | Wt 270.3 lb

## 2024-05-20 DIAGNOSIS — I4811 Longstanding persistent atrial fibrillation: Secondary | ICD-10-CM | POA: Diagnosis not present

## 2024-05-20 DIAGNOSIS — I10 Essential (primary) hypertension: Secondary | ICD-10-CM | POA: Diagnosis not present

## 2024-05-20 DIAGNOSIS — I480 Paroxysmal atrial fibrillation: Secondary | ICD-10-CM

## 2024-05-20 DIAGNOSIS — I2542 Coronary artery dissection: Secondary | ICD-10-CM | POA: Diagnosis not present

## 2024-05-20 DIAGNOSIS — G43C0 Periodic headache syndromes in child or adult, not intractable: Secondary | ICD-10-CM

## 2024-05-20 DIAGNOSIS — D6869 Other thrombophilia: Secondary | ICD-10-CM | POA: Diagnosis not present

## 2024-05-20 DIAGNOSIS — Z923 Personal history of irradiation: Secondary | ICD-10-CM | POA: Diagnosis not present

## 2024-05-20 DIAGNOSIS — E7800 Pure hypercholesterolemia, unspecified: Secondary | ICD-10-CM | POA: Diagnosis not present

## 2024-05-20 DIAGNOSIS — I2721 Secondary pulmonary arterial hypertension: Secondary | ICD-10-CM | POA: Diagnosis not present

## 2024-05-20 DIAGNOSIS — I4819 Other persistent atrial fibrillation: Secondary | ICD-10-CM

## 2024-05-20 DIAGNOSIS — G4733 Obstructive sleep apnea (adult) (pediatric): Secondary | ICD-10-CM | POA: Diagnosis not present

## 2024-05-20 NOTE — Progress Notes (Signed)
 "     Cardiology Office Note    Date:  05/25/2024   ID:  YOSHI VICENCIO, DOB August 18, 1950, MRN [id number]  PCP:  Chet Mad, DO  Cardiologist:   Jerel Balding, MD   Chief Complaint  Patient presents with   Atrial Fibrillation      History of Present Illness:  Brandi Rodgers is a 74 y.o. female with history of spontaneous coronary artery dissection (distal LAD October 2023, managed medically, no other CAD by angiography), longstanding persistent atrial fibrillation, on anticoagulation with Eliquis  without bleeding complications, morbid obesity, sleep apnea (uses a jaw advancement device), remote radiation therapy for right breast cancer (2017), migraine headaches for which she takes atenolol .  The T wave abnormalities that were seen when she had her LAD artery dissection have resolved on today's ECG.  She is feeling well and has no current cardiovascular complaints. The patient specifically denies any chest pain at rest or with exertion, dyspnea at rest or with exertion, orthopnea, paroxysmal nocturnal dyspnea, syncope, palpitations, focal neurological deficits, intermittent claudication, lower extremity edema, unexplained weight gain, cough, hemoptysis or wheezing.  Denies daytime hypersomnolence.  She has problems with right hip pain and is seeing Dr. Melodi.  She had a fall last August, without serious bleeding or head impact.  She presented for colonoscopy late last year, but was canceled because her ventricular rates were too fast.  An arrhythmia monitor showed that although her overall rate control was very good she had periods of marked RVR.  Her overall dose of atenolol  was increased to 125 mg daily (25 mg in the morning and 100 mg at night).  Her blood pressure is a little high today initially 152/100, when rechecked 239/92.  At home her blood pressure is consistently in the 130s/80s.  Metabolic control is excellent with an HDL cholesterol 54, LDL cholesterol of 63,  normal triglycerides 89, hemoglobin A1c 5.6%, normal creatinine at 0.71    Echocardiogram performed in October 2015 showed normal left ventricular wall thickness and ejection fraction, normal left atrial size, but did show mild pulmonary hypertension with an estimated systolic PA pressure 41 mmHg. Has a history of right breast cancer status post surgery and right breast radiation therapy. She does not have angina pectoris or any known coronary or peripheral vascular obstructive lesions. She has mild hyperlipidemia which has been managed with diet alone. She has had problems with edema in the past, but uses furosemide  extremely rarely.  Presented with chest discomfort and atrial fibrillation rapid ventricular sponsor in October 2023, with new ECG changes and apical perfusion defect, cardiac catheterization showed a dissection of the distal end of the artery which was managed medically.  Past Medical History:  Diagnosis Date   Arthritis    cervical and lumbar spine   Breast cancer of lower-outer quadrant of right female breast (HCC) 07/02/2015   Degenerative lumbar disc    Dental crowns present    GERD (gastroesophageal reflux disease)    Hypercholesterolemia    borderline - no current med.   Jaw clicking    Migraines    Osteoarthritis    bilateral knee   Paroxysmal atrial fibrillation (HCC)    last episode was 01/2014   Radiation 09/07/15-10/26/15   right breast 50.4 Gy, boost to 10 Gy   Sleep apnea    uses mouth guard at night; no CPAP    Past Surgical History:  Procedure Laterality Date   CARPAL TUNNEL RELEASE Right 05/13/2003   CATARACT EXTRACTION Bilateral 2022  Stonseipher   DILATION AND CURETTAGE OF UTERUS     KNEE ARTHROSCOPY Right 04/06/2003   KNEE ARTHROSCOPY Left 11/30/2003   LEFT HEART CATH AND CORONARY ANGIOGRAPHY N/A 03/02/2022   Procedure: LEFT HEART CATH AND CORONARY ANGIOGRAPHY;  Surgeon: Verlin Lonni BIRCH, MD;  Location: [facility];  Service:  Cardiovascular;  Laterality: N/A;   RADIOACTIVE SEED GUIDED PARTIAL MASTECTOMY WITH AXILLARY SENTINEL LYMPH NODE BIOPSY Right 07/16/2015   Procedure: RADIOACTIVE SEED GUIDED PARTIAL MASTECTOMY WITH AXILLARY SENTINEL LYMPH NODE BIOPSY;  Surgeon: Elon Pacini, MD;  Location: [facility];  Service: General;  Laterality: Right;   TOTAL KNEE ARTHROPLASTY Left 03/23/2014   Procedure: LEFT TOTAL KNEE ARTHROPLASTY ;  Surgeon: Dempsey Melodi GAILS, MD;  Location: [facility];  Service: Orthopedics;  Laterality: Left;   TOTAL KNEE ARTHROPLASTY Right 08/21/2014   Procedure: RIGHT TOTAL KNEE ARTHROPLASTY;  Surgeon: Dempsey Melodi, MD;  Location: [facility];  Service: Orthopedics;  Laterality: Right;    Current Medications: Outpatient Medications Prior to Visit  Medication Sig Dispense Refill   acetaminophen  (TYLENOL ) 325 MG tablet Take 650 mg by mouth every 6 (six) hours as needed for moderate pain.     apixaban  (ELIQUIS ) 5 MG TABS tablet TAKE 1 TABLET BY MOUTH TWICE A DAY 180 tablet 1   atenolol  (TENORMIN ) 100 MG tablet Take 1 tablet (100 mg total) by mouth at bedtime. 90 tablet 3   atenolol  (TENORMIN ) 25 MG tablet Take 1 tablet (25 mg total) by mouth every morning. 90 tablet 3   Calcium -Vitamin D-Vitamin K (VIACTIV PO) Take 1 tablet by mouth daily.     carboxymethylcellulose (REFRESH PLUS) 0.5 % SOLN Place 1 drop into both eyes 3 (three) times daily as needed (Dry Eyes).     gabapentin  (NEURONTIN ) 600 MG tablet 1.5 tablets Orally Once at night; Duration: 90 days     Multiple Vitamin (MULTI-VITAMIN DAILY PO) Take 1 tablet by mouth daily.     predniSONE (DELTASONE) 5 MG tablet Take 1 dose pk by oral route as directed for 6 days.     rizatriptan  (MAXALT -MLT) 10 MG disintegrating tablet Take 10 mg by mouth as needed for migraine. Reported on 09/14/2015     rosuvastatin  (CRESTOR ) 10 MG tablet TAKE 1 TABLET BY MOUTH EVERY DAY 90 tablet 0   bismuth subsalicylate (PEPTO BISMOL) 262 MG chewable tablet Chew 524 mg  by mouth as needed for diarrhea or loose stools or indigestion. (Patient not taking: Reported on 05/20/2024)     calcium  carbonate (TUMS - DOSED IN MG ELEMENTAL CALCIUM ) 500 MG chewable tablet Chew 1 tablet by mouth daily as needed for indigestion or heartburn. (Patient not taking: Reported on 05/20/2024)     valACYclovir (VALTREX) 1000 MG tablet Take 1,000 mg by mouth 3 (three) times daily as needed (Fever blisters). (Patient not taking: Reported on 05/20/2024)     gabapentin  (NEURONTIN ) 600 MG tablet Take 1 tablet (600 mg total) by mouth 2 (two) times daily.     No facility-administered medications prior to visit.     Allergies:   Methocarbamol , Augmentin [amoxicillin-pot clavulanate], Nsaids, and Other       Family History:  The patient's family history includes Lung cancer in her mother; Thyroid cancer in her mother.   ROS:   Please see the history of present illness.    ROS all other systems are reviewed and are negative   PHYSICAL EXAM:   VS:  BP (!) 139/92   Pulse 93   Ht  5' 8 (1.727 m)   Wt 270 lb 4.8 oz (122.6 kg)   SpO2 95%   BMI 41.10 kg/m      General: Alert, oriented x3, no distress, severely obese Head: no evidence of trauma, PERRL, EOMI, no exophtalmos or lid lag, no myxedema, no xanthelasma; normal ears, nose and oropharynx Neck: normal jugular venous pulsations and no hepatojugular reflux; brisk carotid pulses without delay and no carotid bruits Chest: clear to auscultation, no signs of consolidation by percussion or palpation, normal fremitus, symmetrical and full respiratory excursions Cardiovascular: normal position and quality of the apical impulse, irregular rhythm, normal first and second heart sounds, no murmurs, rubs or gallops Abdomen: no tenderness or distention, no masses by palpation, no abnormal pulsatility or arterial bruits, normal bowel sounds, no hepatosplenomegaly Extremities: no clubbing, cyanosis or edema; 2+ radial, ulnar and brachial pulses  bilaterally; 2+ right femoral, posterior tibial and dorsalis pedis pulses; 2+ left femoral, posterior tibial and dorsalis pedis pulses; no subclavian or femoral bruits Neurological: grossly nonfocal Psych: Normal mood and affect      Wt Readings from Last 3 Encounters:  05/20/24 270 lb 4.8 oz (122.6 kg)  12/13/23 265 lb 9.6 oz (120.5 kg)  04/30/23 261 lb (118.4 kg)      Studies/Labs Reviewed:   EKG:    EKG Interpretation Date/Time:  Tuesday May 20 2024 16:46:37 EST Ventricular Rate:  93 PR Interval:    QRS Duration:  78 QT Interval:  328 QTC Calculation: 407 R Axis:   -8  Text Interpretation: Atrial fibrillation When compared with ECG of 30-Apr-2023 09:58, No significant change since last tracing Confirmed by Tawania Daponte ([id number]) on 05/20/2024 5:04:44 PM        Recent Labs: No results found for requested labs within last 365 days.   Lipid Panel     Component Value Date/Time   CHOL 128 08/06/2023 1017   TRIG 98 08/06/2023 1017   HDL 53 08/06/2023 1017   CHOLHDL 2.4 08/06/2023 1017   LDLCALC 57 08/06/2023 1017     02/18/2024 Cholesterol 134, triglycerides 89, HDL 54, LDL 63 Hemoglobin 86.4, hemoglobin A1c 5.6%, ALT 15, potassium 4.1  ASSESSMENT:    1. Spontaneous dissection of coronary artery   2. Longstanding persistent atrial fibrillation (HCC)   3. Acquired thrombophilia   4. Morbid obesity (HCC)   5. OSA (obstructive sleep apnea)   6. PAH (pulmonary artery hypertension) (HCC)   7. Essential hypertension   8. Hypercholesterolemia   9. Periodic headache syndrome, not intractable   10. History of radiation therapy of breast       PLAN:  In order of problems listed above:  Spontaneous coronary dissection: No evidence of atherosclerosis on coronary angiography, but had a distal LAD dissection managed medically and apparently healed.  Her angina was primarily right axillary and mid chest discomfort.   Afib: Persistent for minimum of 5 years now  with.  Has good rate control.  On appropriate anticoagulation.  No plan for cardioversion or antiarrhythmics.  May need to take an extra small dose of atenolol  before medical procedures such as a colonoscopy to avoid RVR related to hyperadrenergic state. Eliquis : Has not had bleeding complications and has had 1 fall this year, thankfully without serious injury.  CHA2DS2-VASc score of 2 (age and gender).   Obesity: Unfortunately has gained weight is now in morbidly obese range. OSA: Treated with a dental device.  This may not be sufficient with a BMI of 41. PAH: This was mild  on a previous echo.  She denies shortness of breath.  She denies daytime hypersomnolence and believes that her sleep apnea is well treated.  Does not have lower extremity edema or other signs of right heart failure. HTN: Elevated today but usually well-controlled at home.  Asked her to keep a log and let me know if her blood pressure is over 140/90. HLP: All lipid parameters in target range on statin. Migraines: Good prevention with beta-blocker. Hx R breast XRT: Unlikely to be causally related to dissection of the distal LAD artery.    Medication Adjustments/Labs and Tests Ordered: Current medicines are reviewed at length with the patient today.  Concerns regarding medicines are outlined above.  Medication changes, Labs and Tests ordered today are listed in the Patient Instructions below. Patient Instructions  Medication Instructions:  Your physician recommends that you continue on your current medications as directed. Please refer to the Current Medication list given to you today.  *If you need a refill on your cardiac medications before your next appointment, please call your pharmacy*  Lab Work: none If you have labs (blood work) drawn today and your tests are completely normal, you will receive your results only by: MyChart Message (if you have MyChart) OR A paper copy in the mail If you have any lab test that is  abnormal or we need to change your treatment, we will call you to review the results.  Testing/Procedures: none  Follow-Up: At [facility], you and your health needs are our priority.  As part of our continuing mission to provide you with exceptional heart care, our providers are all part of one team.  This team includes your primary Cardiologist (physician) and Advanced Practice Providers or APPs (Physician Assistants and Nurse Practitioners) who all work together to provide you with the care you need, when you need it.  Your next appointment:   12 month(s)  Provider:   Jerel Balding, MD    We recommend signing up for the patient portal called MyChart.  Sign up information is provided on this After Visit Summary.  MyChart is used to connect with patients for Virtual Visits (Telemedicine).  Patients are able to view lab/test results, encounter notes, upcoming appointments, etc.  Non-urgent messages can be sent to your provider as well.   To learn more about what you can do with MyChart, go to [email or web address].   Other Instructions          Signed, Jerel Balding, MD  05/25/2024 11:30 AM    [facility] [address] [place], [place], [place]  [address] Phone: [phone]; Fax: [phone]   "

## 2024-05-20 NOTE — Patient Instructions (Signed)
 Medication Instructions:  Your physician recommends that you continue on your current medications as directed. Please refer to the Current Medication list given to you today.  *If you need a refill on your cardiac medications before your next appointment, please call your pharmacy*  Lab Work: none If you have labs (blood work) drawn today and your tests are completely normal, you will receive your results only by: MyChart Message (if you have MyChart) OR A paper copy in the mail If you have any lab test that is abnormal or we need to change your treatment, we will call you to review the results.  Testing/Procedures: none  Follow-Up: At [facility], you and your health needs are our priority.  As part of our continuing mission to provide you with exceptional heart care, our providers are all part of one team.  This team includes your primary Cardiologist (physician) and Advanced Practice Providers or APPs (Physician Assistants and Nurse Practitioners) who all work together to provide you with the care you need, when you need it.  Your next appointment:   12 month(s)  Provider:   Jerel Balding, MD    We recommend signing up for the patient portal called MyChart.  Sign up information is provided on this After Visit Summary.  MyChart is used to connect with patients for Virtual Visits (Telemedicine).  Patients are able to view lab/test results, encounter notes, upcoming appointments, etc.  Non-urgent messages can be sent to your provider as well.   To learn more about what you can do with MyChart, go to [email or web address].   Other Instructions

## 2024-06-09 ENCOUNTER — Other Ambulatory Visit: Payer: Self-pay | Admitting: Cardiovascular Disease

## 2024-06-16 ENCOUNTER — Encounter: Payer: Self-pay | Admitting: Cardiovascular Disease

## 2024-06-16 NOTE — Telephone Encounter (Signed)
 Do you feel that she will need further blood pressure management before her hip replacement?

## 2024-07-16 ENCOUNTER — Ambulatory Visit (HOSPITAL_COMMUNITY): Admit: 2024-07-16 | Admitting: Orthopedic Surgery

## 2024-09-01 ENCOUNTER — Encounter (INDEPENDENT_AMBULATORY_CARE_PROVIDER_SITE_OTHER): Admitting: Ophthalmology

## 2024-12-15 ENCOUNTER — Inpatient Hospital Stay: Admitting: Adult Health
